# Patient Record
Sex: Female | Born: 1980 | Race: Black or African American | Hispanic: No | Marital: Single | Smoking: Never smoker
Health system: Southern US, Community
[De-identification: ages and names within clinical notes are randomized; demographics above are authoritative.]

## PROBLEM LIST (undated history)

## (undated) DIAGNOSIS — F988 Other specified behavioral and emotional disorders with onset usually occurring in childhood and adolescence: Secondary | ICD-10-CM

## (undated) DIAGNOSIS — A549 Gonococcal infection, unspecified: Secondary | ICD-10-CM

## (undated) DIAGNOSIS — B379 Candidiasis, unspecified: Secondary | ICD-10-CM

## (undated) DIAGNOSIS — M797 Fibromyalgia: Secondary | ICD-10-CM

## (undated) DIAGNOSIS — N760 Acute vaginitis: Secondary | ICD-10-CM

## (undated) DIAGNOSIS — D219 Benign neoplasm of connective and other soft tissue, unspecified: Secondary | ICD-10-CM

## (undated) DIAGNOSIS — A749 Chlamydial infection, unspecified: Secondary | ICD-10-CM

## (undated) DIAGNOSIS — B9689 Other specified bacterial agents as the cause of diseases classified elsewhere: Secondary | ICD-10-CM

## (undated) DIAGNOSIS — M779 Enthesopathy, unspecified: Secondary | ICD-10-CM

## (undated) DIAGNOSIS — Z8619 Personal history of other infectious and parasitic diseases: Secondary | ICD-10-CM

## (undated) DIAGNOSIS — R87629 Unspecified abnormal cytological findings in specimens from vagina: Secondary | ICD-10-CM

## (undated) DIAGNOSIS — IMO0002 Reserved for concepts with insufficient information to code with codable children: Secondary | ICD-10-CM

## (undated) DIAGNOSIS — M719 Bursopathy, unspecified: Secondary | ICD-10-CM

## (undated) DIAGNOSIS — D649 Anemia, unspecified: Secondary | ICD-10-CM

## (undated) DIAGNOSIS — A599 Trichomoniasis, unspecified: Secondary | ICD-10-CM

## (undated) DIAGNOSIS — R87619 Unspecified abnormal cytological findings in specimens from cervix uteri: Secondary | ICD-10-CM

## (undated) HISTORY — DX: Gonococcal infection, unspecified: A54.9

## (undated) HISTORY — DX: Other specified behavioral and emotional disorders with onset usually occurring in childhood and adolescence: F98.8

## (undated) HISTORY — PX: WISDOM TOOTH EXTRACTION: SHX21

## (undated) HISTORY — DX: Anemia, unspecified: D64.9

## (undated) HISTORY — DX: Benign neoplasm of connective and other soft tissue, unspecified: D21.9

## (undated) HISTORY — DX: Other specified bacterial agents as the cause of diseases classified elsewhere: B96.89

## (undated) HISTORY — DX: Other specified bacterial agents as the cause of diseases classified elsewhere: N76.0

## (undated) HISTORY — DX: Personal history of other infectious and parasitic diseases: Z86.19

## (undated) HISTORY — DX: Trichomoniasis, unspecified: A59.9

## (undated) HISTORY — DX: Bursopathy, unspecified: M71.9

## (undated) HISTORY — DX: Enthesopathy, unspecified: M77.9

## (undated) HISTORY — DX: Candidiasis, unspecified: B37.9

## (undated) HISTORY — DX: Chlamydial infection, unspecified: A74.9

---

## 1999-02-08 ENCOUNTER — Other Ambulatory Visit: Admission: RE | Admit: 1999-02-08 | Discharge: 1999-02-08 | Payer: Self-pay | Admitting: Obstetrics and Gynecology

## 1999-11-16 ENCOUNTER — Encounter: Payer: Self-pay | Admitting: Emergency Medicine

## 1999-11-16 ENCOUNTER — Emergency Department (HOSPITAL_COMMUNITY): Admission: EM | Admit: 1999-11-16 | Discharge: 1999-11-16 | Payer: Self-pay | Admitting: Emergency Medicine

## 2000-02-21 ENCOUNTER — Other Ambulatory Visit: Admission: RE | Admit: 2000-02-21 | Discharge: 2000-02-21 | Payer: Self-pay | Admitting: Obstetrics and Gynecology

## 2000-02-21 ENCOUNTER — Other Ambulatory Visit: Admission: RE | Admit: 2000-02-21 | Discharge: 2000-02-21 | Payer: Self-pay

## 2000-04-11 ENCOUNTER — Ambulatory Visit (HOSPITAL_COMMUNITY): Admission: RE | Admit: 2000-04-11 | Discharge: 2000-04-11 | Payer: Self-pay | Admitting: Orthopedic Surgery

## 2000-04-11 ENCOUNTER — Encounter: Payer: Self-pay | Admitting: Orthopedic Surgery

## 2001-04-15 ENCOUNTER — Other Ambulatory Visit: Admission: RE | Admit: 2001-04-15 | Discharge: 2001-04-15 | Payer: Self-pay | Admitting: Obstetrics and Gynecology

## 2001-11-22 ENCOUNTER — Emergency Department (HOSPITAL_COMMUNITY): Admission: EM | Admit: 2001-11-22 | Discharge: 2001-11-22 | Payer: Self-pay | Admitting: Emergency Medicine

## 2001-11-23 ENCOUNTER — Ambulatory Visit (HOSPITAL_COMMUNITY): Admission: RE | Admit: 2001-11-23 | Discharge: 2001-11-23 | Payer: Self-pay | Admitting: Emergency Medicine

## 2001-11-23 ENCOUNTER — Encounter: Payer: Self-pay | Admitting: Emergency Medicine

## 2002-04-15 ENCOUNTER — Other Ambulatory Visit: Admission: RE | Admit: 2002-04-15 | Discharge: 2002-04-15 | Payer: Self-pay | Admitting: Obstetrics and Gynecology

## 2002-11-22 ENCOUNTER — Inpatient Hospital Stay (HOSPITAL_COMMUNITY): Admission: AD | Admit: 2002-11-22 | Discharge: 2002-11-22 | Payer: Self-pay | Admitting: Obstetrics & Gynecology

## 2003-07-06 ENCOUNTER — Emergency Department (HOSPITAL_COMMUNITY): Admission: EM | Admit: 2003-07-06 | Discharge: 2003-07-06 | Payer: Self-pay | Admitting: Emergency Medicine

## 2003-10-26 ENCOUNTER — Emergency Department (HOSPITAL_COMMUNITY): Admission: EM | Admit: 2003-10-26 | Discharge: 2003-10-26 | Payer: Self-pay | Admitting: Emergency Medicine

## 2003-11-02 ENCOUNTER — Emergency Department (HOSPITAL_COMMUNITY): Admission: EM | Admit: 2003-11-02 | Discharge: 2003-11-02 | Payer: Self-pay | Admitting: Emergency Medicine

## 2004-12-28 ENCOUNTER — Other Ambulatory Visit: Admission: RE | Admit: 2004-12-28 | Discharge: 2004-12-28 | Payer: Self-pay | Admitting: Obstetrics and Gynecology

## 2004-12-29 ENCOUNTER — Other Ambulatory Visit: Admission: RE | Admit: 2004-12-29 | Discharge: 2004-12-29 | Payer: Self-pay | Admitting: Obstetrics and Gynecology

## 2005-08-07 ENCOUNTER — Encounter: Admission: RE | Admit: 2005-08-07 | Discharge: 2005-08-07 | Payer: Self-pay | Admitting: Obstetrics and Gynecology
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
## 2006-09-19 ENCOUNTER — Emergency Department (HOSPITAL_COMMUNITY): Admission: EM | Admit: 2006-09-19 | Discharge: 2006-09-20 | Payer: Self-pay | Admitting: Emergency Medicine

## 2007-06-29 ENCOUNTER — Telehealth (INDEPENDENT_AMBULATORY_CARE_PROVIDER_SITE_OTHER): Payer: Self-pay | Admitting: Nurse Practitioner

## 2007-07-02 ENCOUNTER — Ambulatory Visit: Payer: Self-pay | Admitting: Nurse Practitioner

## 2007-07-02 DIAGNOSIS — M79609 Pain in unspecified limb: Secondary | ICD-10-CM

## 2007-07-02 DIAGNOSIS — K029 Dental caries, unspecified: Secondary | ICD-10-CM | POA: Insufficient documentation

## 2007-07-02 DIAGNOSIS — G43909 Migraine, unspecified, not intractable, without status migrainosus: Secondary | ICD-10-CM | POA: Insufficient documentation

## 2007-07-30 ENCOUNTER — Ambulatory Visit: Payer: Self-pay | Admitting: Nurse Practitioner

## 2007-07-30 DIAGNOSIS — M549 Dorsalgia, unspecified: Secondary | ICD-10-CM | POA: Insufficient documentation

## 2007-07-30 DIAGNOSIS — B373 Candidiasis of vulva and vagina: Secondary | ICD-10-CM

## 2007-07-30 DIAGNOSIS — M545 Low back pain: Secondary | ICD-10-CM

## 2007-07-30 LAB — CONVERTED CEMR LAB
Blood in Urine, dipstick: NEGATIVE
Glucose, Urine, Semiquant: NEGATIVE
KOH Prep: NEGATIVE
Ketones, urine, test strip: NEGATIVE
Nitrite: NEGATIVE
Specific Gravity, Urine: <1.005
pH: 6.5

## 2007-08-02 LAB — CONVERTED CEMR LAB
AST: 19 units/L (ref 0–37)
Albumin: 4.5 g/dL (ref 3.5–5.2)
Basophils Absolute: 0 10*3/uL (ref 0.0–0.1)
Glucose, Bld: 85 mg/dL (ref 70–99)
HDL: 47 mg/dL (ref >39)
Hemoglobin: 12.3 g/dL (ref 12.0–15.0)
LDL Cholesterol: 100 mg/dL — ABNORMAL HIGH (ref 0–99)
Lymphs Abs: 2.5 10*3/uL (ref 0.7–4.0)
MCV: 93.5 fL (ref 78.0–100.0)
Monocytes Absolute: 0.6 10*3/uL (ref 0.1–1.0)
Neutro Abs: 8.2 10*3/uL — ABNORMAL HIGH (ref 1.7–7.7)
RBC: 4.33 M/uL (ref 3.87–5.11)
RDW: 12.6 % (ref 11.5–15.5)
Total CHOL/HDL Ratio: 3.4
Triglycerides: 70 mg/dL (ref <150)

## 2007-08-03 ENCOUNTER — Ambulatory Visit: Payer: Self-pay | Admitting: *Deleted

## 2007-08-03 ENCOUNTER — Encounter (INDEPENDENT_AMBULATORY_CARE_PROVIDER_SITE_OTHER): Payer: Self-pay | Admitting: Nurse Practitioner

## 2007-08-03 ENCOUNTER — Encounter: Admission: RE | Admit: 2007-08-03 | Discharge: 2007-08-27 | Payer: Self-pay | Admitting: Nurse Practitioner

## 2007-08-07 ENCOUNTER — Encounter (INDEPENDENT_AMBULATORY_CARE_PROVIDER_SITE_OTHER): Payer: Self-pay | Admitting: Nurse Practitioner

## 2007-08-21 ENCOUNTER — Ambulatory Visit: Payer: Self-pay | Admitting: Nurse Practitioner

## 2007-08-21 DIAGNOSIS — R8789 Other abnormal findings in specimens from female genital organs: Secondary | ICD-10-CM | POA: Insufficient documentation

## 2007-08-24 ENCOUNTER — Telehealth (INDEPENDENT_AMBULATORY_CARE_PROVIDER_SITE_OTHER): Payer: Self-pay | Admitting: Nurse Practitioner

## 2007-08-25 ENCOUNTER — Encounter (INDEPENDENT_AMBULATORY_CARE_PROVIDER_SITE_OTHER): Payer: Self-pay | Admitting: Nurse Practitioner

## 2007-09-16 ENCOUNTER — Other Ambulatory Visit: Admission: RE | Admit: 2007-09-16 | Discharge: 2007-09-16 | Payer: Self-pay | Admitting: Obstetrics and Gynecology

## 2007-09-16 ENCOUNTER — Ambulatory Visit: Payer: Self-pay | Admitting: Obstetrics and Gynecology

## 2007-10-08 ENCOUNTER — Ambulatory Visit: Payer: Self-pay | Admitting: Obstetrics and Gynecology

## 2007-12-21 ENCOUNTER — Emergency Department (HOSPITAL_COMMUNITY): Admission: EM | Admit: 2007-12-21 | Discharge: 2007-12-21 | Payer: Self-pay | Admitting: Emergency Medicine

## 2008-01-08 ENCOUNTER — Ambulatory Visit: Payer: Self-pay | Admitting: Nurse Practitioner

## 2008-01-08 DIAGNOSIS — N760 Acute vaginitis: Secondary | ICD-10-CM | POA: Insufficient documentation

## 2008-01-08 LAB — CONVERTED CEMR LAB
Glucose, Urine, Semiquant: NEGATIVE
KOH Prep: NEGATIVE
Ketones, urine, test strip: NEGATIVE
Protein, U semiquant: NEGATIVE
Specific Gravity, Urine: >=1.03
Urobilinogen, UA: 0.2
WBC Urine, dipstick: NEGATIVE
pH: 5.5

## 2008-03-11 ENCOUNTER — Ambulatory Visit: Payer: Self-pay | Admitting: Nurse Practitioner

## 2008-03-11 DIAGNOSIS — R5383 Other fatigue: Secondary | ICD-10-CM

## 2008-03-11 DIAGNOSIS — R5381 Other malaise: Secondary | ICD-10-CM

## 2008-03-11 DIAGNOSIS — N3289 Other specified disorders of bladder: Secondary | ICD-10-CM

## 2008-03-11 LAB — CONVERTED CEMR LAB
Beta hcg, urine, semiquantitative: NEGATIVE
Blood in Urine, dipstick: NEGATIVE
Glucose, Urine, Semiquant: NEGATIVE
Ketones, urine, test strip: NEGATIVE
WBC Urine, dipstick: NEGATIVE
pH: 5

## 2008-03-14 LAB — CONVERTED CEMR LAB
HCT: 38.9 % (ref 36.0–46.0)
Platelets: 338 10*3/uL (ref 150–400)
RDW: 12.7 % (ref 11.5–15.5)
Retic Ct Pct: 0.9 % (ref 0.4–3.1)

## 2008-03-16 ENCOUNTER — Telehealth (INDEPENDENT_AMBULATORY_CARE_PROVIDER_SITE_OTHER): Payer: Self-pay | Admitting: Nurse Practitioner

## 2008-03-24 ENCOUNTER — Encounter (INDEPENDENT_AMBULATORY_CARE_PROVIDER_SITE_OTHER): Payer: Self-pay | Admitting: Family Medicine

## 2008-03-24 ENCOUNTER — Ambulatory Visit: Payer: Self-pay | Admitting: Family Medicine

## 2008-06-03 ENCOUNTER — Ambulatory Visit: Payer: Self-pay | Admitting: Nurse Practitioner

## 2008-06-03 DIAGNOSIS — E559 Vitamin D deficiency, unspecified: Secondary | ICD-10-CM | POA: Insufficient documentation

## 2008-06-06 ENCOUNTER — Encounter (INDEPENDENT_AMBULATORY_CARE_PROVIDER_SITE_OTHER): Payer: Self-pay | Admitting: Nurse Practitioner

## 2008-07-07 ENCOUNTER — Emergency Department (HOSPITAL_COMMUNITY): Admission: EM | Admit: 2008-07-07 | Discharge: 2008-07-07 | Payer: Self-pay | Admitting: Family Medicine

## 2008-09-12 ENCOUNTER — Ambulatory Visit: Payer: Self-pay | Admitting: Nurse Practitioner

## 2008-09-12 DIAGNOSIS — R109 Unspecified abdominal pain: Secondary | ICD-10-CM | POA: Insufficient documentation

## 2008-09-12 DIAGNOSIS — N6019 Diffuse cystic mastopathy of unspecified breast: Secondary | ICD-10-CM | POA: Insufficient documentation

## 2008-09-12 LAB — CONVERTED CEMR LAB
Beta hcg, urine, semiquantitative: NEGATIVE
Bilirubin Urine: NEGATIVE
Blood in Urine, dipstick: NEGATIVE
Chlamydia, Swab/Urine, PCR: NEGATIVE
Ketones, urine, test strip: NEGATIVE
Nitrite: NEGATIVE
Protein, U semiquant: NEGATIVE
WBC Urine, dipstick: NEGATIVE

## 2008-09-13 ENCOUNTER — Encounter (INDEPENDENT_AMBULATORY_CARE_PROVIDER_SITE_OTHER): Payer: Self-pay | Admitting: Nurse Practitioner

## 2008-09-29 ENCOUNTER — Ambulatory Visit: Payer: Self-pay | Admitting: Obstetrics and Gynecology

## 2008-09-29 ENCOUNTER — Encounter: Payer: Self-pay | Admitting: Obstetrics and Gynecology

## 2009-02-06 ENCOUNTER — Emergency Department (HOSPITAL_COMMUNITY): Admission: EM | Admit: 2009-02-06 | Discharge: 2009-02-06 | Payer: Self-pay | Admitting: Emergency Medicine

## 2009-03-16 ENCOUNTER — Emergency Department (HOSPITAL_COMMUNITY): Admission: EM | Admit: 2009-03-16 | Discharge: 2009-03-16 | Payer: Self-pay | Admitting: Emergency Medicine

## 2009-03-27 ENCOUNTER — Emergency Department (HOSPITAL_COMMUNITY): Admission: EM | Admit: 2009-03-27 | Discharge: 2009-03-27 | Payer: Self-pay | Admitting: Emergency Medicine

## 2009-07-19 ENCOUNTER — Ambulatory Visit: Payer: Self-pay | Admitting: Nurse Practitioner

## 2009-07-19 DIAGNOSIS — L293 Anogenital pruritus, unspecified: Secondary | ICD-10-CM

## 2010-02-22 NOTE — Assessment & Plan Note (Signed)
Summary: Vaginal irritation   Vital Signs:  Patient profile:   30 year old female LMP:     06/25/2009 Weight:      139.7 pounds BMI:     24.07 Temp:     98.0 degrees F oral Pulse rate:   103 / minute Pulse rhythm:   regular Resp:     20 per minute BP sitting:   119 / 76  (left arm) Cuff size:   regular  Vitals Entered By: Levon Hedger (July 19, 2009 4:00 PM) CC: an area in crotch that is itchy and irritated....it does not always itch but is always there and wants to know why it is there. Is Patient Diabetic? No Pain Assessment Patient in pain? no       Does patient need assistance? Functional Status Self care Ambulation Normal LMP (date): 06/25/2009 LMP - Character: light     Enter LMP: 06/25/2009 Last PAP Result NEGATIVE FOR INTRAEPITHELIAL LESIONS OR MALIGNANCY.   CC:  an area in crotch that is itchy and irritated....it does not always itch but is always there and wants to know why it is there.Marland Kitchen  History of Present Illness:  Pt into the office with complaints of vaginal irritation Has to wash twice per day to keep it from itching Area of concern of has been present for several months Friction makes it worse, the more it itches the more it gets irritated no vaginal discharge no urinary problems  Allergies (verified): No Known Drug Allergies  Review of Systems General:  Denies fever. CV:  Denies chest pain or discomfort. Resp:  Denies cough. GI:  Denies abdominal pain, nausea, and vomiting. GU:  Denies dysuria and urinary frequency; vaginal irritation.  Physical Exam  General:  alert.   Head:  normocephalic.   Genitalia:  lower vaginal with discoloration, some irritation Msk:  normal ROM.   Neurologic:  alert & oriented X3.   Skin:  color normal.   Psych:  Oriented X3.     Impression & Recommendations:  Problem # 1:  VAGINAL PRURITUS (ICD-698.1) advised pt that symptoms may be fungal no discharge or dysuria  Complete Medication List: 1)   Ibuprofen 400 Mg Tabs (Ibuprofen) 2)  Cyclobenzaprine Hcl 10 Mg Tabs (Cyclobenzaprine hcl) .... One tablet by mouth daily as needed for muscles 3)  Nystatin-triamcinolone 100000-0.1 Unit/gm-% Crea (Nystatin-triamcinolone) .... One application topically two times a day to affected area  Patient Instructions: 1)  Vaginal irritation may be due fungus, contact dermatitis or eczema. 2)  Use the cream as needed to affected area Prescriptions: NYSTATIN-TRIAMCINOLONE 100000-0.1 UNIT/GM-% CREA (NYSTATIN-TRIAMCINOLONE) One application topically two times a day to affected area  #60gm x 0   Entered and Authorized by:   Lehman Prom FNP   Signed by:   Lehman Prom FNP on 07/19/2009   Method used:   Print then Give to Patient   RxID:   [id number] NYSTATIN-TRIAMCINOLONE 100000-0.1 UNIT/GM-% OINT (NYSTATIN-TRIAMCINOLONE) Apply to affected area two times a day as needed  #60gm x 0   Entered and Authorized by:   Lehman Prom FNP   Signed by:   Lehman Prom FNP on 07/19/2009   Method used:   Print then Give to Patient   RxID:   [id number]

## 2010-04-13 ENCOUNTER — Other Ambulatory Visit: Payer: Self-pay | Admitting: *Deleted

## 2010-04-13 DIAGNOSIS — M79606 Pain in leg, unspecified: Secondary | ICD-10-CM

## 2010-04-13 LAB — URINE MICROSCOPIC-ADD ON

## 2010-04-13 LAB — POCT I-STAT, CHEM 8
Potassium: 3.9 mEq/L (ref 3.5–5.1)
TCO2: 23 mmol/L (ref 0–100)

## 2010-04-13 LAB — WET PREP, GENITAL
Trich, Wet Prep: NONE SEEN
Yeast Wet Prep HPF POC: NONE SEEN

## 2010-04-13 LAB — URINALYSIS, ROUTINE W REFLEX MICROSCOPIC
Ketones, ur: NEGATIVE mg/dL
Nitrite: NEGATIVE
Specific Gravity, Urine: 1.011 (ref 1.005–1.030)

## 2010-04-13 LAB — GC/CHLAMYDIA PROBE AMP, GENITAL: Chlamydia, DNA Probe: NEGATIVE

## 2010-04-16 ENCOUNTER — Other Ambulatory Visit: Payer: BC Managed Care – PPO

## 2010-04-16 ENCOUNTER — Other Ambulatory Visit: Payer: Self-pay | Admitting: Chiropractor

## 2010-04-16 DIAGNOSIS — M79606 Pain in leg, unspecified: Secondary | ICD-10-CM

## 2010-04-27 LAB — POCT URINALYSIS DIP (DEVICE)
Glucose, UA: NEGATIVE mg/dL
Ketones, ur: NEGATIVE mg/dL
Nitrite: NEGATIVE
pH: 5.5 (ref 5.0–8.0)

## 2010-05-03 LAB — POCT URINALYSIS DIP (DEVICE)
Bilirubin Urine: NEGATIVE
Glucose, UA: NEGATIVE mg/dL
Hgb urine dipstick: NEGATIVE
Ketones, ur: NEGATIVE mg/dL
Nitrite: NEGATIVE
Protein, ur: NEGATIVE mg/dL
Specific Gravity, Urine: 1.015 (ref 1.005–1.030)
Urobilinogen, UA: 1 mg/dL (ref 0.0–1.0)
pH: 5.5 (ref 5.0–8.0)

## 2010-06-07 ENCOUNTER — Emergency Department (HOSPITAL_COMMUNITY)
Admission: EM | Admit: 2010-06-07 | Discharge: 2010-06-07 | Disposition: A | Discharge status: Home / Self Care | Payer: BC Managed Care – PPO | Attending: Emergency Medicine | Admitting: Emergency Medicine

## 2010-06-07 DIAGNOSIS — M79609 Pain in unspecified limb: Secondary | ICD-10-CM | POA: Insufficient documentation

## 2010-06-07 DIAGNOSIS — Z79899 Other long term (current) drug therapy: Secondary | ICD-10-CM | POA: Insufficient documentation

## 2010-06-07 DIAGNOSIS — M545 Low back pain, unspecified: Secondary | ICD-10-CM | POA: Insufficient documentation

## 2010-06-07 DIAGNOSIS — G8929 Other chronic pain: Secondary | ICD-10-CM | POA: Insufficient documentation

## 2010-09-04 LAB — ANTIBODY SCREEN: Antibody Screen: NEGATIVE

## 2010-09-04 LAB — ABO/RH: RH Type: POSITIVE

## 2010-09-04 LAB — GC/CHLAMYDIA PROBE AMP, GENITAL

## 2010-09-04 LAB — RPR: RPR: NONREACTIVE

## 2010-09-04 LAB — HIV ANTIBODY (ROUTINE TESTING W REFLEX): HIV: NONREACTIVE

## 2011-01-23 ENCOUNTER — Encounter (INDEPENDENT_AMBULATORY_CARE_PROVIDER_SITE_OTHER): Payer: Self-pay | Admitting: General Surgery

## 2011-01-24 ENCOUNTER — Ambulatory Visit (INDEPENDENT_AMBULATORY_CARE_PROVIDER_SITE_OTHER): Payer: BC Managed Care – PPO | Admitting: Surgery

## 2011-01-24 ENCOUNTER — Encounter (INDEPENDENT_AMBULATORY_CARE_PROVIDER_SITE_OTHER): Payer: Self-pay | Admitting: Surgery

## 2011-01-24 VITALS — BP 108/64 | HR 97 | Temp 98.8°F | Ht 63.0 in | Wt 144.6 lb

## 2011-01-24 DIAGNOSIS — K429 Umbilical hernia without obstruction or gangrene: Secondary | ICD-10-CM | POA: Insufficient documentation

## 2011-01-24 NOTE — Patient Instructions (Signed)
Return in 6 months

## 2011-01-24 NOTE — Progress Notes (Signed)
Patient ID: Marcia Bowers, female   DOB: 1980/06/27, 31 y.o.   MRN: [id number]  Chief Complaint  Patient presents with  . Pre-op Exam    eval umb and RIH    HPI Marcia Bowers is a 31 y.o. female.   HPI The patient presents at the request of Dr. Normand Sloop due to a bulge at the umbilicus and in both groins. She is 7 months pregnant. The bulges do not cause pain. She does have pressure in her groin region especially when she has to urinate. She denies any significant abdominal pain except that expected during pregnancy. No change in bowel habits. These areas remained soft and nontender.  Past Medical History  Diagnosis Date  . Anemia     History reviewed. No pertinent past surgical history.  History reviewed. No pertinent family history.  Social History History  Substance Use Topics  . Smoking status: Never Smoker   . Smokeless tobacco: Not on file  . Alcohol Use: Yes     1x month    No Known Allergies  Current Outpatient Prescriptions  Medication Sig Dispense Refill  . Calcium Carbonate-Vitamin D (CALCIUM-VITAMIN D) 500-200 MG-UNIT per tablet Take 1 tablet by mouth 2 (two) times daily with a meal.        . docusate sodium (COLACE) 100 MG capsule Take 100 mg by mouth 2 (two) times daily.        . polysaccharide iron complex (NIFEREX) 100 MG/5ML elixir Take by mouth daily.        . Prenatal Vit-Fe Fumarate-FA (MULTIVITAMIN-PRENATAL) 27-0.8 MG TABS Take 1 tablet by mouth daily.          Review of Systems Review of Systems  Constitutional: Positive for fatigue.  HENT: Negative.   Eyes: Negative.   Respiratory: Negative.   Cardiovascular: Negative.   Genitourinary: Negative.   Musculoskeletal: Negative.   Neurological: Negative.   Hematological: Negative.   Psychiatric/Behavioral: Negative.     Blood pressure 108/64, pulse 97, temperature 98.8 F (37.1 C), temperature source Temporal, height 5\' 3"  (1.6 m), weight 144 lb 9.6 oz (65.59 kg), SpO2 98.00%.  Physical  Exam Physical Exam  Constitutional: She is oriented to person, place, and time. She appears well-developed and well-nourished.  HENT:  Head: Normocephalic and atraumatic.  Eyes: EOM are normal. Pupils are equal, round, and reactive to light.  Neck: Normal range of motion. Neck supple.  Abdominal:       Gravid  Small reducible umbilical hernia.  No inguinal hernia Bilaterally.  Soft nontender  Musculoskeletal: Normal range of motion.  Neurological: She is alert and oriented to person, place, and time.  Skin: Skin is warm and dry.  Psychiatric: She has a normal mood and affect. Her behavior is normal. Judgment and thought content normal.    Data Reviewed   Assessment      Small umbilical hernia reducible Gravid    Plan    Return in 6 months.  No surgical indication for repair at this point.       Eather Chaires A. 01/24/2011, 12:11 PM

## 2011-03-22 ENCOUNTER — Encounter (INDEPENDENT_AMBULATORY_CARE_PROVIDER_SITE_OTHER): Payer: Medicaid Other | Admitting: Obstetrics and Gynecology

## 2011-03-22 DIAGNOSIS — Z331 Pregnant state, incidental: Secondary | ICD-10-CM

## 2011-03-27 ENCOUNTER — Encounter (INDEPENDENT_AMBULATORY_CARE_PROVIDER_SITE_OTHER): Payer: Medicaid Other | Admitting: Obstetrics and Gynecology

## 2011-03-27 DIAGNOSIS — Z331 Pregnant state, incidental: Secondary | ICD-10-CM

## 2011-04-03 ENCOUNTER — Encounter (INDEPENDENT_AMBULATORY_CARE_PROVIDER_SITE_OTHER): Payer: Medicaid Other | Admitting: Obstetrics and Gynecology

## 2011-04-03 DIAGNOSIS — Z331 Pregnant state, incidental: Secondary | ICD-10-CM

## 2011-04-11 ENCOUNTER — Encounter (INDEPENDENT_AMBULATORY_CARE_PROVIDER_SITE_OTHER): Payer: Medicaid Other | Admitting: Obstetrics and Gynecology

## 2011-04-11 DIAGNOSIS — O480 Post-term pregnancy: Secondary | ICD-10-CM

## 2011-04-11 DIAGNOSIS — O99019 Anemia complicating pregnancy, unspecified trimester: Secondary | ICD-10-CM

## 2011-04-12 ENCOUNTER — Inpatient Hospital Stay (HOSPITAL_COMMUNITY)
Admission: AD | Admit: 2011-04-12 | Discharge: 2011-04-16 | DRG: 765 | Disposition: A | Discharge status: Home / Self Care | Payer: PRIVATE HEALTH INSURANCE | Source: Ambulatory Visit | Attending: Obstetrics and Gynecology | Admitting: Obstetrics and Gynecology

## 2011-04-12 ENCOUNTER — Encounter (HOSPITAL_COMMUNITY): Payer: Self-pay

## 2011-04-12 DIAGNOSIS — Z98891 History of uterine scar from previous surgery: Secondary | ICD-10-CM

## 2011-04-12 DIAGNOSIS — O9903 Anemia complicating the puerperium: Secondary | ICD-10-CM | POA: Diagnosis not present

## 2011-04-12 DIAGNOSIS — O2690 Pregnancy related conditions, unspecified, unspecified trimester: Secondary | ICD-10-CM | POA: Diagnosis present

## 2011-04-12 DIAGNOSIS — IMO0001 Reserved for inherently not codable concepts without codable children: Secondary | ICD-10-CM

## 2011-04-12 DIAGNOSIS — O411090 Infection of amniotic sac and membranes, unspecified, unspecified trimester, not applicable or unspecified: Secondary | ICD-10-CM | POA: Diagnosis not present

## 2011-04-12 DIAGNOSIS — IMO0002 Reserved for concepts with insufficient information to code with codable children: Secondary | ICD-10-CM

## 2011-04-12 DIAGNOSIS — O324XX0 Maternal care for high head at term, not applicable or unspecified: Secondary | ICD-10-CM | POA: Diagnosis present

## 2011-04-12 DIAGNOSIS — D649 Anemia, unspecified: Secondary | ICD-10-CM | POA: Diagnosis not present

## 2011-04-12 DIAGNOSIS — O479 False labor, unspecified: Secondary | ICD-10-CM | POA: Diagnosis present

## 2011-04-12 HISTORY — DX: Reserved for concepts with insufficient information to code with codable children: IMO0002

## 2011-04-12 HISTORY — DX: Unspecified abnormal cytological findings in specimens from cervix uteri: R87.619

## 2011-04-12 LAB — CBC
HCT: 37.9 % (ref 36.0–46.0)
MCH: 30.4 pg (ref 26.0–34.0)
MCHC: 32.2 g/dL (ref 30.0–36.0)
MCV: 94.5 fL (ref 78.0–100.0)
Platelets: 223 10*3/uL (ref 150–400)
RDW: 13.2 % (ref 11.5–15.5)
WBC: 21.6 10*3/uL — ABNORMAL HIGH (ref 4.0–10.5)

## 2011-04-12 MED ORDER — BUTORPHANOL TARTRATE 2 MG/ML IJ SOLN
1.0000 mg | INTRAMUSCULAR | Status: DC | PRN
  Administered 2011-04-12 (×4): 1 mg via INTRAVENOUS
  Filled 2011-04-12 (×5): qty 1

## 2011-04-12 MED ORDER — LACTATED RINGERS IV SOLN
INTRAVENOUS | Status: DC
  Administered 2011-04-12 – 2011-04-13 (×4): via INTRAVENOUS

## 2011-04-12 MED ORDER — LIDOCAINE HCL (PF) 1 % IJ SOLN
30.0000 mL | INTRAMUSCULAR | Status: DC | PRN
  Filled 2011-04-12: qty 30

## 2011-04-12 MED ORDER — LIDOCAINE HCL (PF) 1 % IJ SOLN
INTRAMUSCULAR | Status: DC | PRN
  Administered 2011-04-12 (×3): 4 mL

## 2011-04-12 MED ORDER — LACTATED RINGERS IV SOLN
500.0000 mL | INTRAVENOUS | Status: DC | PRN
  Administered 2011-04-13: 500 mL via INTRAVENOUS

## 2011-04-12 MED ORDER — EPHEDRINE 5 MG/ML INJ: 10.0000 mg | INTRAVENOUS | Status: DC | PRN

## 2011-04-12 MED ORDER — DIPHENHYDRAMINE HCL 50 MG/ML IJ SOLN
12.5000 mg | INTRAMUSCULAR | Status: DC | PRN
Start: 2011-04-12 — End: 2011-04-12

## 2011-04-12 MED ORDER — FLEET ENEMA 7-19 GM/118ML RE ENEM: 1.0000 | ENEMA | RECTAL | Status: DC | PRN

## 2011-04-12 MED ORDER — IBUPROFEN 600 MG PO TABS: 600.0000 mg | ORAL_TABLET | Freq: Four times a day (QID) | ORAL | Status: DC | PRN

## 2011-04-12 MED ORDER — PHENYLEPHRINE 40 MCG/ML (10ML) SYRINGE FOR IV PUSH (FOR BLOOD PRESSURE SUPPORT): 80.0000 ug | PREFILLED_SYRINGE | INTRAVENOUS | Status: DC | PRN

## 2011-04-12 MED ORDER — OXYTOCIN BOLUS FROM INFUSION
500.0000 mL | Freq: Once | INTRAVENOUS | Status: DC
  Filled 2011-04-12: qty 500

## 2011-04-12 MED ORDER — PHENYLEPHRINE 40 MCG/ML (10ML) SYRINGE FOR IV PUSH (FOR BLOOD PRESSURE SUPPORT)
80.0000 ug | PREFILLED_SYRINGE | INTRAVENOUS | Status: DC | PRN
  Filled 2011-04-12: qty 5

## 2011-04-12 MED ORDER — CITRIC ACID-SODIUM CITRATE 334-500 MG/5ML PO SOLN
30.0000 mL | ORAL | Status: DC | PRN
  Filled 2011-04-12: qty 15

## 2011-04-12 MED ORDER — DIPHENHYDRAMINE HCL 50 MG/ML IJ SOLN: 12.5000 mg | INTRAMUSCULAR | Status: DC | PRN

## 2011-04-12 MED ORDER — TERBUTALINE SULFATE 1 MG/ML IJ SOLN: 0.2500 mg | Freq: Once | INTRAMUSCULAR | Status: AC | PRN

## 2011-04-12 MED ORDER — OXYTOCIN 20 UNITS IN LACTATED RINGERS INFUSION - SIMPLE
1.0000 m[IU]/min | INTRAVENOUS | Status: DC
  Administered 2011-04-12: 1 m[IU]/min via INTRAVENOUS
  Filled 2011-04-12: qty 1000

## 2011-04-12 MED ORDER — ACETAMINOPHEN 325 MG PO TABS
650.0000 mg | ORAL_TABLET | ORAL | Status: DC | PRN
  Administered 2011-04-13: 650 mg via ORAL
  Filled 2011-04-12: qty 2

## 2011-04-12 MED ORDER — LACTATED RINGERS IV SOLN: 500.0000 mL | Freq: Once | INTRAVENOUS | Status: DC

## 2011-04-12 MED ORDER — ONDANSETRON HCL 4 MG/2ML IJ SOLN
4.0000 mg | Freq: Four times a day (QID) | INTRAMUSCULAR | Status: DC | PRN
  Administered 2011-04-12: 4 mg via INTRAVENOUS
  Filled 2011-04-12: qty 2

## 2011-04-12 MED ORDER — OXYTOCIN 20 UNITS IN LACTATED RINGERS INFUSION - SIMPLE: 125.0000 mL/h | Freq: Once | INTRAVENOUS | Status: DC

## 2011-04-12 MED ORDER — FENTANYL 2.5 MCG/ML BUPIVACAINE 1/10 % EPIDURAL INFUSION (WH - ANES)
14.0000 mL/h | INTRAMUSCULAR | Status: DC
  Administered 2011-04-12 – 2011-04-13 (×4): 14 mL/h via EPIDURAL
  Filled 2011-04-12 (×4): qty 60

## 2011-04-12 MED ORDER — EPHEDRINE 5 MG/ML INJ
10.0000 mg | INTRAVENOUS | Status: DC | PRN
  Filled 2011-04-12: qty 4

## 2011-04-12 MED ORDER — OXYCODONE-ACETAMINOPHEN 5-325 MG PO TABS: 1.0000 | ORAL_TABLET | ORAL | Status: DC | PRN

## 2011-04-12 MED ORDER — PROMETHAZINE HCL 25 MG/ML IJ SOLN
12.5000 mg | INTRAMUSCULAR | Status: DC | PRN
  Administered 2011-04-12: 12.5 mg via INTRAVENOUS
  Filled 2011-04-12: qty 1

## 2011-04-12 MED ORDER — FENTANYL 2.5 MCG/ML BUPIVACAINE 1/10 % EPIDURAL INFUSION (WH - ANES): 14.0000 mL/h | INTRAMUSCULAR | Status: DC

## 2011-04-12 NOTE — Progress Notes (Signed)
Moans with uc, agrees to IUPC and Pitocin O Fhts 140 LTV min to mod     abd soft between uc     uc q 2-4 mild to mod     Vag 5 90 -1 VTX R mod meconium no capit or molding A not in adequate labor P discussed use of IUPC, pitocin, reviewed options for pain relief, continue care, update Dr. Su Hilt per telephone. Lavera Guise, CNM
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
## 2011-04-12 NOTE — Progress Notes (Signed)
  Subjective: Resting well after Stadol.  Objective: BP 107/64  Pulse 109  Temp(Src) 98.4 F (36.9 C) (Oral)  Resp 20  Ht 5\' 3"  (1.6 m)  Wt 151 lb (68.493 kg)  BMI 26.75 kg/m2  SpO2 100%      FHT:  Category 1 UC:   q 3 min, 90-120 sec in duration  Assessment / Plan" Progressive labor Will continue to observe. Dr. Su Hilt updated.   Nigel Bridgeman 04/12/2011, 6:42 PM

## 2011-04-12 NOTE — Progress Notes (Signed)
Pt standing at bedside, maternal heart rate noted at times when squating or on labor ball. No audible fetal decels. Will cont to adjust FM

## 2011-04-12 NOTE — Progress Notes (Signed)
  Subjective: On birthing ball, c/o continued nausea despite Zofran.  Objective: BP 123/73  Pulse 109  Temp(Src) 98.4 F (36.9 C) (Oral)  Resp 20  Ht 5\' 3"  (1.6 m)  Wt 151 lb (68.493 kg)  BMI 26.75 kg/m2  SpO2 100%      FHT:  Category 1 UC:   q 4 min.  Labs: Lab Results  Component Value Date   WBC 21.6* 04/12/2011   HGB 12.2 04/12/2011   HCT 37.9 04/12/2011   MCV 94.5 04/12/2011   PLT 223 04/12/2011    Assessment / Plan: Spontaneous labor, progressing normally Phenergan IV for nausea, pain medication prn.   Marcia Bowers 04/12/2011, 9:30 AM

## 2011-04-12 NOTE — MAU Note (Signed)
Pt states bloody show noted, ctx's q56minutes apart when she left house. Denies lof.

## 2011-04-12 NOTE — Progress Notes (Addendum)
  Subjective: Requesting IV pain medication--received 1st dose of Stadol at 1:12pm with benefit.  Was 5 at 1pm by RN exam.  Declines epidural.  Objective: BP 108/59  Pulse 101  Temp(Src) 98.4 F (36.9 C) (Oral)  Resp 18  Ht 5\' 3"  (1.6 m)  Wt 151 lb (68.493 kg)  BMI 26.75 kg/m2  SpO2 100%      FHT:  Category 1 UC:   regular, every 3 minutes SVE:   Dilation: 6 Effacement (%): 100 Station: 0 Exam by:: V Krishawna Stiefel CNM AROM of [place]--moderate MSF noted, non-particulate.  Labs: Lab Results  Component Value Date   WBC 21.6* 04/12/2011   HGB 12.2 04/12/2011   HCT 37.9 04/12/2011   MCV 94.5 04/12/2011   PLT 223 04/12/2011    Assessment / Plan: Slow progress, reassuring FHR status. Will continue to observe now that AROM of [place] performed. Anticipate better labor progress now. Pain med prn. Mild leukocytosis without fever--will continue to watch temp.  Carlean Jews 04/12/2011, 5:31 PM

## 2011-04-12 NOTE — Progress Notes (Signed)
2215 late entry: Calm quiet with epidural O VSS      Fhts 130s LTV min early decels      uc q 2-4 25-60 mmHg      Vag not assessed A inadequate uc P continue to titrate IV pitocin, Dr. Su Hilt update at [address] CNM

## 2011-04-12 NOTE — Anesthesia Procedure Notes (Signed)
Epidural Patient location during procedure: OB Start time: 04/12/2011 8:58 PM Reason for block: procedure for pain  Staffing Performed by: anesthesiologist   Preanesthetic Checklist Completed: patient identified, site marked, surgical consent, pre-op evaluation, timeout performed, IV checked, risks and benefits discussed and monitors and equipment checked  Epidural Patient position: sitting Prep: site prepped and draped and DuraPrep Patient monitoring: continuous pulse ox and blood pressure Approach: midline Injection technique: LOR air  Needle:  Needle type: Tuohy  Needle gauge: 17 G Needle length: 9 cm Needle insertion depth: 4 cm Catheter type: closed end flexible Catheter size: 19 Gauge Catheter at skin depth: 9 cm Test dose: negative  Assessment Events: blood not aspirated, injection not painful, no injection resistance, negative IV test and no paresthesia  Additional Notes Discussed risk of headache, infection, bleeding, nerve injury and failed or incomplete block.  Patient voices understanding and wishes to proceed.

## 2011-04-12 NOTE — Anesthesia Preprocedure Evaluation (Signed)

## 2011-04-13 ENCOUNTER — Encounter (HOSPITAL_COMMUNITY): Payer: Self-pay | Admitting: Anesthesiology

## 2011-04-13 ENCOUNTER — Inpatient Hospital Stay (HOSPITAL_COMMUNITY): Payer: PRIVATE HEALTH INSURANCE | Admitting: Anesthesiology

## 2011-04-13 ENCOUNTER — Encounter (HOSPITAL_COMMUNITY): Payer: Self-pay | Admitting: *Deleted

## 2011-04-13 ENCOUNTER — Encounter (HOSPITAL_COMMUNITY)
Admission: AD | Disposition: A | Discharge status: Home / Self Care | Payer: Self-pay | Source: Ambulatory Visit | Attending: Obstetrics and Gynecology

## 2011-04-13 ENCOUNTER — Inpatient Hospital Stay (HOSPITAL_COMMUNITY): Admission: RE | Admit: 2011-04-13 | Payer: Medicaid Other | Source: Ambulatory Visit

## 2011-04-13 DIAGNOSIS — O324XX0 Maternal care for high head at term, not applicable or unspecified: Secondary | ICD-10-CM

## 2011-04-13 DIAGNOSIS — O411090 Infection of amniotic sac and membranes, unspecified, unspecified trimester, not applicable or unspecified: Secondary | ICD-10-CM

## 2011-04-13 SURGERY — Surgical Case
Anesthesia: Regional | Site: Abdomen | Wound class: Clean Contaminated

## 2011-04-13 MED ORDER — SODIUM BICARBONATE 8.4 % IV SOLN
INTRAVENOUS | Status: DC | PRN
  Administered 2011-04-13: 5 mL via EPIDURAL

## 2011-04-13 MED ORDER — MORPHINE SULFATE (PF) 0.5 MG/ML IJ SOLN
INTRAMUSCULAR | Status: DC | PRN
  Administered 2011-04-13: 4 mg via EPIDURAL
  Administered 2011-04-13: 1 mg via INTRAVENOUS

## 2011-04-13 MED ORDER — HYDROMORPHONE HCL PF 1 MG/ML IJ SOLN
INTRAMUSCULAR | Status: DC | PRN
  Administered 2011-04-13 (×2): 0.5 mg via INTRAVENOUS

## 2011-04-13 MED ORDER — KETOROLAC TROMETHAMINE 30 MG/ML IJ SOLN
INTRAMUSCULAR | Status: AC
  Administered 2011-04-13: 30 mg via INTRAVENOUS
  Filled 2011-04-13: qty 1

## 2011-04-13 MED ORDER — KETOROLAC TROMETHAMINE 30 MG/ML IJ SOLN
30.0000 mg | Freq: Four times a day (QID) | INTRAMUSCULAR | Status: AC | PRN
  Filled 2011-04-13: qty 1

## 2011-04-13 MED ORDER — FENTANYL CITRATE 0.05 MG/ML IJ SOLN
INTRAMUSCULAR | Status: DC | PRN
Start: 2011-04-13 — End: 2011-04-13
  Administered 2011-04-13: 25 ug via INTRAVENOUS
  Administered 2011-04-13 (×3): 50 ug via INTRAVENOUS
  Administered 2011-04-13: 25 ug via INTRAVENOUS

## 2011-04-13 MED ORDER — TETANUS-DIPHTH-ACELL PERTUSSIS 5-2.5-18.5 LF-MCG/0.5 IM SUSP
0.5000 mL | Freq: Once | INTRAMUSCULAR | Status: AC
  Administered 2011-04-16: 0.5 mL via INTRAMUSCULAR
  Filled 2011-04-13: qty 0.5

## 2011-04-13 MED ORDER — SODIUM CHLORIDE 0.9 % IR SOLN
Status: DC | PRN
  Administered 2011-04-13: 1000 mL

## 2011-04-13 MED ORDER — MIDAZOLAM HCL 2 MG/2ML IJ SOLN: 0.5000 mg | Freq: Once | INTRAMUSCULAR | Status: DC | PRN

## 2011-04-13 MED ORDER — SIMETHICONE 80 MG PO CHEW
80.0000 mg | CHEWABLE_TABLET | Freq: Three times a day (TID) | ORAL | Status: DC
  Administered 2011-04-13 – 2011-04-16 (×10): 80 mg via ORAL

## 2011-04-13 MED ORDER — ACETAMINOPHEN 325 MG PO TABS: 325.0000 mg | ORAL_TABLET | ORAL | Status: DC | PRN

## 2011-04-13 MED ORDER — OXYTOCIN 10 UNIT/ML IJ SOLN
INTRAMUSCULAR | Status: DC | PRN
  Administered 2011-04-13 (×2): 20 [IU]

## 2011-04-13 MED ORDER — NALOXONE HCL 0.4 MG/ML IJ SOLN: 0.4000 mg | INTRAMUSCULAR | Status: DC | PRN

## 2011-04-13 MED ORDER — OXYTOCIN 10 UNIT/ML IJ SOLN
INTRAMUSCULAR | Status: AC
  Filled 2011-04-13: qty 4

## 2011-04-13 MED ORDER — ONDANSETRON HCL 4 MG/2ML IJ SOLN
INTRAMUSCULAR | Status: AC
  Filled 2011-04-13: qty 2

## 2011-04-13 MED ORDER — NALBUPHINE HCL 10 MG/ML IJ SOLN
5.0000 mg | INTRAMUSCULAR | Status: DC | PRN
  Administered 2011-04-13: 10 mg via INTRAVENOUS
  Filled 2011-04-13 (×2): qty 1

## 2011-04-13 MED ORDER — ONDANSETRON HCL 4 MG PO TABS: 4.0000 mg | ORAL_TABLET | ORAL | Status: DC | PRN

## 2011-04-13 MED ORDER — MENTHOL 3 MG MT LOZG: 1.0000 | LOZENGE | OROMUCOSAL | Status: DC | PRN

## 2011-04-13 MED ORDER — FENTANYL CITRATE 0.05 MG/ML IJ SOLN: 25.0000 ug | INTRAMUSCULAR | Status: DC | PRN

## 2011-04-13 MED ORDER — ACETAMINOPHEN 10 MG/ML IV SOLN
1000.0000 mg | Freq: Four times a day (QID) | INTRAVENOUS | Status: AC | PRN
  Filled 2011-04-13: qty 100

## 2011-04-13 MED ORDER — METOCLOPRAMIDE HCL 5 MG/ML IJ SOLN: 10.0000 mg | Freq: Three times a day (TID) | INTRAMUSCULAR | Status: DC | PRN

## 2011-04-13 MED ORDER — HYDROMORPHONE HCL PF 1 MG/ML IJ SOLN
INTRAMUSCULAR | Status: AC
  Filled 2011-04-13: qty 1

## 2011-04-13 MED ORDER — LACTATED RINGERS IV SOLN
INTRAVENOUS | Status: DC
  Administered 2011-04-13 – 2011-04-14 (×3): via INTRAVENOUS

## 2011-04-13 MED ORDER — WITCH HAZEL-GLYCERIN EX PADS: 1.0000 "application " | MEDICATED_PAD | CUTANEOUS | Status: DC | PRN

## 2011-04-13 MED ORDER — SENNOSIDES-DOCUSATE SODIUM 8.6-50 MG PO TABS
2.0000 | ORAL_TABLET | Freq: Every day | ORAL | Status: DC
  Administered 2011-04-13 – 2011-04-15 (×4): 2 via ORAL

## 2011-04-13 MED ORDER — LIDOCAINE-EPINEPHRINE (PF) 2 %-1:200000 IJ SOLN
INTRAMUSCULAR | Status: AC
  Filled 2011-04-13: qty 20

## 2011-04-13 MED ORDER — FENTANYL CITRATE 0.05 MG/ML IJ SOLN
INTRAMUSCULAR | Status: AC
  Filled 2011-04-13: qty 2

## 2011-04-13 MED ORDER — IBUPROFEN 600 MG PO TABS
600.0000 mg | ORAL_TABLET | Freq: Four times a day (QID) | ORAL | Status: DC
  Administered 2011-04-14 – 2011-04-16 (×10): 600 mg via ORAL
  Filled 2011-04-13 (×6): qty 1

## 2011-04-13 MED ORDER — MEPERIDINE HCL 25 MG/ML IJ SOLN: 6.2500 mg | INTRAMUSCULAR | Status: DC | PRN

## 2011-04-13 MED ORDER — PRENATAL MULTIVITAMIN CH
1.0000 | ORAL_TABLET | Freq: Every day | ORAL | Status: DC
  Administered 2011-04-14 – 2011-04-16 (×3): 1 via ORAL
  Filled 2011-04-13 (×3): qty 1

## 2011-04-13 MED ORDER — MORPHINE SULFATE 0.5 MG/ML IJ SOLN
INTRAMUSCULAR | Status: AC
  Filled 2011-04-13: qty 10

## 2011-04-13 MED ORDER — DIPHENHYDRAMINE HCL 25 MG PO CAPS: 25.0000 mg | ORAL_CAPSULE | Freq: Four times a day (QID) | ORAL | Status: DC | PRN

## 2011-04-13 MED ORDER — IBUPROFEN 600 MG PO TABS
600.0000 mg | ORAL_TABLET | Freq: Four times a day (QID) | ORAL | Status: DC | PRN
  Filled 2011-04-13 (×5): qty 1

## 2011-04-13 MED ORDER — OXYCODONE-ACETAMINOPHEN 5-325 MG PO TABS: 1.0000 | ORAL_TABLET | ORAL | Status: DC | PRN

## 2011-04-13 MED ORDER — DIBUCAINE 1 % RE OINT: 1.0000 "application " | TOPICAL_OINTMENT | RECTAL | Status: DC | PRN

## 2011-04-13 MED ORDER — LANOLIN HYDROUS EX OINT: 1.0000 "application " | TOPICAL_OINTMENT | CUTANEOUS | Status: DC | PRN

## 2011-04-13 MED ORDER — DIPHENHYDRAMINE HCL 50 MG/ML IJ SOLN: 25.0000 mg | INTRAMUSCULAR | Status: DC | PRN

## 2011-04-13 MED ORDER — SODIUM CHLORIDE 0.9 % IJ SOLN: 3.0000 mL | INTRAMUSCULAR | Status: DC | PRN

## 2011-04-13 MED ORDER — OXYTOCIN 20 UNITS IN LACTATED RINGERS INFUSION - SIMPLE: 125.0000 mL/h | INTRAVENOUS | Status: AC

## 2011-04-13 MED ORDER — KETOROLAC TROMETHAMINE 30 MG/ML IJ SOLN
30.0000 mg | Freq: Four times a day (QID) | INTRAMUSCULAR | Status: AC | PRN
  Administered 2011-04-13 (×2): 30 mg via INTRAVENOUS

## 2011-04-13 MED ORDER — SCOPOLAMINE 1 MG/3DAYS TD PT72
1.0000 | MEDICATED_PATCH | Freq: Once | TRANSDERMAL | Status: AC
  Administered 2011-04-13: 1.5 mg via TRANSDERMAL

## 2011-04-13 MED ORDER — PROMETHAZINE HCL 25 MG/ML IJ SOLN: 6.2500 mg | INTRAMUSCULAR | Status: DC | PRN

## 2011-04-13 MED ORDER — NALBUPHINE HCL 10 MG/ML IJ SOLN
5.0000 mg | INTRAMUSCULAR | Status: DC | PRN
  Administered 2011-04-14: 10 mg via SUBCUTANEOUS
  Filled 2011-04-13 (×3): qty 1

## 2011-04-13 MED ORDER — ONDANSETRON HCL 4 MG/2ML IJ SOLN: 4.0000 mg | INTRAMUSCULAR | Status: DC | PRN

## 2011-04-13 MED ORDER — SCOPOLAMINE 1 MG/3DAYS TD PT72
MEDICATED_PATCH | TRANSDERMAL | Status: AC
  Administered 2011-04-13: 1.5 mg via TRANSDERMAL
  Filled 2011-04-13: qty 1

## 2011-04-13 MED ORDER — CITRIC ACID-SODIUM CITRATE 334-500 MG/5ML PO SOLN
30.0000 mL | Freq: Once | ORAL | Status: AC
  Administered 2011-04-13: 30 mL via ORAL

## 2011-04-13 MED ORDER — LACTATED RINGERS IV SOLN
INTRAVENOUS | Status: DC | PRN
  Administered 2011-04-13 (×3): via INTRAVENOUS

## 2011-04-13 MED ORDER — ONDANSETRON HCL 4 MG/2ML IJ SOLN
INTRAMUSCULAR | Status: DC | PRN
  Administered 2011-04-13: 4 mg via INTRAVENOUS

## 2011-04-13 MED ORDER — DIPHENHYDRAMINE HCL 25 MG PO CAPS: 25.0000 mg | ORAL_CAPSULE | ORAL | Status: DC | PRN

## 2011-04-13 MED ORDER — SODIUM CHLORIDE 0.9 % IV SOLN
1.0000 ug/kg/h | INTRAVENOUS | Status: DC | PRN
  Filled 2011-04-13: qty 2.5

## 2011-04-13 MED ORDER — HYDROCODONE-ACETAMINOPHEN 5-325 MG PO TABS
1.0000 | ORAL_TABLET | ORAL | Status: DC | PRN
  Administered 2011-04-14 (×4): 1 via ORAL
  Administered 2011-04-15 – 2011-04-16 (×4): 2 via ORAL
  Filled 2011-04-13: qty 1
  Filled 2011-04-13: qty 2
  Filled 2011-04-13: qty 1
  Filled 2011-04-13 (×3): qty 2
  Filled 2011-04-13 (×2): qty 1

## 2011-04-13 MED ORDER — KETOROLAC TROMETHAMINE 30 MG/ML IJ SOLN: 15.0000 mg | Freq: Once | INTRAMUSCULAR | Status: DC | PRN

## 2011-04-13 MED ORDER — SODIUM CHLORIDE 0.9 % IV SOLN
3.0000 g | Freq: Four times a day (QID) | INTRAVENOUS | Status: DC
  Administered 2011-04-13: 3 g via INTRAVENOUS
  Filled 2011-04-13 (×4): qty 3

## 2011-04-13 MED ORDER — ONDANSETRON HCL 4 MG/2ML IJ SOLN: 4.0000 mg | Freq: Three times a day (TID) | INTRAMUSCULAR | Status: DC | PRN

## 2011-04-13 MED ORDER — CHLOROPROCAINE HCL 3 % IJ SOLN
INTRAMUSCULAR | Status: AC
  Filled 2011-04-13: qty 20

## 2011-04-13 MED ORDER — ZOLPIDEM TARTRATE 5 MG PO TABS: 5.0000 mg | ORAL_TABLET | Freq: Every evening | ORAL | Status: DC | PRN

## 2011-04-13 MED ORDER — SODIUM BICARBONATE 8.4 % IV SOLN
INTRAVENOUS | Status: AC
  Filled 2011-04-13: qty 50

## 2011-04-13 MED ORDER — DIPHENHYDRAMINE HCL 50 MG/ML IJ SOLN: 12.5000 mg | INTRAMUSCULAR | Status: DC | PRN

## 2011-04-13 MED ORDER — SIMETHICONE 80 MG PO CHEW: 80.0000 mg | CHEWABLE_TABLET | ORAL | Status: DC | PRN

## 2011-04-13 SURGICAL SUPPLY — 38 items
APL SKNCLS STERI-STRIP NONHPOA (GAUZE/BANDAGES/DRESSINGS) ×1
BENZOIN TINCTURE PRP APPL 2/3 (GAUZE/BANDAGES/DRESSINGS) ×2 IMPLANT
CHLORAPREP W/TINT 26ML (MISCELLANEOUS) ×2 IMPLANT
CLOTH BEACON ORANGE TIMEOUT ST (SAFETY) ×2 IMPLANT
CONTAINER PREFILL 10% NBF 15ML (MISCELLANEOUS) IMPLANT
DRESSING TELFA 8X3 (GAUZE/BANDAGES/DRESSINGS) ×1 IMPLANT
DRSG COVADERM 4X10 (GAUZE/BANDAGES/DRESSINGS) ×1 IMPLANT
ELECT REM PT RETURN 9FT ADLT (ELECTROSURGICAL) ×2
ELECTRODE REM PT RTRN 9FT ADLT (ELECTROSURGICAL) ×1 IMPLANT
EXTRACTOR VACUUM M CUP 4 TUBE (SUCTIONS) IMPLANT
GLOVE BIO SURGEON STRL SZ7.5 (GLOVE) ×4 IMPLANT
GLOVE BIOGEL PI IND STRL 7.5 (GLOVE) ×1 IMPLANT
GLOVE BIOGEL PI INDICATOR 7.5 (GLOVE) ×1
GOWN PREVENTION PLUS LG XLONG (DISPOSABLE) ×6 IMPLANT
HEMOSTAT SURGICEL 2X14 (HEMOSTASIS) ×1 IMPLANT
KIT ABG SYR 3ML LUER SLIP (SYRINGE) IMPLANT
NDL HYPO 25X5/8 SAFETYGLIDE (NEEDLE) IMPLANT
NEEDLE HYPO 22GX1.5 SAFETY (NEEDLE) IMPLANT
NEEDLE HYPO 25X5/8 SAFETYGLIDE (NEEDLE) IMPLANT
NS IRRIG 1000ML POUR BTL (IV SOLUTION) ×2 IMPLANT
PACK C SECTION WH (CUSTOM PROCEDURE TRAY) ×2 IMPLANT
PAD ABD 7.5X8 STRL (GAUZE/BANDAGES/DRESSINGS) ×1 IMPLANT
RETRACTOR WND ALEXIS 25 LRG (MISCELLANEOUS) ×1 IMPLANT
RTRCTR WOUND ALEXIS 25CM LRG (MISCELLANEOUS)
SLEEVE SCD COMPRESS KNEE MED (MISCELLANEOUS) ×1 IMPLANT
STRIP CLOSURE SKIN 1/2X4 (GAUZE/BANDAGES/DRESSINGS) ×2 IMPLANT
SUT CHROMIC 2 0 CT 1 (SUTURE) ×2 IMPLANT
SUT MNCRL AB 3-0 PS2 27 (SUTURE) ×2 IMPLANT
SUT PLAIN 0 NONE (SUTURE) IMPLANT
SUT PLAIN 2 0 XLH (SUTURE) ×2 IMPLANT
SUT VIC AB 0 CT1 36 (SUTURE) ×2 IMPLANT
SUT VIC AB 0 CTX 36 (SUTURE) ×10
SUT VIC AB 0 CTX36XBRD ANBCTRL (SUTURE) ×3 IMPLANT
SYR CONTROL 10ML LL (SYRINGE) IMPLANT
TAPE CLOTH SURG 4X10 WHT LF (GAUZE/BANDAGES/DRESSINGS) ×1 IMPLANT
TOWEL OR 17X24 6PK STRL BLUE (TOWEL DISPOSABLE) ×4 IMPLANT
TRAY FOLEY CATH 14FR (SET/KITS/TRAYS/PACK) ×1 IMPLANT
WATER STERILE IRR 1000ML POUR (IV SOLUTION) ×2 IMPLANT

## 2011-04-13 NOTE — Transfer of Care (Signed)
Immediate Anesthesia Transfer of Care Note  Patient: Marcia Bowers  Procedure(s) Performed: Procedure(s) (LRB): CESAREAN SECTION (N/A)  Patient Location: PACU  Anesthesia Type: Epidural  Level of Consciousness: awake, alert  and oriented  Airway & Oxygen Therapy: Patient Spontanous Breathing and Patient connected to nasal cannula oxygen  Post-op Assessment: Report given to PACU RN and Post -op Vital signs reviewed and stable  Post vital signs: Reviewed and stable  Complications: No apparent anesthesia complications

## 2011-04-13 NOTE — Progress Notes (Signed)
Comfortable some pressure O VSS      Fhts category 1      abd soft, nt      uc adequate x 2 hours      Vag 8 100 0/+1 ruptured A active labor     Adequate uc x 2 hours P continue care, plan to labor down. Lavera Guise, CNM

## 2011-04-13 NOTE — Consult Note (Signed)
Neonatology Note:   Attendance at C-section:    I was asked to attend this primary C/S at term due to failure of descent. The mother is a G1P0 O pos, GBS neg who was completely dilated for 6 hours prior to delivery, but there were late FHR decelerations with pushing. Mother had a fever to 101.8 degrees and received Tylenol and one dose of Unasyn about 1 hour before delivery. ROM 27 hours prior to delivery, fluid meconium-stained. Infant vigorous with good spontaneous cry and tone. Needed  bulb suctioning for thick globs of meconium in nares and throat. Ap 8/9. Lungs clear to ausc in DR. To CN to care of Pediatrician.   Deatra James, MD

## 2011-04-13 NOTE — Progress Notes (Signed)
Marcia Bowers is a 31 y.o. G1P0 at [redacted]w[redacted]d  Subjective: Pt comfortable at present.  Denies feeling any vag/rectal pressure.  Talking on phone at intervals.  Objective: BP 133/70  Pulse 119  Temp(Src) 101.4 F (38.6 C) (Oral)  Resp 20  Ht 5\' 3"  (1.6 m)  Wt 68.493 kg (151 lb)  BMI 26.75 kg/m2  SpO2 100% I/O last 3 completed shifts: In: 650 [Other:650] Out: -    FHT:  FHR: 175 bpm, variability: minimal ,  accelerations:  Abscent,  decelerations:  Present Occasional late decel.  Variable decel to 90bpm with pushing.   UC:   regular, every 2-3 minutes, strong to palpation.  MVUs 140-170 with pitocin at  SVE:   Dilation: 10 Effacement (%): 100 Station: +2 Exam by:: Marcia Bowers Caput and molding noted as well as prominent spines.   Labs: Lab Results  Component Value Date   WBC 21.6* 04/12/2011   HGB 12.2 04/12/2011   HCT 37.9 04/12/2011   MCV 94.5 04/12/2011   PLT 223 04/12/2011    Assessment / Plan: Developing chorioamnionitis Arrest of descent   Labor: Arrest of descent Preeclampsia:  no signs or symptoms of toxicity Fetal Wellbeing:  Category II Pain Control:  Epidural I/D:  n/a Anticipated MOD:  Call to Dr. Su Hilt regarding fetal station as well as FHR tracing.    Per Dr. Su Hilt, [facility] C/S d/w pt including risk of injury to adjacent organs, infection and increased blood loss.  Pt agrees to proceed. Will begin Unasyn. Begin prep for OR.    Marcia Bowers O. 04/13/2011, 9:25 AM

## 2011-04-13 NOTE — OR Nursing (Signed)
Surgicel size 2 inch by 14 inch placed in abdominal surgical wound by Dr. Lance Morin.

## 2011-04-13 NOTE — Op Note (Addendum)
Cesarean Section Procedure Note  Indications: 62 3/7wks with FTD and Chorioamnionitis  Pre-operative Diagnosis: failure to descend/chorioamnionitis   Post-operative Diagnosis: failure to descend /chorioamnionitis  Procedure: CESAREAN SECTION  Surgeon: Osborn Coho, MD    Assistants: Elsie Ra, CNM  Anesthesia: Regional  Anesthesiologist: Velna Hatchet, MD   Procedure Details  The patient was taken to the operating room secondary to FTD and Chorio after the risks, benefits, complications, treatment options, and expected outcomes were discussed with the patient.  The patient concurred with the proposed plan, giving informed consent which was signed and witnessed. The patient was taken to Operating Room 1, identified as Marcia Bowers and the procedure verified as C-Section Delivery. A Time Out was held and the above information confirmed.  After induction of anesthesia by obtaining a surgical level via the epidural, the patient was prepped and draped in the usual sterile manner. A Pfannenstiel skin incision was made and carried down through the subcutaneous tissue to the underlying layer of fascia.  The fascia was incised bilaterally and extended transversely bilaterally with the Mayo scissors. Kocher clamps were placed on the inferior aspect of the fascial incision and the underlying rectus muscle was separated from the fascia. The same was done on the superior aspect of the fascial incision.  The peritoneum was identified, entered bluntly and extended manually. The utero-vesical peritoneal reflection was incised transversely and the bladder flap was bluntly freed from the lower uterine segment. A low transverse uterine incision was made with the scalpel and extended bilaterally with the bandage scissors.  The fluid was meconium stained and foul smelling.  The infant was delivered in vertex presentation with ROP position with extreme effort secondary to held wedged in pelvis.  A hand  was needed below to help elevate the head.  After the umbilical cord was clamped and cut, the infant was handed to the awaiting pediatricians.  Cord blood was obtained for evaluation.  The placenta was removed intact and appeared to be within normal limits. The uterus was cleared of Bowers clots and debris. There were two uterine extensions extending approx 2cm each which were repaired with 0 vicryl via running interlocking stitch and second imbricating layer was performed.  The primary uterine incision was closed with a running interlocking suture of 0 Vicryl and a second imbricating layer was performed as well.   Bilateral tubes and ovaries appeared to be within normal limits.  Good hemostasis was noted.  Copious irrigation was performed until clear.  The peritoneum was repaired with 2-0 chromic via a running suture.  The fascia was reapproximated with a running suture of 0 Vicryl. The subcutaneous tissue was reapproximated with 4 interrupted sutures of 2-0 plain.  The skin was reapproximated with a subcuticular suture of 3-0 monocryl.  Steristrips were applied with benzoin.  Instrument, sponge, and needle counts were correct prior to abdominal closure and at the conclusion of the case.  The patient was awaiting transfer to the recovery room in good condition.  Findings: Live female infant with Apgars 8 at one minute and 9 at five minutes.  Normal appearing bilateral ovaries and fallopian tubes were noted.  Estimated Blood Loss:  700 ml         Drains: foley to gravity with 200 ml of blood tinged urine         Total IV Fluids: 2000 ml         Specimens to Pathology: Placenta         Complications:  None; patient tolerated the procedure well.         Disposition: PACU - hemodynamically stable.         Condition: stable  Attending Attestation: I performed the procedure.

## 2011-04-13 NOTE — Progress Notes (Signed)
Patient ID: JUDIANN CELIA, female   DOB: 1980/06/03, 31 y.o.   MRN: [id number] Comfortable with epidural O VSS     fhts 150s LTV min     abd soft, gravid     uc 2-4 adequate     Vag C +1 A 2nd stage P labor down, Dr. Su Hilt here and updated. Lavera Guise, CNM

## 2011-04-13 NOTE — Progress Notes (Addendum)
Patient ID: Marcia Bowers, female   DOB: 12/11/1980, 31 y.o.   MRN: [id number] Feeling pressure O Fhts 150s LTV mod     uc q 1-5      Vag C +1 per Dr. Su Hilt A 2nd stage 3 hours P Dr. Su Hilt discussed resuming IV pitocin and labor down with adequate uc 1-2 hours or cesarean section, pt requests pitocin option Lavera Guise, CNM  Agree with above.  Pt's ctxs were inadequate and there had been no cervical change with pitocin d/c'd.  Fetal status is overall reassuring with pitocin off.  I discussed options with the patient and she wants to try restarting pitocin.  Will restart at 1mu and increase by 1mu until adequate.  Fetal station is too high for vacuum extraction and there is significant caput.  10am Called by Elsie Ra, CNM with report of pt temp now 101 and undergoing bolus and tylenol.  I asked her to start unasyn as well.  She reported fetal status is overall reassuring when not pushing and there is even more caput but no significant descent of the skull and she reports prominent ischial spines.  The patient is now ready to proceed with csection secondary to chorio and continue FTP in spite of adequate ctxs now for several hrs.  OR notified.

## 2011-04-13 NOTE — OR Nursing (Signed)
Uterus massaged by S. [place] [employer] fundraiser. Two tubes of cord blood to lab. Foley catheter in upon arrival to OR. Urine color-blood tinged.

## 2011-04-13 NOTE — Progress Notes (Addendum)
Comfortable with epidural agrees to scalp electrode O Fhts 140s LTV min early decels    abd soft, between uc    UC q 2-4     Vag C +1 A 2nd stage P pushing now. Lavera Guise, CNM Addendum: scalp electrode placed easily with pt consent, IV pitocin resumes. MK

## 2011-04-13 NOTE — Anesthesia Postprocedure Evaluation (Signed)
  Anesthesia Post-op Note  Patient: Marcia Bowers  Procedure(s) Performed: Procedure(s) (LRB): CESAREAN SECTION (N/A)  Patient Location: PACU and Mother/Baby  Anesthesia Type: Epidural  Level of Consciousness: awake, alert  and oriented  Airway and Oxygen Therapy: Patient Spontanous Breathing   Post-op Assessment: Patient's Cardiovascular Status Stable and Respiratory Function Stable  Post-op Vital Signs: stable  Complications: No apparent anesthesia complications

## 2011-04-13 NOTE — Anesthesia Postprocedure Evaluation (Signed)
Anesthesia Post Note  Patient: Marcia Bowers  Procedure(s) Performed: Procedure(s) (LRB): CESAREAN SECTION (N/A)  Anesthesia type: Spinal  Patient location: PACU  Post pain: Pain level controlled  Post assessment: Post-op Vital signs reviewed  Last Vitals:  Filed Vitals:   04/13/11 1206  BP: 107/60  Pulse: 97  Temp: 36.8 C  Resp: 16    Post vital signs: Reviewed  Level of consciousness: awake  Complications: No apparent anesthesia complications

## 2011-04-14 DIAGNOSIS — O479 False labor, unspecified: Secondary | ICD-10-CM | POA: Diagnosis not present

## 2011-04-14 LAB — CBC
MCH: 30.2 pg (ref 26.0–34.0)
Platelets: 158 10*3/uL (ref 150–400)
RBC: 2.78 MIL/uL — ABNORMAL LOW (ref 3.87–5.11)

## 2011-04-14 MED ORDER — POLYSACCHARIDE IRON COMPLEX 150 MG PO CAPS
150.0000 mg | ORAL_CAPSULE | Freq: Two times a day (BID) | ORAL | Status: DC
  Administered 2011-04-15 – 2011-04-16 (×3): 150 mg via ORAL
  Filled 2011-04-14 (×7): qty 1

## 2011-04-14 MED ORDER — LACTATED RINGERS IV SOLN: Freq: Once | INTRAVENOUS | Status: DC

## 2011-04-14 NOTE — Progress Notes (Signed)
Patient ID: Marcia Bowers, female   DOB: [birth year], 31 y.o.   MRN: [id number] Subjective: Postpartum Day 1: Cesarean Delivery Patient reports tolerating PO, + flatus and no problems voiding.  Itching  no complaints, up ad lib without syncope, infant in NICU - BF this am, also pumping Pain well controlled with po meds, using vicodin instead of percocet per pt request Mood stable, bonding well   Objective: Vital signs in last 24 hours: Temp:  [98 F (36.7 C)-99.6 F (37.6 C)] 99 F (37.2 C) (03/24 1000) Pulse Rate:  [83-116] 98  (03/24 1000) Resp:  [12-20] 18  (03/24 1000) BP: (83-113)/(48-72) 91/58 mmHg (03/24 1000) SpO2:  [95 %-100 %] 98 % (03/24 1000)  Physical Exam:  General: alert and no distress Breasts: soft Heart: RRR Lungs: CTAB Abdomen: BS x4 Uterine Fundus: firm Incision: dressing CDI Lochia: appropriate DVT Evaluation: No evidence of DVT seen on physical exam. Negative Homan's sign. No significant calf/ankle edema.   Basename 04/14/11 0515 04/12/11 0840  HGB 8.4* 12.2  HCT 26.1* 37.9    Assessment/Plan: Status post Cesarean section. Doing well postoperatively.  Continue current care Anemia - asymptomatic - will check orthostatics .  Marcia Bowers M 04/14/2011, 11:52 AM

## 2011-04-14 NOTE — Progress Notes (Addendum)
Pt. has had just one void (amber, clear) since cath removal (375cc) at 0800 this morning. Encouraged to drink water and juice, but did not have much to drink in AM since Pt. in NICU with baby even after encouragement by RN. Pt. refused cords to be hooked up such as IV fluids, continuous O2 sensor since last evening. Pt. encouraged and educated to have bladder scan and the possibility of an I&O catheter if urine found due to anesthesia effects on bladder function but Pt. refused after RN explanation and said she would just drink more fluids from this point forward. Will continue to monitor as needed.  Pt also has not eaten since pre delivery and says she is not hungry. Pt encouraged at multiple points to eat especially with pain medications and healing.

## 2011-04-14 NOTE — Progress Notes (Addendum)
Pt up to void 100cc. Pt states that it is hard for her to void, that she has to "push" to void. Urine is a dark yellow color. Bladder scanner used and shows 317-436cc after voiding. Pt denies feeling uncomfortable, in and out cath refused. Pt is encouraged to drink 1 pincher of water every 2 hours to promote hydration. Will continue to monitor.

## 2011-04-14 NOTE — Progress Notes (Addendum)
Patient uncooperative in regards to plan of care. O2 saturation monitor and SCD's removed prior to patient going to NICU per patient request. IV saline locked.

## 2011-04-14 NOTE — Progress Notes (Signed)
Patient continues to have low blood pressures and continues to fall asleep while talking with staff members. Lactated Ringers bolus 500 ml ordered per Elsie Ra CNM, and is infusing at this time. Elsie Ra to evaluate patient in regards to patient going to NICU to feed infant.

## 2011-04-14 NOTE — Progress Notes (Signed)
PSYCHOSOCIAL ASSESSMENT ~ MATERNAL/CHILD Name:  Marcia Bowers Age:  32 day Referral Date: 04/14/11 Reason/Source: NICU admission  I. FAMILY/HOME ENVIRONMENT A. Child's Legal Guardian Name: Marcia Bowers  DOB:   1980/06/24                                               Age: 35                   Address:  [address] [place] [address]   Name:  Marcia Bowers DOB:                                                  Age:                   Address:[address]                                   [place] [id number]    B. Other Household Members/Support Persons Name:  FOB has 4 other children that occasionally stay  Relationship:                    DOB:        Name:                    Relationship:               DOB:        Name:                         Relationship:               DOB:                   Name:                   Relationship:               DOB: C.   Other Support:   II. PSYCHOSOCIAL DATA A. Information Source:  MOB and FOB                    B. Event organiser         Employment:    Medicaid:  Yes    County: [employer]:                            Self Pay:   Sales executive:        WIC:   Yes    [employer]:       Paramedic Housing:       Section 8:    Maternity Care Coordination/Child Service Coordination/Early Intervention   School:  Grade:  Other:  C. Cultural and Environment Information Cultural Issues Impacting Care N/A  III. STRENGTHS             Supportive family/friends: Yes             Adequate Resources: Yes             Compliance with medical plan: Yes             Home prepared for Child (including basic supplies): Yes             Understanding of Illness: Yes             Other:   IV. RISK FACTORS AND CURRENT PROBLEMS       No Problems Noted               Substance abuse:                                     Pt:            Family:             Family/Relationship Issues:                     Pt:            Family:             Financial Resources:                               Pt:            Family:             DSS Involvement:                                    Pt:             Family:             Knowledge/Cognitive Deficit:                   Pt:             Family:                Basic Needs(food, housing, etc.)             Pt:             Family:             Mental Illness:                                           Pt:             Family:             Abuse/Neglect/Domestic Violence           Pt:             Family:             Transportation:  Pt:              Family:             Adjustment to Illness:                               Pt:              Family:             Compliance with Treatment:                    Pt:              Family:             Housing Concerns                                   Pt:              Family:             Other:               V. SOCIAL WORK ASSESSMENT  CSW spoke with MOB and FOB in NICU.  Provided NICU brochure to help with understanding of CSW role in NICU.  MOB expressed being emotionally stable at this time and will let RN or CSW know if any concerns arise.  No hx of SA, or current concern.  MOB and FOB do not express any concerns at this time as far as supplies or family support.  MOB was awaiting more information about infants treatment when CSW spoke with her.  Will continue to follow while infant in NICU and provide support.  VI. SOCIAL WORK PLAN (in bold)             No Further Intervention Required/ No Barriers to Discharge             Psychosocial Support and Ongoing Assessment if Needs             Patient/Family Education             Child Protective Services Report                      [place]:                       Date:             Information/Referral to [facility]              Other

## 2011-04-15 DIAGNOSIS — O2690 Pregnancy related conditions, unspecified, unspecified trimester: Secondary | ICD-10-CM | POA: Diagnosis present

## 2011-04-15 NOTE — Progress Notes (Signed)
UR chart review completed.  

## 2011-04-15 NOTE — Progress Notes (Signed)
To nicu

## 2011-04-15 NOTE — Progress Notes (Addendum)
Subjective: Postpartum Day 2 Cesarean Delivery Patient reports tolerating PO, + flatus and no problems voiding, mediation for pain is working well.    Objective: Vital signs in last 24 hours: Temp:  [98 F (36.7 C)-98.3 F (36.8 C)] 98 F (36.7 C) (03/25 0545) Pulse Rate:  [73-86] 86  (03/25 0545) Resp:  [18] 18  (03/25 0545) BP: (91-100)/(58-64) 91/58 mmHg (03/25 0545) Hemoglobin & Hematocrit     Component Value Date/Time   HGB 8.4* 04/14/2011 0515   HCT 26.1* 04/14/2011 0515    Physical Exam:  General: alert, cooperative and no distress Lochia: appropriate Uterine Fundus: firm Incision: healing well DVT Evaluation: Negative Homan's sign., +1 edema lower legs Lungs clear bilaterally, AP RRR, bowel sounds hypoactive, abd distended   Basename 04/14/11 0515  HGB 8.4*  HCT 26.1*    Assessment/Plan: Status post Cesarean section Anemia  Doing well postoperatively.  Continue current care plans depo or nexplanon.  KREBSBACH, MARY 04/15/2011, 11:07 AM   will repeat cbc and orthostatic vitals in am prior to DC home

## 2011-04-16 ENCOUNTER — Encounter (HOSPITAL_COMMUNITY): Payer: Self-pay | Admitting: Obstetrics and Gynecology

## 2011-04-16 DIAGNOSIS — Z98891 History of uterine scar from previous surgery: Secondary | ICD-10-CM

## 2011-04-16 LAB — CBC
HCT: 23.7 % — ABNORMAL LOW (ref 36.0–46.0)
Platelets: 217 10*3/uL (ref 150–400)
RDW: 12.9 % (ref 11.5–15.5)
WBC: 21.2 10*3/uL — ABNORMAL HIGH (ref 4.0–10.5)

## 2011-04-16 LAB — DIFFERENTIAL
Basophils Absolute: 0 10*3/uL (ref 0.0–0.1)
Lymphocytes Relative: 6 % — ABNORMAL LOW (ref 12–46)
Monocytes Absolute: 1 10*3/uL (ref 0.1–1.0)
Neutro Abs: 18.8 10*3/uL — ABNORMAL HIGH (ref 1.7–7.7)
Neutrophils Relative %: 89 % — ABNORMAL HIGH (ref 43–77)

## 2011-04-16 MED ORDER — DOCUSATE SODIUM 100 MG PO CAPS: 100.0000 mg | ORAL_CAPSULE | Freq: Two times a day (BID) | ORAL | Status: AC

## 2011-04-16 MED ORDER — MEDROXYPROGESTERONE ACETATE 150 MG/ML IM SUSP
150.0000 mg | Freq: Once | INTRAMUSCULAR | Status: AC
  Administered 2011-04-16: 150 mg via INTRAMUSCULAR
  Filled 2011-04-16: qty 1

## 2011-04-16 MED ORDER — IBUPROFEN 600 MG PO TABS: 600.0000 mg | ORAL_TABLET | Freq: Four times a day (QID) | ORAL | Status: AC | PRN

## 2011-04-16 MED ORDER — HYDROCODONE-ACETAMINOPHEN 5-325 MG PO TABS: 1.0000 | ORAL_TABLET | ORAL | Status: AC | PRN

## 2011-04-16 MED ORDER — IBUPROFEN 600 MG PO TABS: 600.0000 mg | ORAL_TABLET | Freq: Four times a day (QID) | ORAL | Status: DC | PRN

## 2011-04-16 MED ORDER — FERROUS SULFATE 325 (65 FE) MG PO TABS: 325.0000 mg | ORAL_TABLET | Freq: Every day | ORAL | Status: AC

## 2011-04-16 NOTE — Discharge Instructions (Signed)
Cesarean Delivery  °Cesarean delivery is the birth of a baby through a cut (incision) in the abdomen and womb (uterus).  °LET YOUR CAREGIVER KNOW ABOUT: °· Complications involving the pregnancy.  °· Allergies.  °· Medicines taken including herbs, eyedrops, over-the-counter medicines, and creams.  °· Use of steroids (by mouth or creams).  °· Previous problems with anesthetics or numbing medicine.  °· Previous surgery.  °· History of blood clots.  °· History of bleeding or blood problems.  °· Other health problems.  °RISKS AND COMPLICATIONS  °· Bleeding.  °· Infection.  °· Blood clots.  °· Injury to surrounding organs.  °· Anesthesia problems.  °· Injury to the baby.  °BEFORE THE PROCEDURE  °· A tube (Foley catheter) will be placed in your bladder. The Foley catheter drains the urine from your bladder into a bag. This keeps your bladder empty during surgery.  °· An intravenous access tube (IV) will be placed in your arm.  °· Hair may be removed from your pubic area and your lower abdomen. This is to prevent infection in the incision site.  °· You may be given an antacid medicine to drink. This will prevent acid contents in your stomach from going into your lungs if you vomit during the surgery.  °· You may be given an antibiotic medicine to prevent infection.  °PROCEDURE  °· You may be given medicine to numb the lower half of your body (regional anesthetic). If you were in labor, you may have already had an epidural in place which can be used in both labor and cesarean delivery. You may possibly be given medicine to make you sleep (general anesthetic) though this is not as common.  °· An incision will be made in your abdomen that extends to your uterus. There are 2 basic kinds of incisions:  °· The horizontal (transverse) incision. Horizontal incisions are used for most routine cesarean deliveries.  °· The vertical (up and down) incision. This is less commonly used. This is most often reserved for women who have a  serious complication (extreme prematurity) or under emergency situations.   °· The horizontal and vertical incisions may both be used at the same time. However, this is very uncommon.  °· Your baby will then be delivered.  °AFTER THE PROCEDURE  °· If you were awake during the surgery, you will see your baby right away. If you were asleep, you will see your baby as soon as you are awake.  °· You may breastfeed your baby after surgery.  °· You may be able to get up and walk the same day as the surgery. If you need to stay in bed for a period of time, you will receive help to turn, cough, and take deep breaths after surgery. This helps prevent lung problems such as pneumonia.  °· Do not get out of bed alone the first time after surgery. You will need help getting out of bed until you are able to do this by yourself.  °· You may be able to shower the day after your cesarean delivery.  After the bandage (dressing) is taken off the incision site, a nurse will assist you to shower, if you like.   °· You will have pneumatic compressing hose placed on your feet or lower legs. These hose are used to prevent blood clots. When you are up and walking regularly, they will no longer be necessary.   °· Do not cross your legs when you sit.  °· Save any blood clots that you   pass. If you pass a clot while on the toilet, do not flush it. Call for the nurse. Tell the nurse if you think you are bleeding too much or passing too many clots.   Start drinking liquids and eating food as directed by your caregiver. If your stomach is not ready, drinking and eating too soon can cause an increase in bloating and swelling of your intestine and abdomen. This is very uncomfortable.   You will be given medicine as needed. Let your caregivers know if you are hurting. They want you to be comfortable. You may also be given an antibiotic to prevent an infection.   Your IV will be taken out when you are drinking a reasonable amount of fluids. The  Foley catheter is taken out when you are up and walking.   If your blood type is Rh negative and your baby's blood type is Rh positive, you will be given a shot of anti-D immune globulin. This shot prevents you from having Rh problems with a future pregnancy. You should get the shot even if you had your tubes tied (tubal ligation).   If you are allowed to take the baby for a walk, place the baby in the bassinet and push it. Do not carry your baby in your arms.  Document Released: 01/07/2005 Document Revised: 12/27/2010 Document Reviewed: 05/04/2010 [facility] Patient Information 2012 [place], [place]. Breastfeeding BENEFITS OF BREASTFEEDING For the baby  The first milk (colostrum) helps the baby's digestive system function better.   There are antibodies from the mother in the milk that help the baby fight off infections.   The baby has a lower incidence of asthma, allergies, and SIDS (sudden infant death syndrome).   The nutrients in breast milk are better than formulas for the baby and helps the baby's brain grow better.   Babies who breastfeed have less gas, colic, and constipation.  For the mother  Breastfeeding helps develop a very special bond between mother and baby.   It is more convenient, always available at the correct temperature and cheaper than formula feeding.   It burns calories in the mother and helps with losing weight that was gained during pregnancy.   It makes the uterus contract back down to normal size faster and slows bleeding following delivery.   Breastfeeding mothers have a lower risk of developing breast cancer.  NURSE FREQUENTLY  A healthy, full-term baby may breastfeed as often as every hour or space his or her feedings to every 3 hours.   How often to nurse will vary from baby to baby. Watch your baby for signs of hunger, not the clock.   Nurse as often as the baby requests, or when you feel the need to reduce the fullness of your breasts.   Awaken  the baby if it has been 3 to 4 hours since the last feeding.   Frequent feeding will help the mother make more milk and will prevent problems like sore nipples and engorgement of the breasts.  BABY'S POSITION AT THE BREAST  Whether lying down or sitting, be sure that the baby's tummy is facing your tummy.   Support the breast with 4 fingers underneath the breast and the thumb above. Make sure your fingers are well away from the nipple and baby's mouth.   Stroke the baby's lips and cheek closest to the breast gently with your finger or nipple.   When the baby's mouth is open wide enough, place all of your nipple and as  much of the dark area around the nipple as possible into your baby's mouth.   Pull the baby in close so the tip of the nose and the baby's cheeks touch the breast during the feeding.  FEEDINGS  The length of each feeding varies from baby to baby and from feeding to feeding.   The baby must suck about 2 to 3 minutes for your milk to get to him or her. This is called a "let down." For this reason, allow the baby to feed on each breast as long as he or she wants. Your baby will end the feeding when he or she has received the right balance of nutrients.   To break the suction, put your finger into the corner of the baby's mouth and slide it between his or her gums before removing your breast from his or her mouth. This will help prevent sore nipples.  REDUCING BREAST ENGORGEMENT  In the first week after your baby is born, you may experience signs of breast engorgement. When breasts are engorged, they feel heavy, warm, full, and may be tender to the touch. You can reduce engorgement if you:   Nurse frequently, every 2 to 3 hours. Mothers who breastfeed early and often have fewer problems with engorgement.   Place light ice packs on your breasts between feedings. This reduces swelling. Wrap the ice packs in a lightweight towel to protect your skin.   Apply moist hot packs to your  breast for 5 to 10 minutes before each feeding. This increases circulation and helps the milk flow.   Gently massage your breast before and during the feeding.   Make sure that the baby empties at least one breast at every feeding before switching sides.   Use a breast pump to empty the breasts if your baby is sleepy or not nursing well. You may also want to pump if you are returning to work or or you feel you are getting engorged.   Avoid bottle feeds, pacifiers or supplemental feedings of water or juice in place of breastfeeding.   Be sure the baby is latched on and positioned properly while breastfeeding.   Prevent fatigue, stress, and anemia.   Wear a supportive bra, avoiding underwire styles.   Eat a balanced diet with enough fluids.  If you follow these suggestions, your engorgement should improve in 24 to 48 hours. If you are still experiencing difficulty, call your lactation consultant or caregiver. IS MY BABY GETTING ENOUGH MILK? Sometimes, mothers worry about whether their babies are getting enough milk. You can be assured that your baby is getting enough milk if:  The baby is actively sucking and you hear swallowing.   The baby nurses at least 8 to 12 times in a 24 hour time period. Nurse your baby until he or she unlatches or falls asleep at the first breast (at least 10 to 20 minutes), then offer the second side.   The baby is wetting 5 to 6 disposable diapers (6 to 8 cloth diapers) in a 24 hour period by 47 to 57 days of age.   The baby is having at least 2 to 3 stools every 24 hours for the first few months. Breast milk is all the food your baby needs. It is not necessary for your baby to have water or formula. In fact, to help your breasts make more milk, it is best not to give your baby supplemental feedings during the early weeks.   The stool should  be soft and yellow.   The baby should gain 4 to 7 ounces per week after he is 14 days old.  TAKE CARE OF YOURSELF Take  care of your breasts by:  Bathing or showering daily.   Avoiding the use of soaps on your nipples.   Start feedings on your left breast at one feeding and on your right breast at the next feeding.   You will notice an increase in your milk supply 2 to 5 days after delivery. You may feel some discomfort from engorgement, which makes your breasts very firm and often tender. Engorgement "peaks" out within 24 to 48 hours. In the meantime, apply warm moist towels to your breasts for 5 to 10 minutes before feeding. Gentle massage and expression of some milk before feeding will soften your breasts, making it easier for your baby to latch on. Wear a well fitting nursing bra and air dry your nipples for 10 to 15 minutes after each feeding.   Only use cotton bra pads.   Only use pure lanolin on your nipples after nursing. You do not need to wash it off before nursing.  Take care of yourself by:   Eating well-balanced meals and nutritious snacks.   Drinking milk, fruit juice, and water to satisfy your thirst (about 8 glasses a day).   Getting plenty of rest.   Increasing calcium in your diet (1200 mg a day).   Avoiding foods that you notice affect the baby in a bad way.  SEEK MEDICAL CARE IF:   You have any questions or difficulty with breastfeeding.   You need help.   You have a hard, red, sore area on your breast, accompanied by a fever of 100.5 F (38.1 C) or more.   Your baby is too sleepy to eat well or is having trouble sleeping.   Your baby is wetting less than 6 diapers per day, by 95 days of age.   Your baby's skin or white part of his or her eyes is more yellow than it was in the hospital.   You feel depressed.  Document Released: 01/07/2005 Document Revised: 12/27/2010 Document Reviewed: 08/22/2008 [facility] Patient Information 2012 [place], [place].Anemia, Frequently Asked Questions  WHAT ARE THE SYMPTOMS OF ANEMIA?  Headache.   Difficulty thinking.   Fatigue.    Shortness of breath.   Weakness.   Rapid heartbeat.  AT WHAT POINT ARE PEOPLE CONSIDERED ANEMIC?  This varies with gender and age.   Both hemoglobin (Hgb) and hematocrit values are used to define anemia. These lab values are obtained from a complete blood count (CBC) test. This is performed at a caregiver's office.   The normal range of hemoglobin values for adult men is 14.0 g/dL to 14.7 g/dL. For nonpregnant women, values are 12.3 g/dL to 82.9 g/dL.   The World Health Organization defines anemia as less than 12 g/dL for nonpregnant women and less than 13 g/dL for men.   For adult males, the average normal hematocrit is 46%, and the range is 40% to 52%.   For adult females, the average normal hematocrit is 41%, and the range is 35% to 47%.   Values that fall below the lower limits can be a sign of anemia and should have further checking (evaluation).  GROUPS OF PEOPLE WHO ARE AT RISK FOR DEVELOPING ANEMIA INCLUDE:   Infants who are breastfed or taking a formula that is not fortified with iron.   Children going through a rapid growth spurt. The  iron available can not keep up with the needs for a red cell mass which must grow with the child.   Women in childbearing years. They need iron because of blood loss during menstruation.   Pregnant women. The growing fetus creates a high demand for iron.   People with ongoing gastrointestinal blood loss are at risk of developing iron deficiency.   Individuals with leukemia or cancer who must receive chemotherapy or radiation to treat their disease. The drugs or radiation used to treat these diseases often decreases the bone marrow's ability to make cells of all classes. This includes red blood cells, white blood cells, and platelets.   Individuals with chronic inflammatory conditions such as rheumatoid arthritis or chronic infections.   The elderly.  ARE SOME TYPES OF ANEMIA INHERITED?   Yes, some types of anemia are due to inherited  or genetic defects.   Sickle cell anemia. This occurs most often in people of African, African American, and Mediterranean descent.   Thalassemia (or Cooley's anemia). This type is found in people of Mediterranean and Southeast Asian descent. These types of anemia are common.   [place]. This is rare.  CAN CERTAIN MEDICATIONS CAUSE A PERSON TO BECOME ANEMIC?  Yes. For example, drugs to fight cancer (chemotherapeutic agents) often cause anemia. These drugs can slow the bone marrow's ability to make red blood cells. If there are not enough red blood cells, the body does not get enough oxygen. WHAT HEMATOCRIT LEVEL IS REQUIRED TO DONATE BLOOD?  The lower limit of an acceptable hematocrit for blood donors is 38%. If you have a low hematocrit value, you should schedule an appointment with your caregiver. ARE BLOOD TRANSFUSIONS COMMONLY USED TO CORRECT ANEMIA, AND ARE THEY DANGEROUS?  They are used to treat anemia as a last resort. Your caregiver will find the cause of the anemia and correct it if possible. Most blood transfusions are given because of excessive bleeding at the time of surgery, with trauma, or because of bone marrow suppression in patients with cancer or leukemia on chemotherapy. Blood transfusions are safer than ever before. We also know that blood transfusions affect the immune system and may increase certain risks. There is also a concern for human error. In 1/16,000 transfusions, a patient receives a transfusion of blood that is not matched with his or her blood type.  WHAT IS IRON DEFICIENCY ANEMIA AND CAN I CORRECT IT BY CHANGING MY DIET?  Iron is an essential part of hemoglobin. Without enough hemoglobin, anemia develops and the body does not get the right amount of oxygen. Iron deficiency anemia develops after the body has had a low level of iron for a long time. This is either caused by blood loss, not taking in or absorbing enough iron, or increased demands for iron (like pregnancy  or rapid growth).  Foods from animal origin such as beef, chicken, and pork, are good sources of iron. Be sure to have one of these foods at each meal. Vitamin C helps your body absorb iron. Foods rich in Vitamin C include citrus, bell pepper, strawberries, spinach and cantaloupe. In some cases, iron supplements may be needed in order to correct the iron deficiency. In the case of poor absorption, extra iron may have to be given directly into the vein through a needle (intravenously). I HAVE BEEN DIAGNOSED WITH IRON DEFICIENCY ANEMIA AND MY CAREGIVER PRESCRIBED IRON SUPPLEMENTS. HOW LONG WILL IT TAKE FOR MY BLOOD TO BECOME NORMAL?  It depends on the degree of  anemia at the beginning of treatment. Most people with mild to moderate iron deficiency, anemia will correct the anemia over a period of 2 to 3 months. But after the anemia is corrected, the iron stored by the body is still low. Caregivers often suggest an additional 6 months of oral iron therapy once the anemia has been reversed. This will help prevent the iron deficiency anemia from quickly happening again. Non-anemic adult males should take iron supplements only under the direction of a doctor, too much iron can cause liver damage.  MY HEMOGLOBIN IS 9 G/DL AND I AM SCHEDULED FOR SURGERY. SHOULD I POSTPONE THE SURGERY?  If you have Hgb of 9, you should discuss this with your caregiver right away. Many patients with similar hemoglobin levels have had surgery without problems. If minimal blood loss is expected for a minor procedure, no treatment may be necessary.  If a greater blood loss is expected for more extensive procedures, you should ask your caregiver about being treated with erythropoietin and iron. This is to accelerate the recovery of your hemoglobin to a normal level before surgery. An anemic patient who undergoes high-blood-loss surgery has a greater risk of surgical complications and need for a blood transfusion, which also carries some  risk.  I HAVE BEEN TOLD THAT HEAVY MENSTRUAL PERIODS CAUSE ANEMIA. IS THERE ANYTHING I CAN DO TO PREVENT THE ANEMIA?  Anemia that results from heavy periods is usually due to iron deficiency. You can try to meet the increased demands for iron caused by the heavy monthly blood loss by increasing the intake of iron-rich foods. Iron supplements may be required. Discuss your concerns with your caregiver. WHAT CAUSES ANEMIA DURING PREGNANCY?  Pregnancy places major demands on the body. The mother must meet the needs of both her body and her growing baby. The body needs enough iron and folate to make the right amount of red blood cells. To prevent anemia while pregnant, the mother should stay in close contact with her caregiver.  Be sure to eat a diet that has foods rich in iron and folate like liver and dark green leafy vegetables. Folate plays an important role in the normal development of a baby's spinal cord. Folate can help prevent serious disorders like spina bifida. If your diet does not provide adequate nutrients, you may want to talk with your caregiver about nutritional supplements.  WHAT IS THE RELATIONSHIP BETWEEN FIBROID TUMORS AND ANEMIA IN WOMEN?  The relationship is usually caused by the increased menstrual blood loss caused by fibroids. Good iron intake may be required to prevent iron deficiency anemia from developing.  Document Released: 08/16/2003 Document Revised: 12/27/2010 Document Reviewed: 01/30/2010 [facility] Patient Information 2012 [place], [place].

## 2011-04-16 NOTE — Discharge Summary (Signed)
Physician Discharge Summary  Patient ID: Marcia Bowers MRN: [id number] DOB/AGE: 1980/07/10 31 y.o.  Admit date: 04/12/2011 Discharge date: 04/16/2011  Admission Diagnoses term pg SROM Discharge Diagnoses:  Active Problems:  Pregnancy with complication  S/P cesarean section anemia  Discharged Condition: stable  Hospital Course:  term pg srom, IV pitocin augmentation, epidural, C/S for failure of descent   Consults: None  Significant Diagnostic Studies: labs:  Hemoglobin & Hematocrit     Component Value Date/Time   HGB 7.8* 04/16/2011 0515   HCT 23.7* 04/16/2011 0515     Treatments: surgery:  Discharge Exam: Blood pressure 104/76, pulse 109, temperature 98.1 F (36.7 C), temperature source Oral, resp. rate 18, height 5\' 3"  (1.6 m), weight 68.493 kg (151 lb), SpO2 98.00%, unknown if currently breastfeeding. General appearance: alert, cooperative and no distress Head: Normocephalic, without obvious abnormality, atraumatic Resp: clear to auscultation bilaterally Cardio: regular rate and rhythm, S1, S2 normal, no murmur, click, rub or gallop and regular rate and rhythm GI: soft, non-tender; bowel sounds normal; no masses,  no organomegaly Extremities: Homans sign is negative, no sign of DVT and +1 edema to lower legs Incision/Wound: well approximated no redness, edema, or drainage with steri strips  Disposition: 01-Home or Self Care  Discharge Orders    Future Appointments: Provider: Department: Dept Phone: Center:   05/23/2011 11:00 AM Esmeralda Arthur, MD [facility] [phone] None     Future Orders Please Complete By Expires   Discharge instructions      Comments:   [facility] booklet   Strep B DNA probe      Comments:   This external order was created through the Results Console.   HIV antibody      Comments:   This external order was created through the Results Console.   GC/chlamydia probe amp, genital      Comments:   This external order was created through the  Results Console.   Rubella antibody, IgM      Comments:   This external order was created through the Results Console.   Hepatitis B surface antigen      Comments:   This external order was created through the Results Console.   RPR      Comments:   This external order was created through the Results Console.   Antibody screen      Comments:   This external order was created through the Results Console.   ABO/Rh      Comments:   This external order was created through the Results Console.     Medication List  As of 04/16/2011 11:06 AM   TAKE these medications         cholecalciferol 1000 UNITS tablet   Commonly known as: VITAMIN D   Take 1,000 Units by mouth every morning.      docusate sodium 100 MG capsule   Commonly known as: COLACE   Take 100 mg by mouth 2 (two) times daily as needed. For constipation      docusate sodium 100 MG capsule   Commonly known as: COLACE   Take 1 capsule (100 mg total) by mouth 2 (two) times daily.      ferrous sulfate 325 (65 FE) MG tablet   Take 325 mg by mouth daily with breakfast.      ferrous sulfate 325 (65 FE) MG tablet   Take 1 tablet (325 mg total) by mouth daily with breakfast.      HYDROcodone-acetaminophen 5-325 MG per  tablet   Commonly known as: NORCO   Take 1-2 tablets by mouth every 4 (four) hours as needed.      ibuprofen 600 MG tablet   Commonly known as: ADVIL,MOTRIN   Take 1 tablet (600 mg total) by mouth every 6 (six) hours as needed for pain.      prenatal multivitamin Tabs   Take 1 tablet by mouth every morning.           Follow-up Information    Follow up with [facility] in 6 weeks.       birth control options reviewed undecided between depo and nexplanon.  SignedLavera Guise 04/16/2011, 11:06 AM

## 2011-04-16 NOTE — Consult Note (Signed)
Mom being discharged to home today.  Baby is going home soon. Mom is pumping every 3 hours, and just beginning to get milk. She made  an out patient  Lactation appointment for 3 days form now, on Friday, 3/29, for a feeding assessment. Mom will supplement with formula only if she feels it is necessary.

## 2011-04-16 NOTE — Progress Notes (Signed)
Subjective: Postpartum Day 3: Cesarean Delivery Patient reports nausea, incisional pain, tolerating PO and no problems voiding.    Objective: Vital signs in last 24 hours: Temp:  [98.1 F (36.7 C)-98.5 F (36.9 C)] 98.1 F (36.7 C) (03/26 0647) Pulse Rate:  [80-109] 109  (03/26 1059) Resp:  [18-20] 18  (03/26 0647) BP: (96-116)/(64-76) 104/76 mmHg (03/26 1059)  Physical Exam:  General: alert and cooperative Lochia: appropriate Uterine Fundus: firm Incision: healing well DVT Evaluation: No evidence of DVT seen on physical exam.   Basename 04/16/11 0515 04/14/11 0515  HGB 7.8* 8.4*  HCT 23.7* 26.1*    Assessment/Plan: Status post Cesarean section. Doing well postoperatively.  Discharge home with standard precautions and return to clinic in 4-6 weeks. Ambulating without difficulty.Long history of anemia. Transfusion discussed. R and B reviewed. Declined for now. Pumping breast and bottle feeding for now. DepoProvera for contraception. Nexplanon in 10 weeks.  Ayianna Darnold V 04/16/2011, 11:00 AM

## 2011-04-17 ENCOUNTER — Encounter (INDEPENDENT_AMBULATORY_CARE_PROVIDER_SITE_OTHER): Payer: Medicaid Other | Admitting: Registered Nurse

## 2011-04-17 DIAGNOSIS — Z3480 Encounter for supervision of other normal pregnancy, unspecified trimester: Secondary | ICD-10-CM

## 2011-04-19 ENCOUNTER — Ambulatory Visit (HOSPITAL_COMMUNITY)
Admit: 2011-04-19 | Discharge: 2011-04-19 | Disposition: A | Discharge status: Home / Self Care | Payer: Medicaid Other | Attending: Nurse Practitioner | Admitting: Nurse Practitioner

## 2011-04-19 NOTE — Progress Notes (Signed)
Adult Lactation Consultation Outpatient Visit Note  Patient Name: Marcia Bowers  Burroughs Date of Birth: [birth year]                                     3/23/ 13 Gestational Age at Delivery: Unknown                41 2/7 weeks Type of Delivery:  c-section  Breastfeeding History: Frequency of Breastfeeding: on demand every 4 hours, followed with formula ---30 - 60 mls Length of Feeding: 30 minutes Voids: 6-8 Stools: 3-4  Supplementing / Method: Pumping:  Type of Pump:single electric pump   Frequency:mom just pumped yesterday  since her milk just came in  Volume:  10 mls - om's breast much fuller when she woke  up this morning  Comments:  Mom received a Depo shot proir to her hospital discharge , and  is presently on 12.5 mg HCTZ daily    Consultation Evaluation:  Initial Feeding Assessment: Pre-feed Weight:3016 Post-feed WUJWJX:9147 Amount Transferred:50 Comments:breast fed 20 mins first breast  15 mins on second breast  Additional Feeding Assessment: Pre-feed WGNFAO:1308 Post-feed MVHQIO:9629 Amount Transferred:8 Comments:fed 15 mins  Additional Feeding Assessment: Pre-feed Weight: Post-feed Weight: Amount Transferred: Comments:  Total Breast milk Transferred this Visit: 58 Total Supplement Given: none  Additional Interventions:mom plans to fully breast as of today. She will call as needed   Follow-Up as needed      Alfred Levins 04/19/2011, 12:00 PM

## 2011-04-24 ENCOUNTER — Encounter (INDEPENDENT_AMBULATORY_CARE_PROVIDER_SITE_OTHER): Payer: Medicaid Other | Admitting: Registered Nurse

## 2011-04-24 DIAGNOSIS — R609 Edema, unspecified: Secondary | ICD-10-CM

## 2011-05-01 ENCOUNTER — Ambulatory Visit (INDEPENDENT_AMBULATORY_CARE_PROVIDER_SITE_OTHER): Payer: Medicaid Other | Admitting: Registered Nurse

## 2011-05-01 ENCOUNTER — Encounter: Payer: Self-pay | Admitting: Registered Nurse

## 2011-05-01 VITALS — BP 118/60 | Ht 64.0 in | Wt 128.0 lb

## 2011-05-01 DIAGNOSIS — IMO0002 Reserved for concepts with insufficient information to code with codable children: Secondary | ICD-10-CM

## 2011-05-01 DIAGNOSIS — O1204 Gestational edema, complicating childbirth: Secondary | ICD-10-CM

## 2011-05-01 NOTE — Progress Notes (Signed)
O:VSS  A: PP [redacted]w[redacted]d PP. BLE resolved. Breastfeeding. Doing well.  P: has PP appt. Scheduled 05/29/2010. Rev'd signs and sx to report

## 2011-05-23 ENCOUNTER — Encounter: Payer: Self-pay | Admitting: Obstetrics and Gynecology

## 2011-05-23 ENCOUNTER — Ambulatory Visit (INDEPENDENT_AMBULATORY_CARE_PROVIDER_SITE_OTHER): Payer: Medicaid Other | Admitting: Obstetrics and Gynecology

## 2011-05-23 NOTE — Progress Notes (Signed)
Marcia Bowers  is 5 weeks 5 days postpartum following a primary cesarean section, low transverse incision at 41 gestational weeks Date:04/13/11 female baby named Lequita Halt  delivered byDr. Su Hilt  Breastfeeding: yes Bottlefeeding:  no  Post-partum blues / depression:  no  EPDS score: 1  History of abnormal Pap:  yes  Last Pap: Date  01/01/11 normal  Gestational diabetes:  no  Contraception:  Received Depo-Provera in hospital  Normal urinary function:  yes Normal GI function:  no Returning to work:  Yes  Subjective:     Marcia Bowers is a 31 y.o. female who presents for a postpartum visit.  I have fully reviewed the prenatal and intrapartum course.    Patient is not sexually active.   The following portions of the patient's history were reviewed and updated as appropriate: allergies, current medications, past family history, past medical history, past social history, past surgical history and problem list.  Review of Systems Pertinent items are noted in HPI.   Objective:    BP 100/60  Wt 132 lb (59.875 kg)  Breastfeeding? Yes  General:  alert, cooperative and no distress     Lungs: clear to auscultation bilaterally  Heart:  regular rate and rhythm, S1, S2 normal, no murmur  Abdomen: soft, non-tender; bowel sounds normal; no masses,  no organomegaly incision normal   Vulva:  normal  Vagina: normal vagina  Cervix:  normal  Corpus: normal size, contour, position, consistency, mobility, non-tender  Adnexa:  normal adnexa             Assessment:     Normal postpartum exam.  Pap smear not done at today's visit.   Plan:     1. Contraception: none 3. Follow up in: 7 months or as needed.    ZOXWRU,EAVWUJ A MD 05/23/2011 12:10 PM

## 2011-06-19 ENCOUNTER — Encounter (INDEPENDENT_AMBULATORY_CARE_PROVIDER_SITE_OTHER): Payer: Self-pay | Admitting: Surgery

## 2011-08-02 ENCOUNTER — Telehealth: Payer: Self-pay | Admitting: Obstetrics and Gynecology

## 2011-08-02 NOTE — Telephone Encounter (Signed)
Tc  To pt regarding msg, pt informed Goodie Powder is not safe with breastfeeding.  Pt voices understanding.

## 2011-08-02 NOTE — Telephone Encounter (Signed)
TRIAGE/GENERAL QUEST. °

## 2011-10-17 ENCOUNTER — Ambulatory Visit: Payer: Medicaid Other | Admitting: Obstetrics and Gynecology

## 2011-11-28 ENCOUNTER — Encounter: Payer: Self-pay | Admitting: Obstetrics and Gynecology

## 2011-11-28 ENCOUNTER — Ambulatory Visit (INDEPENDENT_AMBULATORY_CARE_PROVIDER_SITE_OTHER): Payer: BC Managed Care – PPO | Admitting: Obstetrics and Gynecology

## 2011-11-28 VITALS — BP 118/72 | Ht 63.0 in | Wt 130.0 lb

## 2011-11-28 DIAGNOSIS — A749 Chlamydial infection, unspecified: Secondary | ICD-10-CM | POA: Insufficient documentation

## 2011-11-28 DIAGNOSIS — D219 Benign neoplasm of connective and other soft tissue, unspecified: Secondary | ICD-10-CM | POA: Insufficient documentation

## 2011-11-28 DIAGNOSIS — Z01419 Encounter for gynecological examination (general) (routine) without abnormal findings: Secondary | ICD-10-CM

## 2011-11-28 DIAGNOSIS — A599 Trichomoniasis, unspecified: Secondary | ICD-10-CM | POA: Insufficient documentation

## 2011-11-28 DIAGNOSIS — B379 Candidiasis, unspecified: Secondary | ICD-10-CM | POA: Insufficient documentation

## 2011-11-28 DIAGNOSIS — B9689 Other specified bacterial agents as the cause of diseases classified elsewhere: Secondary | ICD-10-CM | POA: Insufficient documentation

## 2011-11-28 DIAGNOSIS — A549 Gonococcal infection, unspecified: Secondary | ICD-10-CM | POA: Insufficient documentation

## 2011-11-29 NOTE — Progress Notes (Signed)
Appointment was canceled.

## 2011-12-31 ENCOUNTER — Encounter (HOSPITAL_COMMUNITY): Payer: Self-pay | Admitting: Emergency Medicine

## 2011-12-31 ENCOUNTER — Emergency Department (HOSPITAL_COMMUNITY)
Admission: EM | Admit: 2011-12-31 | Discharge: 2011-12-31 | Disposition: A | Discharge status: Home / Self Care | Payer: BC Managed Care – PPO | Source: Home / Self Care | Attending: Emergency Medicine | Admitting: Emergency Medicine

## 2011-12-31 DIAGNOSIS — M26609 Unspecified temporomandibular joint disorder, unspecified side: Secondary | ICD-10-CM

## 2011-12-31 NOTE — ED Notes (Signed)
Pt c/o bilateral ear pain x1.5 weeks... Left ear hurts more than the right ... Sx include: itching and painful... Denies: drainage, fevers, vomiting, nauseas, diarrhea... She is alert w/no signs of acute distress.

## 2011-12-31 NOTE — ED Provider Notes (Signed)
History     CSN: [id number]  Arrival date & time 12/31/11  1256   First MD Initiated Contact with Patient 12/31/11 1417      Chief Complaint  Patient presents with  . Otalgia    (Consider location/radiation/quality/duration/timing/severity/associated sxs/prior treatment) HPI Comments:  Patient presents urgent care complaining of bilateral ear pain for about a week and a half. Left-sided hurts the most in comparison with the right. (Patient points to left temporomandibular joint region on her left side). Patient denies any recent cold-like symptoms, ear drainage, fevers or congestion, tinnitus, nausea or vomiting or vertigo. Pain seemed to get worse when she opens her mouth and touches certain areas around her ear more specifically in front of it.  Patient is a 31 y.o. female presenting with ear pain. The history is provided by the patient.  Otalgia This is a new problem. The current episode started more than 1 week ago. There is pain in both ears. The problem occurs constantly. The problem has not changed since onset.There has been no fever. Pertinent negatives include no ear discharge, no headaches, no hearing loss, no sore throat, no diarrhea, no vomiting, no neck pain and no rash. Her past medical history does not include chronic ear infection or tympanostomy tube.    Past Medical History  Diagnosis Date  . Anemia   . Abnormal Pap smear   . Systemic lupus erythematosus     false positive test  . BV (bacterial vaginosis)   . Yeast infection   . Trichomonas   . Chlamydia   . Gonorrhea   . Fibroids   . History of chicken pox     Past Surgical History  Procedure Date  . Wisdom tooth extraction   . Cesarean section 04/13/2011    Procedure: CESAREAN SECTION;  Surgeon: Purcell Nails, MD;  Location: [facility];  Service: Gynecology;  Laterality: N/A;  Primary cesarean section with delivery of baby girl at 45. Apgars 8/9.    Family History  Problem Relation Age of Onset  .  Anesthesia problems Neg Hx   . Thyroid disease Mother   . Hypertension Father   . Heart disease Paternal Grandfather   . Hypertension Paternal Grandfather   . Diabetes Paternal Grandfather   . Heart disease Paternal Grandmother   . Hypertension Paternal Grandmother   . Heart disease Other     Heart on the right    History  Substance Use Topics  . Smoking status: Never Smoker   . Smokeless tobacco: Never Used  . Alcohol Use: Yes     Comment: 1x month    OB History    Grav Para Term Preterm Abortions TAB SAB Ect Mult Living   1 1 1       1       Review of Systems  Constitutional: Negative for chills, activity change and appetite change.  HENT: Positive for ear pain. Negative for hearing loss, sore throat, drooling, mouth sores, trouble swallowing, neck pain, neck stiffness, dental problem and ear discharge.   Eyes: Negative for pain, discharge, redness and itching.  Gastrointestinal: Negative for vomiting and diarrhea.  Skin: Negative for rash and wound.  Neurological: Negative for dizziness, numbness and headaches.    Allergies  Review of patient's allergies indicates no known allergies.  Home Medications   Current Outpatient Rx  Name  Route  Sig  Dispense  Refill  . CYCLOBENZAPRINE HCL ER PO   Oral   Take by mouth.         Marland Kitchen  HYDROCODONE-ACETAMINOPHEN PO   Oral   Take by mouth.         Marland Kitchen VITAMIN D 1000 UNITS PO TABS   Oral   Take 1,000 Units by mouth every morning.         Marland Kitchen DOCUSATE SODIUM 100 MG PO CAPS   Oral   Take 100 mg by mouth 2 (two) times daily as needed. For constipation         . FERROUS SULFATE 325 (65 FE) MG PO TABS   Oral   Take 325 mg by mouth daily with breakfast.         . FERROUS SULFATE 325 (65 FE) MG PO TABS   Oral   Take 1 tablet (325 mg total) by mouth daily with breakfast.   60 tablet   1   . IBUPROFEN 600 MG PO TABS   Oral   Take 600 mg by mouth every 6 (six) hours as needed.         Marland Kitchen MEDROXYPROGESTERONE  ACETATE 150 MG/ML IM SUSP   Intramuscular   Inject 150 mg into the muscle every 3 (three) months.         Marland Kitchen PRENATAL MULTIVITAMIN CH   Oral   Take 1 tablet by mouth every morning.           BP 122/78  Pulse 84  Temp 98.8 F (37.1 C) (Oral)  Resp 20  SpO2 99%  Breastfeeding? Yes  Physical Exam  Nursing note and vitals reviewed. Constitutional: Vital signs are normal. She appears well-developed and well-nourished.  Non-toxic appearance. She does not have a sickly appearance. She does not appear ill.  HENT:  Head: Normocephalic.  Right Ear: Tympanic membrane and ear canal normal. No drainage or swelling. No mastoid tenderness. Tympanic membrane is not injected. No middle ear effusion.  Left Ear: Tympanic membrane and ear canal normal. No drainage or swelling. No mastoid tenderness. Tympanic membrane is not injected.  No middle ear effusion.  Mouth/Throat: No oropharyngeal exudate.  Eyes: Conjunctivae normal are normal.  Neck: Trachea normal and normal range of motion. Neck supple. No JVD present.  Cardiovascular: Normal rate.   Pulmonary/Chest: Effort normal and breath sounds normal.  Musculoskeletal: She exhibits no tenderness.  Lymphadenopathy:    She has no cervical adenopathy.  Neurological: She is alert.  Skin: No rash noted. No erythema.    ED Course  Procedures (including critical care time)  Labs Reviewed - No data to display No results found.   1. Temporal mandibular joint disorder       MDM  TMJ elicited pain with direct palpation in jaw movement. Encourage patient to use a course of NSAIDs for 5 days provided with Joe exercises. Patient agrees with treatment plan and will followup with her primary care doctor no improvement.        Jimmie Molly, MD 12/31/11 [phone]

## 2013-06-27 ENCOUNTER — Emergency Department (HOSPITAL_COMMUNITY)
Admission: EM | Admit: 2013-06-27 | Discharge: 2013-06-27 | Disposition: A | Discharge status: Home / Self Care | Payer: BC Managed Care – PPO | Source: Home / Self Care | Attending: Family Medicine | Admitting: Family Medicine

## 2013-06-27 ENCOUNTER — Encounter (HOSPITAL_COMMUNITY): Payer: Self-pay | Admitting: Emergency Medicine

## 2013-06-27 DIAGNOSIS — S29019A Strain of muscle and tendon of unspecified wall of thorax, initial encounter: Secondary | ICD-10-CM

## 2013-06-27 DIAGNOSIS — R252 Cramp and spasm: Secondary | ICD-10-CM

## 2013-06-27 DIAGNOSIS — S239XXA Sprain of unspecified parts of thorax, initial encounter: Secondary | ICD-10-CM

## 2013-06-27 DIAGNOSIS — R259 Unspecified abnormal involuntary movements: Secondary | ICD-10-CM

## 2013-06-27 DIAGNOSIS — X58XXXA Exposure to other specified factors, initial encounter: Secondary | ICD-10-CM

## 2013-06-27 DIAGNOSIS — M549 Dorsalgia, unspecified: Secondary | ICD-10-CM

## 2013-06-27 HISTORY — DX: Fibromyalgia: M79.7

## 2013-06-27 MED ORDER — DIAZEPAM 5 MG/ML IJ SOLN
INTRAMUSCULAR | Status: AC
  Filled 2013-06-27: qty 2

## 2013-06-27 MED ORDER — DIAZEPAM 5 MG/ML IJ SOLN
5.0000 mg | Freq: Once | INTRAMUSCULAR | Status: AC
  Administered 2013-06-27: 5 mg via INTRAMUSCULAR

## 2013-06-27 MED ORDER — KETOROLAC TROMETHAMINE 60 MG/2ML IM SOLN
INTRAMUSCULAR | Status: AC
  Filled 2013-06-27: qty 2

## 2013-06-27 MED ORDER — HYDROCODONE-ACETAMINOPHEN 5-325 MG PO TABS: 2.0000 | ORAL_TABLET | Freq: Four times a day (QID) | ORAL | Status: DC | PRN

## 2013-06-27 MED ORDER — IBUPROFEN 800 MG PO TABS: 800.0000 mg | ORAL_TABLET | Freq: Three times a day (TID) | ORAL | Status: AC

## 2013-06-27 MED ORDER — KETOROLAC TROMETHAMINE 60 MG/2ML IM SOLN
60.0000 mg | Freq: Once | INTRAMUSCULAR | Status: AC
  Administered 2013-06-27: 60 mg via INTRAMUSCULAR

## 2013-06-27 MED ORDER — CYCLOBENZAPRINE HCL 10 MG PO TABS: 10.0000 mg | ORAL_TABLET | Freq: Three times a day (TID) | ORAL | Status: DC | PRN

## 2013-06-27 NOTE — ED Notes (Signed)
Large ice pack placed to left upper back/neck area.

## 2013-06-27 NOTE — ED Provider Notes (Signed)
Medical screening examination/treatment/procedure(s) were performed by a resident physician or non-physician practitioner and as the supervising physician I was immediately available for consultation/collaboration.  Lynne Leader, MD    Gregor Hams, MD 06/27/13 [phone]

## 2013-06-27 NOTE — ED Notes (Signed)
Pt improving; states pain improving.  Able to sit carefully in chair and ambulate.

## 2013-06-27 NOTE — ED Provider Notes (Signed)
CSN: [id number]     Arrival date & time 06/27/13  1157 History   First MD Initiated Contact with Patient 06/27/13 1238     Chief Complaint  Patient presents with  . Neck Pain   (Consider location/radiation/quality/duration/timing/severity/associated sxs/prior Treatment) HPI Comments: Marcia Bowers presents with right upper back pain. Patient was "stretching" this am, and felt a "pull and immediate pain" in the right upper scapular region. She reports the pain is "severe" and unable to move or get comfortable. Denies weakness or numbness in the extremities. No headaches, dizziness.   Patient is a 33 y.o. female presenting with neck pain. The history is provided by the patient.  Neck Pain   Past Medical History  Diagnosis Date  . Anemia   . Abnormal Pap smear   . BV (bacterial vaginosis)   . Yeast infection   . Trichomonas   . Chlamydia   . Gonorrhea   . Fibroids   . History of chicken pox   . Fibromyalgia    Past Surgical History  Procedure Laterality Date  . Wisdom tooth extraction    . Cesarean section  04/13/2011    Procedure: CESAREAN SECTION;  Surgeon: Delice Lesch, MD;  Location: [facility];  Service: Gynecology;  Laterality: N/A;  Primary cesarean section with delivery of baby girl at 55. Apgars 8/9.   Family History  Problem Relation Age of Onset  . Anesthesia problems Neg Hx   . Thyroid disease Mother   . Hypertension Father   . Heart disease Paternal Grandfather   . Hypertension Paternal Grandfather   . Diabetes Paternal Grandfather   . Heart disease Paternal Grandmother   . Hypertension Paternal Grandmother   . Heart disease Other     Heart on the right   History  Substance Use Topics  . Smoking status: Never Smoker   . Smokeless tobacco: Never Used  . Alcohol Use: Yes     Comment: 1x month   OB History   Grav Para Term Preterm Abortions TAB SAB Ect Mult Living   1 1 1       1      Review of Systems  Musculoskeletal: Positive for neck pain.  All  other systems reviewed and are negative.   Allergies  Review of patient's allergies indicates no known allergies.  Home Medications   Prior to Admission medications   Medication Sig Start Date End Date Taking? Authorizing Provider  CYCLOBENZAPRINE HCL ER PO Take by mouth.   Yes Historical Provider, MD  FENUGREEK PO Take by mouth.   Yes Historical Provider, MD  HYDROCODONE-ACETAMINOPHEN PO Take by mouth.   Yes Historical Provider, MD  Prenatal Vit-Fe Fumarate-FA (PRENATAL MULTIVITAMIN) TABS Take 1 tablet by mouth every morning.   Yes Historical Provider, MD  cholecalciferol (VITAMIN D) 1000 UNITS tablet Take 1,000 Units by mouth every morning.    Historical Provider, MD  cyclobenzaprine (FLEXERIL) 10 MG tablet Take 1 tablet (10 mg total) by mouth 3 (three) times daily as needed for muscle spasms. 06/27/13   Bjorn Pippin, PA-C  docusate sodium (COLACE) 100 MG capsule Take 100 mg by mouth 2 (two) times daily as needed. For constipation    Historical Provider, MD  ferrous sulfate 325 (65 FE) MG tablet Take 325 mg by mouth daily with breakfast.    Historical Provider, MD  HYDROcodone-acetaminophen (NORCO/VICODIN) 5-325 MG per tablet Take 2 tablets by mouth every 6 (six) hours as needed for moderate pain. 06/27/13   Bjorn Pippin,  PA-C  ibuprofen (ADVIL,MOTRIN) 600 MG tablet Take 600 mg by mouth every 6 (six) hours as needed.    Historical Provider, MD  ibuprofen (ADVIL,MOTRIN) 800 MG tablet Take 1 tablet (800 mg total) by mouth 3 (three) times daily. 06/27/13 07/05/13  Bjorn Pippin, PA-C  medroxyPROGESTERone (DEPO-PROVERA) 150 MG/ML injection Inject 150 mg into the muscle every 3 (three) months. 04/13/11   Historical Provider, MD   BP 134/90  Pulse 114  Temp(Src) 99 F (37.2 C) (Oral)  Resp 18  SpO2 96%  LMP 06/01/2013  Breastfeeding? Yes Physical Exam  Nursing note and vitals reviewed. Constitutional: She is oriented to person, place, and time. She appears well-developed and  well-nourished. She appears distressed.  Patient is bent over lying face first on the exam table with knees on the stool. Refuses to move for me. Toradol and Valium given in hopes of obtaining an exam. Post medication patient pushed up with both arms and arose to a chair. Pain in the lower neck and upper back with palpation, but movement to head-See remainder of exam  HENT:  Head: Normocephalic and atraumatic.  Musculoskeletal: She exhibits tenderness. She exhibits no edema.  Right upper para scapular region with noted spasm, pain to light or deep palpation. No pain along cervical spine or thoracic spine. No ecchymosis noted.  Neurological: She is alert and oriented to person, place, and time. She displays normal reflexes. No cranial nerve deficit. She exhibits normal muscle tone. Coordination normal.  Full neuro exam normal  Skin: Skin is warm and dry. She is not diaphoretic. No erythema.  Psychiatric: Her behavior is normal.    ED Course  Procedures (including critical care time) Labs Review Labs Reviewed - No data to display  Imaging Review No results found.   MDM   1. Thoracic myofascial strain   2. Upper back pain   3. Spasm    No indication for a xray, spasms noted on exam. Improved following Toradol and IM Valium. Neuro exam intact. [place] for home with combination of NSAIDs, Muscle Relaxer and Pain medications. Instructions for icing, rest. Return if any weakness or worsening. [place] feeding instructions given to avoid while on medications.     Marcia BARSAMIAN, PA-C 06/27/13 1351

## 2013-06-27 NOTE — Discharge Instructions (Signed)
Back Pain, Adult Back pain is very common. The pain often gets better over time. The cause of back pain is usually not dangerous. Most people can learn to manage their back pain on their own.  HOME CARE   Stay active. Start with short walks on flat ground if you can. Try to walk farther each day.  Do not sit, drive, or stand in one place for more than 30 minutes. Do not stay in bed.  Do not avoid exercise or work. Activity can help your back heal faster.  Be careful when you bend or lift an object. Bend at your knees, keep the object close to you, and do not twist.  Sleep on a firm mattress. Lie on your side, and bend your knees. If you lie on your back, put a pillow under your knees.  Only take medicines as told by your doctor.  Put ice on the injured area.  Put ice in a plastic bag.  Place a towel between your skin and the bag.  Leave the ice on for 15-20 minutes, 03-04 times a day for the first 2 to 3 days. After that, you can switch between ice and heat packs.  Ask your doctor about back exercises or massage.  Avoid feeling anxious or stressed. Find good ways to deal with stress, such as exercise. GET HELP RIGHT AWAY IF:   Your pain does not go away with rest or medicine.  Your pain does not go away in 1 week.  You have new problems.  You do not feel well.  The pain spreads into your legs.  You cannot control when you poop (bowel movement) or pee (urinate).  Your arms or legs feel weak or lose feeling (numbness).  You feel sick to your stomach (nauseous) or throw up (vomit).  You have belly (abdominal) pain.  You feel like you may pass out (faint). MAKE SURE YOU:   Understand these instructions.  Will watch your condition.  Will get help right away if you are not doing well or get worse. Document Released: 06/26/2007 Document Revised: 04/01/2011 Document Reviewed: 05/28/2010 [facility] Patient Information 2014 [place].  Muscle Strain A muscle  strain (pulled muscle) happens when a muscle is stretched beyond normal length. It happens when a sudden, violent force stretches your muscle too far. Usually, a few of the fibers in your muscle are torn. Muscle strain is common in athletes. Recovery usually takes 1 2 weeks. Complete healing takes 5 6 weeks.  HOME CARE   Follow the PRICE method of treatment to help your injury get better. Do this the first 2 3 days after the injury:  Protect. Protect the muscle to keep it from getting injured again.  Rest. Limit your activity and rest the injured body part.  Ice. Put ice in a plastic bag. Place a towel between your skin and the bag. Then, apply the ice and leave it on from 15 20 minutes each hour. After the third day, switch to moist heat packs.  Compression. Use a splint or elastic bandage on the injured area for comfort. Do not put it on too tightly.  Elevate. Keep the injured body part above the level of your heart.  Only take medicine as told by your doctor.  Warm up before doing exercise to prevent future muscle strains. GET HELP IF:   You have more pain or puffiness (swelling) in the injured area.  You feel numbness, tingling, or notice a loss of strength in the  injured area. MAKE SURE YOU:   Understand these instructions.  Will watch your condition.  Will get help right away if you are not doing well or get worse. Document Released: 10/17/2007 Document Revised: 10/28/2012 Document Reviewed: 08/06/2012 [facility] Patient Information 2014 [place], [place].    Use Ice to area, not heat, 20 minutes on and 20 off throughout day. Take Ibuprofen rx 3 x a day until completed RX. Use Flexeril for spasms and Vicodin for moderate pain only. Gentle massage will help if someone can help you with that. Avoid breast feeding for the next week while on the medications as we discussed. F/U if worsening pain or weakness.

## 2013-06-27 NOTE — ED Notes (Signed)
States was laying down and stretched this morning and felt sudden onset "grinding, and then a pop".  C/O severe muscle spasms in left lateral neck radiating down into left upper back.  States she is able to move around, but even talking causes her neck muscles to spasm.  Has not taken any meds to help alleviate pain.

## 2013-07-19 DIAGNOSIS — Z8619 Personal history of other infectious and parasitic diseases: Secondary | ICD-10-CM | POA: Insufficient documentation

## 2013-11-22 ENCOUNTER — Encounter (HOSPITAL_COMMUNITY): Payer: Self-pay | Admitting: Emergency Medicine

## 2015-08-04 ENCOUNTER — Encounter (HOSPITAL_COMMUNITY): Payer: Self-pay | Admitting: Emergency Medicine

## 2015-08-04 ENCOUNTER — Ambulatory Visit (HOSPITAL_COMMUNITY)
Admission: EM | Admit: 2015-08-04 | Discharge: 2015-08-04 | Disposition: A | Discharge status: Home / Self Care | Payer: BLUE CROSS/BLUE SHIELD | Attending: Internal Medicine | Admitting: Internal Medicine

## 2015-08-04 DIAGNOSIS — N898 Other specified noninflammatory disorders of vagina: Secondary | ICD-10-CM | POA: Diagnosis not present

## 2015-08-04 NOTE — ED Provider Notes (Signed)
CSN: [id number]     Arrival date & time 08/04/15  1531 History   First MD Initiated Contact with Patient 08/04/15 1625     Chief Complaint  Patient presents with  . SEXUALLY TRANSMITTED DISEASE   (Consider location/radiation/quality/duration/timing/severity/associated sxs/prior Treatment) HPI Comments: Patient presents with vaginal discharge and vaginal "irritation". She had unprotected intercourse 48 hours ago and this was first time in a while. She would like to get checked out. No fever, chills, pelvic pain. No urinary symptoms.   The history is provided by the patient.    Past Medical History  Diagnosis Date  . Anemia   . Abnormal Pap smear   . BV (bacterial vaginosis)   . Yeast infection   . Trichomonas   . Chlamydia   . Gonorrhea   . Fibroids   . History of chicken pox   . Fibromyalgia    Past Surgical History  Procedure Laterality Date  . Wisdom tooth extraction    . Cesarean section  04/13/2011    Procedure: CESAREAN SECTION;  Surgeon: Delice Lesch, MD;  Location: [facility];  Service: Gynecology;  Laterality: N/A;  Primary cesarean section with delivery of baby girl at 24. Apgars 8/9.   Family History  Problem Relation Age of Onset  . Anesthesia problems Neg Hx   . Thyroid disease Mother   . Hypertension Father   . Heart disease Paternal Grandfather   . Hypertension Paternal Grandfather   . Diabetes Paternal Grandfather   . Heart disease Paternal Grandmother   . Hypertension Paternal Grandmother   . Heart disease Other     Heart on the right   Social History  Substance Use Topics  . Smoking status: Never Smoker   . Smokeless tobacco: Never Used  . Alcohol Use: Yes     Comment: 1x month   OB History    Gravida Para Term Preterm AB TAB SAB Ectopic Multiple Living   1 1 1       1      Review of Systems  Constitutional: Negative for fever and chills.  Gastrointestinal: Negative for nausea, vomiting and abdominal pain.  Genitourinary: Positive for  vaginal discharge. Negative for urgency, vaginal pain and pelvic pain.    Allergies  Review of patient's allergies indicates no known allergies.  Home Medications   Prior to Admission medications   Medication Sig Start Date End Date Taking? Authorizing Provider  cholecalciferol (VITAMIN D) 1000 UNITS tablet Take 1,000 Units by mouth every morning.    Historical Provider, MD  cyclobenzaprine (FLEXERIL) 10 MG tablet Take 1 tablet (10 mg total) by mouth 3 (three) times daily as needed for muscle spasms. 06/27/13   Bjorn Pippin, PA-C  CYCLOBENZAPRINE HCL ER PO Take by mouth.    Historical Provider, MD  docusate sodium (COLACE) 100 MG capsule Take 100 mg by mouth 2 (two) times daily as needed. For constipation    Historical Provider, MD  FENUGREEK PO Take by mouth.    Historical Provider, MD  ferrous sulfate 325 (65 FE) MG tablet Take 325 mg by mouth daily with breakfast.    Historical Provider, MD  HYDROcodone-acetaminophen (NORCO/VICODIN) 5-325 MG per tablet Take 2 tablets by mouth every 6 (six) hours as needed for moderate pain. 06/27/13   Bjorn Pippin, PA-C  HYDROCODONE-ACETAMINOPHEN PO Take by mouth.    Historical Provider, MD  ibuprofen (ADVIL,MOTRIN) 600 MG tablet Take 600 mg by mouth every 6 (six) hours as needed.    Historical Provider,  MD  medroxyPROGESTERone (DEPO-PROVERA) 150 MG/ML injection Inject 150 mg into the muscle every 3 (three) months. 04/13/11   Historical Provider, MD  Prenatal Vit-Fe Fumarate-FA (PRENATAL MULTIVITAMIN) TABS Take 1 tablet by mouth every morning.    Historical Provider, MD   Meds Ordered and Administered this Visit  Medications - No data to display  BP 124/85 mmHg  Pulse 94  Temp(Src) 98.4 F (36.9 C) (Oral)  Resp 16  SpO2 100%  LMP 07/25/2015 No data found.   Physical Exam  Constitutional: She is oriented to person, place, and time. She appears well-developed and well-nourished. No distress.  Genitourinary:  No lesions. Vaginal thin grey  discharge, no odor. Cervix normal.   Neurological: She is alert and oriented to person, place, and time.  Skin: Skin is warm and dry. No rash noted. She is not diaphoretic.  Psychiatric: Her behavior is normal.  Nursing note and vitals reviewed.   ED Course  Procedures (including critical care time)  Labs Review Labs Reviewed  CERVICOVAGINAL ANCILLARY ONLY    Imaging Review No results found.   Visual Acuity Review  Right Eye Distance:   Left Eye Distance:   Bilateral Distance:    Right Eye Near:   Left Eye Near:    Bilateral Near:         MDM   1. Vaginal discharge    STD check per patient. She has treatment for BV at home she tells me, so will use this and I agreed, this resembles most c/w BV. F/U as needed.     KAMOR GABOR, PA-C 08/04/15 1659

## 2015-08-04 NOTE — ED Notes (Signed)
PT reports she had intercourse Wednesday night/ Thursday morning. This morning, PT is experiencing increased discharge and lower abdominal discomfort. PT reports she has history of repeat yeast infections.

## 2015-08-04 NOTE — Discharge Instructions (Signed)
Everything looks ok, we will run a STD panel and call you. Ok to treat with your BV medications at this time. Will follow up

## 2015-08-07 LAB — CERVICOVAGINAL ANCILLARY ONLY
Chlamydia: NEGATIVE
Neisseria Gonorrhea: NEGATIVE

## 2015-08-08 LAB — CERVICOVAGINAL ANCILLARY ONLY: WET PREP (BD AFFIRM): POSITIVE — AB

## 2015-08-11 ENCOUNTER — Telehealth (HOSPITAL_COMMUNITY): Payer: Self-pay | Admitting: Internal Medicine

## 2015-08-11 MED ORDER — METRONIDAZOLE 500 MG PO TABS: 500.0000 mg | ORAL_TABLET | Freq: Two times a day (BID) | ORAL | Status: DC

## 2015-08-11 NOTE — Telephone Encounter (Deleted)
Clinical staff, please let patient know that test for gardnerella (bacterial vaginosis) was positive.  Rx for metronidazole sent to pharmacy of record, [place] [facility].  Recheck as needed.  LM

## 2015-08-11 NOTE — Telephone Encounter (Signed)
Clinical staff, please let patient know that test for gardnerella (bacterial vaginosis) was positive; visit note says patient has rx for this at home.  Recheck as needed.  LM

## 2016-01-26 ENCOUNTER — Other Ambulatory Visit: Payer: Self-pay | Admitting: Rheumatology

## 2016-01-26 ENCOUNTER — Telehealth: Payer: Self-pay | Admitting: Rheumatology

## 2016-01-26 NOTE — Telephone Encounter (Signed)
-----   Message from Carole Binning, LPN sent at [id number]  9:17 AM EST ----- Regarding: Please schedule patient for follow appointment Please schedule patient for follow up appointment. She is due February 2018. Thanks!

## 2016-01-26 NOTE — Telephone Encounter (Signed)
Last Visit: 10/04/15 Next Visit due February 2018. Message sent to the front to schedule patient. Labs: 10/06/15 Low Vitamin D  Okay to refill Flexeril?

## 2016-01-26 NOTE — Telephone Encounter (Signed)
ok 

## 2016-01-26 NOTE — Telephone Encounter (Signed)
LMOM for patient to call back to schedule appointment.  

## 2016-01-30 ENCOUNTER — Encounter: Payer: Self-pay | Admitting: Obstetrics and Gynecology

## 2016-01-30 DIAGNOSIS — M329 Systemic lupus erythematosus, unspecified: Secondary | ICD-10-CM | POA: Insufficient documentation

## 2016-01-30 HISTORY — DX: Systemic lupus erythematosus, unspecified: M32.9

## 2016-03-19 DIAGNOSIS — M7918 Myalgia, other site: Secondary | ICD-10-CM | POA: Insufficient documentation

## 2016-03-19 DIAGNOSIS — M25561 Pain in right knee: Secondary | ICD-10-CM | POA: Insufficient documentation

## 2016-03-19 DIAGNOSIS — Z8639 Personal history of other endocrine, nutritional and metabolic disease: Secondary | ICD-10-CM | POA: Insufficient documentation

## 2016-03-19 DIAGNOSIS — G8929 Other chronic pain: Secondary | ICD-10-CM | POA: Insufficient documentation

## 2016-03-19 DIAGNOSIS — M25552 Pain in left hip: Secondary | ICD-10-CM

## 2016-03-19 DIAGNOSIS — M1990 Unspecified osteoarthritis, unspecified site: Secondary | ICD-10-CM | POA: Insufficient documentation

## 2016-03-19 DIAGNOSIS — M25562 Pain in left knee: Secondary | ICD-10-CM

## 2016-03-19 DIAGNOSIS — M25551 Pain in right hip: Secondary | ICD-10-CM | POA: Insufficient documentation

## 2016-03-19 DIAGNOSIS — R5383 Other fatigue: Secondary | ICD-10-CM | POA: Insufficient documentation

## 2016-03-19 DIAGNOSIS — M791 Myalgia, unspecified site: Secondary | ICD-10-CM | POA: Insufficient documentation

## 2016-03-19 NOTE — Progress Notes (Signed)
Office Visit Note  Patient: Marcia Bowers             Date of Birth: 1980/03/19           MRN: [id number]             PCP: Lucila Maine Referring: Teena Irani, PA-C Visit Date: 03/21/2016 Occupation: @GUAROCC @    Subjective:  Follow-up Fibromyalgia.  History of Present Illness: Marcia Bowers is a 36 y.o. female  Last seen 10/04/2015. Patient rates her fibromyalgia discomfort as 7-10 on a scale of 0-10 lately.   Patient also has a history of bilateral knee joint with moderate osteoarthritis and occasional pain.  Patient does not like to use medications and avoids Flexeril at night. Then patient will flare from her fibromyalgia and will end up needing hydrocodone on some occasions. We spent a long time discussing the importance of avoiding hydrocodone 1 possible and using are other medications early enough to get benefit from them so she does not flare and have to rely on hydrocodone for pain management.  Patient insists "I don't like a lot of medicine in me". 2 which I advised her that it's better to have the Robaxin on board and Flexeril when needed then to flare and ended up using hydrocodone since it has a very addictive nature.  Activities of Daily Living:  Patient reports morning stiffness for 15 minutes.   Patient Reports nocturnal pain.  Difficulty dressing/grooming: Denies Difficulty climbing stairs: Reports Difficulty getting out of chair: Reports Difficulty using hands for taps, buttons, cutlery, and/or writing: Denies   Review of Systems  Constitutional: Positive for fatigue.  HENT: Negative for mouth sores and mouth dryness.   Eyes: Negative for dryness.  Respiratory: Negative for shortness of breath.   Gastrointestinal: Negative for constipation and diarrhea.  Musculoskeletal: Positive for myalgias and myalgias.  Skin: Negative for sensitivity to sunlight.  Psychiatric/Behavioral: Positive for sleep disturbance. Negative for decreased  concentration.    PMFS History:  Patient Active Problem List   Diagnosis Date Noted  . Myofascial pain syndrome 03/19/2016  . Other chronic pain 03/19/2016  . Other fatigue 03/19/2016  . Myalgia 03/19/2016  . Pain of both hip joints 03/19/2016  . Arthritis, senescent 03/19/2016  . Arthralgia of both knees 03/19/2016  . History of vitamin D deficiency 03/19/2016  . BV (bacterial vaginosis)   . Yeast infection   . Trichomonas   . Chlamydia   . Gonorrhea   . Fibroids   . Umbilical hernia 01/24/2011  . UNSPECIFIED VITAMIN D DEFICIENCY 06/03/2008  . ABNORMAL PAP SMEAR, LGSIL 08/21/2007  . BACK PAIN, LUMBAR 07/30/2007  . BACK PAIN, UPPER 07/30/2007  . MIGRAINE HEADACHE 07/02/2007  . DENTAL CARIES 07/02/2007  . LEG PAIN, CHRONIC 07/02/2007    Past Medical History:  Diagnosis Date  . Abnormal Pap smear   . Anemia   . BV (bacterial vaginosis)   . Chlamydia   . Fibroids   . Fibromyalgia   . Gonorrhea   . History of chicken pox   . Trichomonas   . Yeast infection     Family History  Problem Relation Age of Onset  . Thyroid disease Mother   . Hypertension Father   . Heart disease Other     Heart on the right  . Heart disease Paternal Grandfather   . Hypertension Paternal Grandfather   . Diabetes Paternal Grandfather   . Heart disease Paternal Grandmother   . Hypertension Paternal Grandmother   .  Anesthesia problems Neg Hx    Past Surgical History:  Procedure Laterality Date  . CESAREAN SECTION  04/13/2011   Procedure: CESAREAN SECTION;  Surgeon: Purcell Nails, MD;  Location: [facility];  Service: Gynecology;  Laterality: N/A;  Primary cesarean section with delivery of baby girl at 67. Apgars 8/9.  Marland Kitchen WISDOM TOOTH EXTRACTION     Social History   Social History Narrative  . No narrative on file     Objective: Vital Signs: BP 111/66   Pulse 97   Resp 14   Ht 5\' 3"  (1.6 m)   Wt 142 lb (64.4 kg)   BMI 25.15 kg/m    Physical Exam  Constitutional: She is  oriented to person, place, and time. She appears well-developed and well-nourished.  HENT:  Head: Normocephalic and atraumatic.  Eyes: EOM are normal. Pupils are equal, round, and reactive to light.  Cardiovascular: Normal rate, regular rhythm and normal heart sounds.  Exam reveals no gallop and no friction rub.   No murmur heard. Pulmonary/Chest: Effort normal and breath sounds normal. She has no wheezes. She has no rales.  Abdominal: Soft. Bowel sounds are normal. She exhibits no distension. There is no tenderness. There is no guarding. No hernia.  Musculoskeletal: Normal range of motion. She exhibits no edema, tenderness or deformity.  Lymphadenopathy:    She has no cervical adenopathy.  Neurological: She is alert and oriented to person, place, and time. Coordination normal.  Skin: Skin is warm and dry. Capillary refill takes less than 2 seconds. No rash noted.  Psychiatric: She has a normal mood and affect. Her behavior is normal.  Nursing note and vitals reviewed.    Musculoskeletal Exam:  FROM all joints Good and equal grip strength Fms 18/18  CDAI Exam: CDAI Homunculus Exam:   Joint Counts:  CDAI Tender Joint count: 0 CDAI Swollen Joint count: 0  Global Assessments:  Patient Global Assessment: 10 Provider Global Assessment: 10  CDAI Calculated Score: 20    Investigation: Findings:  March 2016:  Her x-rays of bilateral hip joints were done, which were reviewed today.  They were within normal limits.  X-rays of bilateral knee joints were obtained today, 2 views, which showed bilateral moderate medial compartment narrowing and minimal chondrocalcinosis was noted.  No significant patellofemoral narrowing was noted.  These findings were consistent with moderate osteoarthritis of her knee joints.  Had positive ANA in the past     Office Visit on 03/21/2016  Component Date Value Ref Range Status  . WBC 03/21/2016 8.2  3.8 - 10.8 K/uL Final  . RBC 03/21/2016 4.04  3.80  - 5.10 MIL/uL Final  . Hemoglobin 03/21/2016 11.6* 11.7 - 15.5 g/dL Final  . HCT 40/98/1191 36.7  35.0 - 45.0 % Final  . MCV 03/21/2016 90.8  80.0 - 100.0 fL Final  . MCH 03/21/2016 28.7  27.0 - 33.0 pg Final  . MCHC 03/21/2016 31.6* 32.0 - 36.0 g/dL Final  . RDW 47/82/9562 13.1  11.0 - 15.0 % Final  . Platelets 03/21/2016 348  140 - 400 K/uL Final  . MPV 03/21/2016 9.7  7.5 - 12.5 fL Final  . Neutro Abs 03/21/2016 5330  1,500 - 7,800 cells/uL Final  . Lymphs Abs 03/21/2016 2132  850 - 3,900 cells/uL Final  . Monocytes Absolute 03/21/2016 574  200 - 950 cells/uL Final  . Eosinophils Absolute 03/21/2016 164  15 - 500 cells/uL Final  . Basophils Absolute 03/21/2016 0  0 - 200 cells/uL Final  .  Neutrophils Relative % 03/21/2016 65  % Final  . Lymphocytes Relative 03/21/2016 26  % Final  . Monocytes Relative 03/21/2016 7  % Final  . Eosinophils Relative 03/21/2016 2  % Final  . Basophils Relative 03/21/2016 0  % Final  . Smear Review 03/21/2016 Criteria for review not met   Final  . Sodium 03/21/2016 138  135 - 146 mmol/L Final  . Potassium 03/21/2016 4.0  3.5 - 5.3 mmol/L Final  . Chloride 03/21/2016 104  98 - 110 mmol/L Final  . CO2 03/21/2016 27  20 - 31 mmol/L Final  . Glucose, Bld 03/21/2016 81  65 - 99 mg/dL Final  . BUN 82/95/6213 10  7 - 25 mg/dL Final  . Creat 08/65/7846 0.84  0.50 - 1.10 mg/dL Final  . Total Bilirubin 03/21/2016 0.9  0.2 - 1.2 mg/dL Final  . Alkaline Phosphatase 03/21/2016 39  33 - 115 U/L Final  . AST 03/21/2016 18  10 - 30 U/L Final  . ALT 03/21/2016 12  6 - 29 U/L Final  . Total Protein 03/21/2016 6.9  6.1 - 8.1 g/dL Final  . Albumin 96/29/5284 3.9  3.6 - 5.1 g/dL Final  . Calcium 13/24/4010 9.1  8.6 - 10.2 mg/dL Final  . GFR, Est African American 03/21/2016 >89  >=60 mL/min Final  . GFR, Est Non African American 03/21/2016 >89  >=60 mL/min Final       Imaging: No results found.  Speciality Comments: No specialty comments  available.    Procedures:  No procedures performed Allergies: Patient has no known allergies.   Assessment / Plan:     Visit Diagnoses: Myofascial pain syndrome - Plan: CBC with Differential/Platelet, COMPLETE METABOLIC PANEL WITH GFR  Other chronic pain  Other fatigue - Plan: CBC with Differential/Platelet, COMPLETE METABOLIC PANEL WITH GFR  Myalgia  Pain of both hip joints  Other type of osteoarthritis, unspecified site  Arthralgia of both knees  History of vitamin D deficiency     Plan: #1: Fibromyalgia. Doing well. But currently having a flare. She rates her discomfort as 10 on a scale of 0-10.  #2: Fatigue. Ongoing. #3: Insomnia. Not using Flexeril as prescribed. We discussed the proper way of using it.  #4: Bilateral knee pain. Consistent with moderate osteoarthritis. Patient had x-rays of the last visit in September 2017  #5: Patient does not have a muscle relaxer for the daytime and I will order Robaxin 500 mg 1 at 7 AM 1 at 2 PM when necessary She knows to use the Flexeril only at night.  #6: Patient avoids medications like Robaxin and Flexeril but when she flares she ends up using hydrocodone. We had a long discussion on on the addictive nature of hydrocodone and other narcotics and the importance of avoiding those medications as much as possible and to use Flexeril Robaxin to manage the fibromyalgia better.  #7: CBC with differential, CMP with GFR today   Orders: Orders Placed This Encounter  Procedures  . CBC with Differential/Platelet  . COMPLETE METABOLIC PANEL WITH GFR   Meds ordered this encounter  Medications  . methocarbamol (ROBAXIN) 500 MG tablet    Sig: Take 1 tablet (500 mg total) by mouth 2 (two) times daily as needed for muscle spasms.    Dispense:  60 tablet    Refill:  5    Order Specific Question:   Supervising Provider    Answer:   Pollyann Savoy [phone]    Face-to-face time spent with patient  was 30 minutes. 50% of time was  spent in counseling and coordination of care.  Follow-Up Instructions: Return in about 5 months (around 08/21/2016) for FMS, FATIGUE,INSOMNIA.   Tawni Pummel, PA-C  Note - This record has been created using [employer].  Chart creation errors have been sought, but may not always  have been located. Such creation errors do not reflect on  the standard of medical care.

## 2016-03-21 ENCOUNTER — Encounter: Payer: Self-pay | Admitting: Rheumatology

## 2016-03-21 ENCOUNTER — Ambulatory Visit (INDEPENDENT_AMBULATORY_CARE_PROVIDER_SITE_OTHER): Payer: BLUE CROSS/BLUE SHIELD | Admitting: Rheumatology

## 2016-03-21 VITALS — BP 111/66 | HR 97 | Resp 14 | Ht 63.0 in | Wt 142.0 lb

## 2016-03-21 DIAGNOSIS — M791 Myalgia, unspecified site: Secondary | ICD-10-CM

## 2016-03-21 DIAGNOSIS — M1990 Unspecified osteoarthritis, unspecified site: Secondary | ICD-10-CM | POA: Diagnosis not present

## 2016-03-21 DIAGNOSIS — G8929 Other chronic pain: Secondary | ICD-10-CM | POA: Diagnosis not present

## 2016-03-21 DIAGNOSIS — M25552 Pain in left hip: Secondary | ICD-10-CM | POA: Diagnosis not present

## 2016-03-21 DIAGNOSIS — M7918 Myalgia, other site: Secondary | ICD-10-CM

## 2016-03-21 DIAGNOSIS — R5383 Other fatigue: Secondary | ICD-10-CM | POA: Diagnosis not present

## 2016-03-21 DIAGNOSIS — M25562 Pain in left knee: Secondary | ICD-10-CM

## 2016-03-21 DIAGNOSIS — M25561 Pain in right knee: Secondary | ICD-10-CM

## 2016-03-21 DIAGNOSIS — Z8639 Personal history of other endocrine, nutritional and metabolic disease: Secondary | ICD-10-CM

## 2016-03-21 DIAGNOSIS — M25551 Pain in right hip: Secondary | ICD-10-CM

## 2016-03-21 LAB — CBC WITH DIFFERENTIAL/PLATELET
BASOS ABS: 0 {cells}/uL (ref 0–200)
Basophils Relative: 0 %
EOS ABS: 164 {cells}/uL (ref 15–500)
EOS PCT: 2 %
HCT: 36.7 % (ref 35.0–45.0)
Hemoglobin: 11.6 g/dL — ABNORMAL LOW (ref 11.7–15.5)
Lymphocytes Relative: 26 %
Lymphs Abs: 2132 cells/uL (ref 850–3900)
MCH: 28.7 pg (ref 27.0–33.0)
MCHC: 31.6 g/dL — ABNORMAL LOW (ref 32.0–36.0)
MCV: 90.8 fL (ref 80.0–100.0)
MONOS PCT: 7 %
MPV: 9.7 fL (ref 7.5–12.5)
Monocytes Absolute: 574 cells/uL (ref 200–950)
NEUTROS PCT: 65 %
Neutro Abs: 5330 cells/uL (ref 1500–7800)
PLATELETS: 348 10*3/uL (ref 140–400)
RBC: 4.04 MIL/uL (ref 3.80–5.10)
RDW: 13.1 % (ref 11.0–15.0)
WBC: 8.2 10*3/uL (ref 3.8–10.8)

## 2016-03-21 LAB — COMPLETE METABOLIC PANEL WITH GFR
ALBUMIN: 3.9 g/dL (ref 3.6–5.1)
ALK PHOS: 39 U/L (ref 33–115)
ALT: 12 U/L (ref 6–29)
AST: 18 U/L (ref 10–30)
BILIRUBIN TOTAL: 0.9 mg/dL (ref 0.2–1.2)
BUN: 10 mg/dL (ref 7–25)
CO2: 27 mmol/L (ref 20–31)
Calcium: 9.1 mg/dL (ref 8.6–10.2)
Chloride: 104 mmol/L (ref 98–110)
Creat: 0.84 mg/dL (ref 0.50–1.10)
GLUCOSE: 81 mg/dL (ref 65–99)
Potassium: 4 mmol/L (ref 3.5–5.3)
SODIUM: 138 mmol/L (ref 135–146)
TOTAL PROTEIN: 6.9 g/dL (ref 6.1–8.1)

## 2016-03-21 MED ORDER — METHOCARBAMOL 500 MG PO TABS: 500.0000 mg | ORAL_TABLET | Freq: Two times a day (BID) | ORAL | 5 refills | Status: AC | PRN

## 2016-08-21 ENCOUNTER — Ambulatory Visit: Payer: BLUE CROSS/BLUE SHIELD | Admitting: Rheumatology

## 2016-10-27 NOTE — Progress Notes (Signed)
Office Visit Note  Patient: Marcia Bowers             Date of Birth: 1980-08-12           MRN: [id number]             PCP: Lucila Maine Referring: Teena Irani, PA-C Visit Date: 11/06/2016 Occupation: @GUAROCC @    Subjective:  Generalized pain.   History of Present Illness: Marcia Bowers is a 36 y.o. female with history of myofascial pain syndrome and osteoarthritis. She states she has off-and-on flares of myofascial pain syndrome with pain involving her upper mid and lower back. She also has discomfort in her lower extremities. She also continues to have some discomfort in her knee joints.  Activities of Daily Living:  Patient reports morning stiffness for 10 minutes.   Patient Reports nocturnal pain.  Difficulty dressing/grooming: Reports Difficulty climbing stairs: Denies Difficulty getting out of chair: Denies Difficulty using hands for taps, buttons, cutlery, and/or writing: Reports   Review of Systems  Constitutional: Positive for fatigue. Negative for night sweats, weight gain, weight loss and weakness.  HENT: Negative for mouth sores, trouble swallowing, trouble swallowing, mouth dryness and nose dryness.   Eyes: Negative.  Negative for pain, redness, visual disturbance and dryness.  Respiratory: Negative.  Negative for cough, shortness of breath and difficulty breathing.   Cardiovascular: Negative.  Negative for chest pain, palpitations, hypertension, irregular heartbeat and swelling in legs/feet.  Gastrointestinal: Positive for constipation. Negative for blood in stool and diarrhea.  Endocrine: Negative for increased urination.  Genitourinary: Negative for vaginal dryness.  Musculoskeletal: Positive for arthralgias, joint pain and muscle tenderness. Negative for joint swelling, myalgias, muscle weakness, morning stiffness and myalgias.  Skin: Negative.  Negative for color change, rash, hair loss, skin tightness, ulcers and sensitivity to sunlight.    Allergic/Immunologic: Negative for susceptible to infections.  Neurological: Negative for dizziness, numbness, headaches, memory loss and night sweats.  Hematological: Negative for swollen glands.  Psychiatric/Behavioral: Negative.  Negative for depressed mood and sleep disturbance. The patient is not nervous/anxious.     PMFS History:  Patient Active Problem List   Diagnosis Date Noted  . Primary osteoarthritis of both knees 11/05/2016  . Myofascial pain syndrome 03/19/2016  . Other chronic pain 03/19/2016  . Other fatigue 03/19/2016  . Myalgia 03/19/2016  . Pain of both hip joints 03/19/2016  . Arthritis, senescent 03/19/2016  . Arthralgia of both knees 03/19/2016  . History of vitamin D deficiency 03/19/2016  . BV (bacterial vaginosis)   . Yeast infection   . Trichomonas   . Chlamydia   . Gonorrhea   . Fibroids   . Umbilical hernia 01/24/2011  . UNSPECIFIED VITAMIN D DEFICIENCY 06/03/2008  . ABNORMAL PAP SMEAR, LGSIL 08/21/2007  . BACK PAIN, LUMBAR 07/30/2007  . BACK PAIN, UPPER 07/30/2007  . MIGRAINE HEADACHE 07/02/2007  . DENTAL CARIES 07/02/2007  . LEG PAIN, CHRONIC 07/02/2007    Past Medical History:  Diagnosis Date  . Abnormal Pap smear   . Anemia   . BV (bacterial vaginosis)   . Chlamydia   . Fibroids   . Fibromyalgia   . Gonorrhea   . History of chicken pox   . Trichomonas   . Yeast infection     Family History  Problem Relation Age of Onset  . Thyroid disease Mother   . Hypertension Father   . Heart disease Other        Heart on the right  .  Heart disease Paternal Grandfather   . Hypertension Paternal Grandfather   . Diabetes Paternal Grandfather   . Heart disease Paternal Grandmother   . Hypertension Paternal Grandmother   . Anesthesia problems Neg Hx    Past Surgical History:  Procedure Laterality Date  . CESAREAN SECTION  04/13/2011   Procedure: CESAREAN SECTION;  Surgeon: Purcell Nails, MD;  Location: [facility];  Service: Gynecology;   Laterality: N/A;  Primary cesarean section with delivery of baby girl at 39. Apgars 8/9.  Marland Kitchen WISDOM TOOTH EXTRACTION     Social History   Social History Narrative  . No narrative on file     Objective: Vital Signs: BP 107/72 (BP Location: Left Arm, Patient Position: Sitting, Cuff Size: Normal)   Pulse 100   Ht 5\' 3"  (1.6 m)   Wt 140 lb (63.5 kg)   BMI 24.80 kg/m    Physical Exam  Constitutional: She is oriented to person, place, and time. She appears well-developed and well-nourished.  HENT:  Head: Normocephalic and atraumatic.  Eyes: Conjunctivae and EOM are normal.  Neck: Normal range of motion.  Cardiovascular: Normal rate, regular rhythm, normal heart sounds and intact distal pulses.   Pulmonary/Chest: Effort normal and breath sounds normal.  Abdominal: Soft. Bowel sounds are normal.  Lymphadenopathy:    She has no cervical adenopathy.  Neurological: She is alert and oriented to person, place, and time.  Skin: Skin is warm and dry. Capillary refill takes less than 2 seconds.  Psychiatric: She has a normal mood and affect. Her behavior is normal.  Nursing note and vitals reviewed.    Musculoskeletal Exam: cervical, thoracis and lumbar spine are in good range of motion. She had bilateral trapezius spasm. Shoulder, elbows, wrists, MCPs, PIPs , DIPs were in good ROM with no synovitis. She had tenderness over bilateral trochanteric bursa, hips, knees, ankles, MTPs and PIPs were in good ROM  With no synovitis.  CDAI Exam: No CDAI exam completed.    Investigation: No additional findings. CBC Latest Ref Rng & Units 03/21/2016 04/16/2011 04/14/2011  WBC 3.8 - 10.8 K/uL 8.2 21.2(H) 24.5(H)  Hemoglobin 11.7 - 15.5 g/dL 11.6(L) 7.8(L) 8.4(L)  Hematocrit 35.0 - 45.0 % 36.7 23.7(L) 26.1(L)  Platelets 140 - 400 K/uL 348 217 158   CMP     Component Value Date/Time   NA 138 03/21/2016 1634   K 4.0 03/21/2016 1634   CL 104 03/21/2016 1634   CO2 27 03/21/2016 1634   GLUCOSE 81  03/21/2016 1634   BUN 10 03/21/2016 1634   CREATININE 0.84 03/21/2016 1634   CALCIUM 9.1 03/21/2016 1634   PROT 6.9 03/21/2016 1634   ALBUMIN 3.9 03/21/2016 1634   AST 18 03/21/2016 1634   ALT 12 03/21/2016 1634   ALKPHOS 39 03/21/2016 1634   BILITOT 0.9 03/21/2016 1634   GFRNONAA >89 03/21/2016 1634   GFRAA >89 03/21/2016 1634    Imaging: No results found.  Speciality Comments: No specialty comments available.    Procedures:  No procedures performed Allergies: Patient has no known allergies.   Assessment / Plan:     Visit Diagnoses: Myofascial pain syndrome: She states she has intermittent flares. Currently she is doing well. During her flare she has increased pain in her neck thoracic and lower back. She also has lower extremity muscle spasm and discomfort. She lives in chronic pain. She states that the muscle relaxers methocarbamol and Flexeril she takes on when necessary basis which are helpful.  Other chronic pain:  She takes medications on when necessary basis.  Other fatigue: Episodic with myofascial pain flare.  Primary osteoarthritis of both knees: She's been having increased knee joint discomfort lately. A handout on knee joint exercises was given.  History of vitamin D deficiency: I have encouraged the use of vitamin D.  History of migraine    Orders: No orders of the defined types were placed in this encounter.  No orders of the defined types were placed in this encounter.   Follow-Up Instructions: Return in about 6 months (around 05/07/2017) for MFPS,, Osteoarthritis.   Pollyann Savoy, MD  Note - This record has been created using [employer].  Chart creation errors have been sought, but may not always  have been located. Such creation errors do not reflect on  the standard of medical care.

## 2016-11-05 DIAGNOSIS — M170 Bilateral primary osteoarthritis of knee: Secondary | ICD-10-CM | POA: Insufficient documentation

## 2016-11-06 ENCOUNTER — Ambulatory Visit (INDEPENDENT_AMBULATORY_CARE_PROVIDER_SITE_OTHER): Payer: Self-pay | Admitting: Rheumatology

## 2016-11-06 ENCOUNTER — Encounter: Payer: Self-pay | Admitting: Rheumatology

## 2016-11-06 VITALS — BP 107/72 | HR 100 | Ht 63.0 in | Wt 140.0 lb

## 2016-11-06 DIAGNOSIS — G8929 Other chronic pain: Secondary | ICD-10-CM

## 2016-11-06 DIAGNOSIS — M7918 Myalgia, other site: Secondary | ICD-10-CM

## 2016-11-06 DIAGNOSIS — R5383 Other fatigue: Secondary | ICD-10-CM

## 2016-11-06 DIAGNOSIS — Z8639 Personal history of other endocrine, nutritional and metabolic disease: Secondary | ICD-10-CM

## 2016-11-06 DIAGNOSIS — M170 Bilateral primary osteoarthritis of knee: Secondary | ICD-10-CM

## 2016-11-06 DIAGNOSIS — Z8669 Personal history of other diseases of the nervous system and sense organs: Secondary | ICD-10-CM

## 2016-11-06 NOTE — Patient Instructions (Signed)

## 2016-11-22 ENCOUNTER — Ambulatory Visit (HOSPITAL_COMMUNITY)
Admission: EM | Admit: 2016-11-22 | Discharge: 2016-11-22 | Disposition: A | Discharge status: Home / Self Care | Payer: BLUE CROSS/BLUE SHIELD | Attending: Emergency Medicine | Admitting: Emergency Medicine

## 2016-11-22 ENCOUNTER — Encounter (HOSPITAL_COMMUNITY): Payer: Self-pay | Admitting: Emergency Medicine

## 2016-11-22 DIAGNOSIS — S29019A Strain of muscle and tendon of unspecified wall of thorax, initial encounter: Secondary | ICD-10-CM

## 2016-11-22 DIAGNOSIS — G44319 Acute post-traumatic headache, not intractable: Secondary | ICD-10-CM

## 2016-11-22 DIAGNOSIS — R0789 Other chest pain: Secondary | ICD-10-CM

## 2016-11-22 DIAGNOSIS — S39012A Strain of muscle, fascia and tendon of lower back, initial encounter: Secondary | ICD-10-CM

## 2016-11-22 DIAGNOSIS — S46819A Strain of other muscles, fascia and tendons at shoulder and upper arm level, unspecified arm, initial encounter: Secondary | ICD-10-CM

## 2016-11-22 MED ORDER — DICLOFENAC SODIUM 75 MG PO TBEC: DELAYED_RELEASE_TABLET | ORAL | 0 refills | Status: DC

## 2016-11-22 NOTE — Discharge Instructions (Signed)
Apply heat to the muscles that are sore and achy. Apply ice to the chest where it is sore. Take your cyclobenzaprine as needed. Take the diclofenac medicine as to record with food to help with pain and inflammation. Perform gentle stretches as we discussed. Follow up with your primary care doctor next week as needed
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
## 2017-03-20 DIAGNOSIS — Z3490 Encounter for supervision of normal pregnancy, unspecified, unspecified trimester: Secondary | ICD-10-CM | POA: Insufficient documentation

## 2017-03-24 DIAGNOSIS — D649 Anemia, unspecified: Secondary | ICD-10-CM | POA: Insufficient documentation

## 2017-03-26 LAB — OB RESULTS CONSOLE HEPATITIS B SURFACE ANTIGEN: HEP B S AG: NEGATIVE

## 2017-04-05 DIAGNOSIS — B009 Herpesviral infection, unspecified: Secondary | ICD-10-CM | POA: Insufficient documentation

## 2017-04-30 NOTE — Progress Notes (Signed)
Office Visit Note  Patient: Marcia Bowers             Date of Birth: 09-02-1980           MRN: [id number]             PCP: Lucila Maine Referring: Lucila Maine Visit Date: 05/13/2017 Occupation: @GUAROCC @    Subjective:  Pain in both knees.   History of Present Illness: Marcia Bowers is a 37 y.o. female with history of myofascial pain syndrome and osteoarthritis.  She states that she has been having a lot of pain and discomfort in her bilateral knee joints.  She is having difficulty climbing stairs and going downstairs.  She is at 20 weeks of gestation currently.  She is not having any generalized pain.  She has had no myofascial pain flare.  Activities of Daily Living:  Patient reports morning stiffness for 0 minute.   Patient Denies nocturnal pain.  Difficulty dressing/grooming: Denies Difficulty climbing stairs: Reports Difficulty getting out of chair: Reports Difficulty using hands for taps, buttons, cutlery, and/or writing: Denies   Review of Systems  Constitutional: Positive for fatigue. Negative for night sweats, weight gain and weight loss.  HENT: Negative for mouth sores, trouble swallowing, trouble swallowing, mouth dryness and nose dryness.   Eyes: Negative for pain, redness, visual disturbance and dryness.  Respiratory: Negative for cough, shortness of breath and difficulty breathing.   Cardiovascular: Negative for chest pain, palpitations, hypertension, irregular heartbeat and swelling in legs/feet.  Gastrointestinal: Negative for blood in stool, constipation and diarrhea.  Endocrine: Negative for increased urination.  Genitourinary: Negative for vaginal dryness.  Musculoskeletal: Positive for arthralgias and joint pain. Negative for joint swelling, myalgias, muscle weakness, morning stiffness, muscle tenderness and myalgias.  Skin: Negative for color change, rash, hair loss, skin tightness, ulcers and sensitivity to sunlight.    Allergic/Immunologic: Negative for susceptible to infections.  Neurological: Negative for dizziness, memory loss, night sweats and weakness.  Hematological: Negative for swollen glands.  Psychiatric/Behavioral: Positive for sleep disturbance. Negative for depressed mood. The patient is not nervous/anxious.     PMFS History:  Patient Active Problem List   Diagnosis Date Noted  . Primary osteoarthritis of both knees 11/05/2016  . Myofascial pain syndrome 03/19/2016  . Other chronic pain 03/19/2016  . Other fatigue 03/19/2016  . Myalgia 03/19/2016  . Pain of both hip joints 03/19/2016  . Arthritis, senescent 03/19/2016  . Arthralgia of both knees 03/19/2016  . History of vitamin D deficiency 03/19/2016  . BV (bacterial vaginosis)   . Yeast infection   . Trichomonas   . Chlamydia   . Gonorrhea   . Fibroids   . Umbilical hernia 01/24/2011  . UNSPECIFIED VITAMIN D DEFICIENCY 06/03/2008  . ABNORMAL PAP SMEAR, LGSIL 08/21/2007  . BACK PAIN, LUMBAR 07/30/2007  . BACK PAIN, UPPER 07/30/2007  . MIGRAINE HEADACHE 07/02/2007  . DENTAL CARIES 07/02/2007  . LEG PAIN, CHRONIC 07/02/2007    Past Medical History:  Diagnosis Date  . Abnormal Pap smear   . Anemia   . BV (bacterial vaginosis)   . Chlamydia   . Fibroids   . Fibromyalgia   . Gonorrhea   . History of chicken pox   . Trichomonas   . Yeast infection     Family History  Problem Relation Age of Onset  . Thyroid disease Mother   . Hypertension Father   . Heart disease Other  Heart on the right  . Heart disease Paternal Grandfather   . Hypertension Paternal Grandfather   . Diabetes Paternal Grandfather   . Heart disease Paternal Grandmother   . Hypertension Paternal Grandmother   . Healthy Sister   . Healthy Brother   . Anesthesia problems Neg Hx    Past Surgical History:  Procedure Laterality Date  . CESAREAN SECTION  04/13/2011   Procedure: CESAREAN SECTION;  Surgeon: Purcell Nails, MD;  Location: [facility];  Service: Gynecology;  Laterality: N/A;  Primary cesarean section with delivery of baby girl at 78. Apgars 8/9.  Marland Kitchen WISDOM TOOTH EXTRACTION     Social History   Social History Narrative  . Not on file     Objective: Vital Signs: BP 100/62 (BP Location: Left Arm, Patient Position: Sitting, Cuff Size: Normal)   Pulse 94   Resp 14   Ht 5\' 3"  (1.6 m)   Wt 136 lb (61.7 kg)   LMP 11/14/2016   BMI 24.09 kg/m    Physical Exam  Constitutional: She is oriented to person, place, and time. She appears well-developed and well-nourished.  HENT:  Head: Normocephalic and atraumatic.  Eyes: Conjunctivae and EOM are normal.  Neck: Normal range of motion.  Cardiovascular: Normal rate, regular rhythm, normal heart sounds and intact distal pulses.  Pulmonary/Chest: Effort normal and breath sounds normal.  Abdominal: Soft. Bowel sounds are normal.  [redacted] weeks gestation  Lymphadenopathy:    She has no cervical adenopathy.  Neurological: She is alert and oriented to person, place, and time.  Skin: Skin is warm and dry. Capillary refill takes less than 2 seconds.  Psychiatric: She has a normal mood and affect. Her behavior is normal.  Nursing note and vitals reviewed.    Musculoskeletal Exam: C-spine thoracic lumbar spine with range of motion.  Shoulder joints elbow joints wrist joint MCPs PIPs to episode good range of motion with no synovitis.  She has good range of motion of the hip joints ligaments ankles MTPs PIPs with no synovitis.  She has no crepitus in her knee joints.  He did not have any tender points.  CDAI Exam: No CDAI exam completed.    Investigation: No additional findings. CBC Latest Ref Rng & Units 03/21/2016 04/16/2011 04/14/2011  WBC 3.8 - 10.8 K/uL 8.2 21.2(H) 24.5(H)  Hemoglobin 11.7 - 15.5 g/dL 11.6(L) 7.8(L) 8.4(L)  Hematocrit 35.0 - 45.0 % 36.7 23.7(L) 26.1(L)  Platelets 140 - 400 K/uL 348 217 158   CMP Latest Ref Rng & Units 03/21/2016 03/16/2009 07/30/2007  Glucose 65  - 99 mg/dL 81 409(W) 85  BUN 7 - 25 mg/dL 10 6 10   Creatinine 0.50 - 1.10 mg/dL 1.19 0.8 1.47  Sodium 829 - 146 mmol/L 138 139 140  Potassium 3.5 - 5.3 mmol/L 4.0 3.9 5.0  Chloride 98 - 110 mmol/L 104 106 104  CO2 20 - 31 mmol/L 27 - 23  Calcium 8.6 - 10.2 mg/dL 9.1 - 56.2  Total Protein 6.1 - 8.1 g/dL 6.9 - 7.6  Total Bilirubin 0.2 - 1.2 mg/dL 0.9 - 1.2  Alkaline Phos 33 - 115 U/L 39 - 68  AST 10 - 30 U/L 18 - 19  ALT 6 - 29 U/L 12 - 12    Imaging: No results found.  Speciality Comments: No specialty comments available.    Procedures:  No procedures performed Allergies: Patient has no known allergies.   Assessment / Plan:     Visit Diagnoses: Chronic pain of  both knees-she has been having increased pain in her knee joints.  Notes she is having difficulty with taking the stairs and also bending her knees.  I reviewed her x-rays in the previous chart moderate osteoarthritis in bilateral knee joints.  I believe some of the joint pain is coming from weight gain during pregnancy.  As she is pregnant I would not prescribe any medications.  Knee joint muscle strengthening exercises were discussed and a handout was given.  Primary osteoarthritis of both knees  Myofascial pain syndrome-she is not having a flare currently she is doing fairly well.  [redacted] weeks gestation of pregnancy-doing well.  Other fatigue-she continues to have some fatigue.  Other chronic pain  History of migraine  History of vitamin D deficiency    Orders: No orders of the defined types were placed in this encounter.  No orders of the defined types were placed in this encounter.   Follow-Up Instructions: Return in about 6 months (around 11/12/2017) for Osteoarthritis.   Pollyann Savoy, MD  Note - This record has been created using Animal nutritionist.  Chart creation errors have been sought, but may not always  have been located. Such creation errors do not reflect on  the standard of medical care.

## 2017-05-07 ENCOUNTER — Other Ambulatory Visit (HOSPITAL_COMMUNITY): Payer: Self-pay | Admitting: Obstetrics and Gynecology

## 2017-05-07 DIAGNOSIS — Z3689 Encounter for other specified antenatal screening: Secondary | ICD-10-CM

## 2017-05-13 ENCOUNTER — Encounter: Payer: Self-pay | Admitting: Rheumatology

## 2017-05-13 ENCOUNTER — Ambulatory Visit: Payer: Medicaid Other | Admitting: Rheumatology

## 2017-05-13 VITALS — BP 100/62 | HR 94 | Resp 14 | Ht 63.0 in | Wt 136.0 lb

## 2017-05-13 DIAGNOSIS — M7918 Myalgia, other site: Secondary | ICD-10-CM

## 2017-05-13 DIAGNOSIS — M25562 Pain in left knee: Secondary | ICD-10-CM | POA: Diagnosis not present

## 2017-05-13 DIAGNOSIS — Z8639 Personal history of other endocrine, nutritional and metabolic disease: Secondary | ICD-10-CM | POA: Diagnosis not present

## 2017-05-13 DIAGNOSIS — M25561 Pain in right knee: Secondary | ICD-10-CM | POA: Diagnosis not present

## 2017-05-13 DIAGNOSIS — Z8669 Personal history of other diseases of the nervous system and sense organs: Secondary | ICD-10-CM | POA: Diagnosis not present

## 2017-05-13 DIAGNOSIS — G8929 Other chronic pain: Secondary | ICD-10-CM

## 2017-05-13 DIAGNOSIS — M170 Bilateral primary osteoarthritis of knee: Secondary | ICD-10-CM | POA: Diagnosis not present

## 2017-05-13 DIAGNOSIS — Z3A20 20 weeks gestation of pregnancy: Secondary | ICD-10-CM | POA: Diagnosis not present

## 2017-05-13 DIAGNOSIS — R5383 Other fatigue: Secondary | ICD-10-CM

## 2017-05-13 NOTE — Patient Instructions (Signed)

## 2017-05-16 ENCOUNTER — Encounter (HOSPITAL_COMMUNITY): Payer: Self-pay | Admitting: *Deleted

## 2017-05-19 ENCOUNTER — Ambulatory Visit (INDEPENDENT_AMBULATORY_CARE_PROVIDER_SITE_OTHER): Payer: Medicaid Other | Admitting: Orthopaedic Surgery

## 2017-05-20 ENCOUNTER — Ambulatory Visit (HOSPITAL_COMMUNITY)
Admission: RE | Admit: 2017-05-20 | Discharge: 2017-05-20 | Disposition: A | Discharge status: Home / Self Care | Payer: Medicaid Other | Source: Ambulatory Visit | Attending: Obstetrics and Gynecology | Admitting: Obstetrics and Gynecology

## 2017-05-20 ENCOUNTER — Encounter (HOSPITAL_COMMUNITY): Payer: Self-pay

## 2017-05-20 ENCOUNTER — Other Ambulatory Visit (HOSPITAL_COMMUNITY): Payer: Self-pay | Admitting: Obstetrics and Gynecology

## 2017-05-20 DIAGNOSIS — Z3A19 19 weeks gestation of pregnancy: Secondary | ICD-10-CM | POA: Insufficient documentation

## 2017-05-20 DIAGNOSIS — O09522 Supervision of elderly multigravida, second trimester: Secondary | ICD-10-CM | POA: Diagnosis present

## 2017-05-20 DIAGNOSIS — O3412 Maternal care for benign tumor of corpus uteri, second trimester: Secondary | ICD-10-CM

## 2017-05-20 DIAGNOSIS — Z363 Encounter for antenatal screening for malformations: Secondary | ICD-10-CM | POA: Diagnosis not present

## 2017-05-20 DIAGNOSIS — Z3689 Encounter for other specified antenatal screening: Secondary | ICD-10-CM

## 2017-05-20 DIAGNOSIS — D259 Leiomyoma of uterus, unspecified: Secondary | ICD-10-CM | POA: Insufficient documentation

## 2017-05-20 DIAGNOSIS — O34219 Maternal care for unspecified type scar from previous cesarean delivery: Secondary | ICD-10-CM | POA: Insufficient documentation

## 2017-05-20 HISTORY — DX: Unspecified abnormal cytological findings in specimens from vagina: R87.629

## 2017-05-20 NOTE — Addendum Note (Signed)
Encounter addended by: Hessie Dibble, RDMS on: 05/20/2017 4:08 PM  Actions taken: Imaging Exam ended

## 2017-06-02 ENCOUNTER — Encounter (HOSPITAL_COMMUNITY): Payer: Self-pay | Admitting: Emergency Medicine

## 2017-06-02 ENCOUNTER — Ambulatory Visit (HOSPITAL_COMMUNITY)
Admission: EM | Admit: 2017-06-02 | Discharge: 2017-06-02 | Disposition: A | Discharge status: Home / Self Care | Payer: Medicaid Other | Attending: Family Medicine | Admitting: Family Medicine

## 2017-06-02 DIAGNOSIS — M797 Fibromyalgia: Secondary | ICD-10-CM | POA: Insufficient documentation

## 2017-06-02 DIAGNOSIS — O3412 Maternal care for benign tumor of corpus uteri, second trimester: Secondary | ICD-10-CM | POA: Diagnosis not present

## 2017-06-02 DIAGNOSIS — D259 Leiomyoma of uterus, unspecified: Secondary | ICD-10-CM | POA: Diagnosis not present

## 2017-06-02 DIAGNOSIS — E559 Vitamin D deficiency, unspecified: Secondary | ICD-10-CM | POA: Diagnosis not present

## 2017-06-02 DIAGNOSIS — R52 Pain, unspecified: Secondary | ICD-10-CM | POA: Insufficient documentation

## 2017-06-02 DIAGNOSIS — Z833 Family history of diabetes mellitus: Secondary | ICD-10-CM | POA: Diagnosis not present

## 2017-06-02 DIAGNOSIS — J029 Acute pharyngitis, unspecified: Secondary | ICD-10-CM | POA: Diagnosis not present

## 2017-06-02 DIAGNOSIS — O99512 Diseases of the respiratory system complicating pregnancy, second trimester: Secondary | ICD-10-CM | POA: Insufficient documentation

## 2017-06-02 DIAGNOSIS — O99282 Endocrine, nutritional and metabolic diseases complicating pregnancy, second trimester: Secondary | ICD-10-CM | POA: Insufficient documentation

## 2017-06-02 DIAGNOSIS — Z79891 Long term (current) use of opiate analgesic: Secondary | ICD-10-CM | POA: Diagnosis not present

## 2017-06-02 DIAGNOSIS — M791 Myalgia, unspecified site: Secondary | ICD-10-CM | POA: Diagnosis not present

## 2017-06-02 DIAGNOSIS — O99352 Diseases of the nervous system complicating pregnancy, second trimester: Secondary | ICD-10-CM | POA: Insufficient documentation

## 2017-06-02 DIAGNOSIS — Z791 Long term (current) use of non-steroidal anti-inflammatories (NSAID): Secondary | ICD-10-CM | POA: Insufficient documentation

## 2017-06-02 DIAGNOSIS — O34219 Maternal care for unspecified type scar from previous cesarean delivery: Secondary | ICD-10-CM | POA: Insufficient documentation

## 2017-06-02 DIAGNOSIS — Z79899 Other long term (current) drug therapy: Secondary | ICD-10-CM | POA: Insufficient documentation

## 2017-06-02 DIAGNOSIS — Z8249 Family history of ischemic heart disease and other diseases of the circulatory system: Secondary | ICD-10-CM | POA: Insufficient documentation

## 2017-06-02 DIAGNOSIS — Z79890 Hormone replacement therapy (postmenopausal): Secondary | ICD-10-CM | POA: Insufficient documentation

## 2017-06-02 DIAGNOSIS — M170 Bilateral primary osteoarthritis of knee: Secondary | ICD-10-CM | POA: Diagnosis not present

## 2017-06-02 DIAGNOSIS — Z3A21 21 weeks gestation of pregnancy: Secondary | ICD-10-CM | POA: Insufficient documentation

## 2017-06-02 LAB — OB RESULTS CONSOLE GBS: GBS: NEGATIVE

## 2017-06-02 LAB — POCT URINALYSIS DIP (DEVICE)
Bilirubin Urine: NEGATIVE
GLUCOSE, UA: NEGATIVE mg/dL
HGB URINE DIPSTICK: NEGATIVE
Leukocytes, UA: NEGATIVE
Nitrite: NEGATIVE
PROTEIN: NEGATIVE mg/dL
UROBILINOGEN UA: 2 mg/dL — AB (ref 0.0–1.0)
pH: 6 (ref 5.0–8.0)

## 2017-06-02 LAB — POCT RAPID STREP A: Streptococcus, Group A Screen (Direct): NEGATIVE

## 2017-06-02 MED ORDER — ACETAMINOPHEN 325 MG PO TABS
ORAL_TABLET | ORAL | Status: AC
  Filled 2017-06-02: qty 3

## 2017-06-02 MED ORDER — ACETAMINOPHEN 325 MG PO TABS
975.0000 mg | ORAL_TABLET | Freq: Once | ORAL | Status: AC
  Administered 2017-06-02: 975 mg via ORAL

## 2017-06-02 NOTE — ED Provider Notes (Signed)
[place]    CSN: [id number] Arrival date & time: 06/02/17  1816     History   Chief Complaint Chief Complaint  Patient presents with  . Generalized Body Aches    HPI Marcia Bowers is a 37 y.o. female.   Marcia Bowers presents with complaints of sore throat, body aches, runny nose, facial pressure, headache, which started today. Sore throat feels the worst of symptoms. Hx of chronic pain so she feels her sore throat is exacerbating this. No known ill contacts. Did attend her daughters dance recital this weekend and was around a lot of kids. Has not taken any medications for symptoms. Has been taking in less intake due to sore throat today. [redacted] weeks pregnant, normal baby movements, normal urination, denies vaginal bleeding. Taking prenatal, stool softener, vitamin d and iron supplementation, no other current medications.   ROS per HPI.      Past Medical History:  Diagnosis Date  . Abnormal Pap smear   . Anemia   . BV (bacterial vaginosis)   . Chlamydia   . Fibroids   . Fibromyalgia   . Gonorrhea   . History of chicken pox   . Trichomonas   . Vaginal Pap smear, abnormal   . Yeast infection     Patient Active Problem List   Diagnosis Date Noted  . Primary osteoarthritis of both knees 11/05/2016  . Myofascial pain syndrome 03/19/2016  . Other chronic pain 03/19/2016  . Other fatigue 03/19/2016  . Myalgia 03/19/2016  . Pain of both hip joints 03/19/2016  . Arthritis, senescent 03/19/2016  . Arthralgia of both knees 03/19/2016  . History of vitamin D deficiency 03/19/2016  . BV (bacterial vaginosis)   . Yeast infection   . Trichomonas   . Chlamydia   . Gonorrhea   . Fibroids   . Umbilical hernia 96/29/5284  . UNSPECIFIED VITAMIN D DEFICIENCY 06/03/2008  . ABNORMAL PAP SMEAR, LGSIL 08/21/2007  . BACK PAIN, LUMBAR 07/30/2007  . BACK PAIN, UPPER 07/30/2007  . MIGRAINE HEADACHE 07/02/2007  . DENTAL CARIES 07/02/2007  . LEG PAIN, CHRONIC 07/02/2007     Past Surgical History:  Procedure Laterality Date  . CESAREAN SECTION  04/13/2011   Procedure: CESAREAN SECTION;  Surgeon: Delice Lesch, MD;  Location: [facility];  Service: Gynecology;  Laterality: N/A;  Primary cesarean section with delivery of baby girl at 17. Apgars 8/9.  Marland Kitchen WISDOM TOOTH EXTRACTION      OB History    Gravida  2   Para  1   Term  1   Preterm      AB      Living  1     SAB      TAB      Ectopic      Multiple      Live Births  1            Home Medications    Prior to Admission medications   Medication Sig Start Date End Date Taking? Authorizing Provider  cholecalciferol (VITAMIN D) 1000 UNITS tablet Take 1,000 Units by mouth every morning.    [provider]  cyclobenzaprine (FLEXERIL) 10 MG tablet TAKE ONE TABLET BY MOUTH ONCE DAILY AT BEDTIME Patient not taking: Reported on 05/13/2017 01/26/16   Bo Merino, MD  CYCLOBENZAPRINE HCL ER PO Take by mouth.    [provider]  diclofenac (VOLTAREN) 75 MG EC tablet One BID prn pain. Take with food Patient not taking: Reported on  05/13/2017 11/22/16   Janne Napoleon, NP  docusate sodium (COLACE) 100 MG capsule Take 100 mg by mouth 2 (two) times daily as needed. For constipation    [provider]  FENUGREEK PO Take by mouth.    [provider]  ferrous sulfate 325 (65 FE) MG tablet Take 325 mg by mouth daily with breakfast.    [provider]  HYDROcodone-acetaminophen (NORCO/VICODIN) 5-325 MG per tablet Take 2 tablets by mouth every 6 (six) hours as needed for moderate pain. Patient not taking: Reported on 05/13/2017 06/27/13   Bjorn Pippin, PA-C  HYDROCODONE-ACETAMINOPHEN PO Take by mouth.    [provider]  ibuprofen (ADVIL,MOTRIN) 600 MG tablet Take 600 mg by mouth every 6 (six) hours as needed.    [provider]  medroxyPROGESTERone (DEPO-PROVERA) 150 MG/ML injection Inject 150 mg into the muscle every 3 (three) months.  04/13/11   [provider]  Multiple Vitamin (THERA) TABS Take by mouth.    [provider]  Omega-3 Fatty Acids (FISH OIL) 1000 MG CAPS Take by mouth.    [provider]  Prenatal Vit-Fe Fumarate-FA (PRENATAL MULTIVITAMIN) TABS Take 1 tablet by mouth every morning.    [provider]  Calcium Carbonate-Vitamin D (CALCIUM-VITAMIN D) 500-200 MG-UNIT per tablet Take 1 tablet by mouth 2 (two) times daily with a meal.   04/12/11  [provider]  polysaccharide iron complex (NIFEREX) 100 MG/5ML elixir Take by mouth daily.   04/12/11  [provider]    Family History Family History  Problem Relation Age of Onset  . Thyroid disease Mother   . Hypertension Father   . Heart disease Other        Heart on the right  . Heart disease Paternal Grandfather   . Hypertension Paternal Grandfather   . Diabetes Paternal Grandfather   . Heart disease Paternal Grandmother   . Hypertension Paternal Grandmother   . Healthy Sister   . Healthy Brother   . Anesthesia problems Neg Hx     Social History Social History   Tobacco Use  . Smoking status: Never Smoker  . Smokeless tobacco: Never Used  Substance Use Topics  . Alcohol use: Not Currently  . Drug use: No     Allergies   Patient has no known allergies.   Review of Systems Review of Systems   Physical Exam Triage Vital Signs ED Triage Vitals [06/02/17 1845]  Enc Vitals Group     BP 107/67     Pulse Rate (!) 105     Resp 18     Temp 99.1 F (37.3 C)     Temp src      SpO2 100 %     Weight      Height      Head Circumference      Peak Flow      Pain Score      Pain Loc      Pain Edu?      Excl. in [place]?    No data found.  Updated Vital Signs BP 107/67   Pulse (!) 105   Temp 99.1 F (37.3 C)   Resp 18   LMP 01/06/2017   SpO2 100%    Physical Exam  Constitutional: She is oriented to person, place, and time. She appears well-developed and well-nourished. No distress.   HENT:  Head: Normocephalic and atraumatic.  Right Ear: Tympanic membrane, external ear and ear canal normal.  Left Ear: Tympanic  membrane, external ear and ear canal normal.  Nose: Mucosal edema and rhinorrhea present. Right sinus exhibits no maxillary sinus tenderness and no frontal sinus tenderness. Left sinus exhibits no maxillary sinus tenderness and no frontal sinus tenderness.  Mouth/Throat: Uvula is midline, oropharynx is clear and moist and mucous membranes are normal. Tonsils are 1+ on the right. Tonsils are 1+ on the left. No tonsillar exudate.  Eyes: Pupils are equal, round, and reactive to light. Conjunctivae and EOM are normal.  Cardiovascular: Regular rhythm and normal heart sounds. Tachycardia present.  Pulmonary/Chest: Effort normal and breath sounds normal.  Lymphadenopathy:    She has no cervical adenopathy.  Neurological: She is alert and oriented to person, place, and time.  Skin: Skin is warm and dry.     UC Treatments / Results  Labs (all labs ordered are listed, but only abnormal results are displayed) Labs Reviewed  POCT URINALYSIS DIP (DEVICE) - Abnormal; Notable for the following components:      Result Value   Ketones, ur TRACE (*)    Urobilinogen, UA 2.0 (*)    All other components within normal limits  CULTURE, GROUP A STREP [facility])  POCT RAPID STREP A    EKG None  Radiology No results found.  Procedures Procedures (including critical care time)  Medications Ordered in UC Medications - No data to display  Initial Impression / Assessment and Plan / UC Course  I have reviewed the triage vital signs and the nursing notes.  Pertinent labs & imaging results that were available during my care of the patient were reviewed by me and considered in my medical decision making (see chart for details).     Mild tachycardia, temp 99.1. Patient declines tylenol in clinic today. Without redflag findings on exam, non toxic in appearance. History and  physical consistent with viral illness.  Supportive cares recommended. Safe medications during pregnancy list provided. Tylenol as needed for pain. Return precautions provided. Patient verbalized understanding and agreeable to plan.    Final Clinical Impressions(s) / UC Diagnoses   Final diagnoses:  Viral pharyngitis     Discharge Instructions     Your strep swab is negative today, I have sent it also for culture and we would call you if this were to return positive. Tylenol as needed for pain or fevers.  Increase fluid intake. Please see provided list of medications safe for use during pregnancy. If symptoms worsen or do not improve in the next week to return to be seen or to follow up with your PCP.      ED Prescriptions    None     Controlled Substance Prescriptions [place] Controlled Substance Registry consulted? Not Applicable   Zigmund Gottron, NP 06/02/17 5329

## 2017-06-02 NOTE — Discharge Instructions (Signed)
Your strep swab is negative today, I have sent it also for culture and we would call you if this were to return positive. Tylenol as needed for pain or fevers.  Increase fluid intake. Please see provided list of medications safe for use during pregnancy. If symptoms worsen or do not improve in the next week to return to be seen or to follow up with your PCP.

## 2017-06-02 NOTE — ED Triage Notes (Addendum)
Pt c/o body aches, back pain, sore throat, headache, eye pain, ear pain. Pt is [redacted] weeks pregnant.

## 2017-06-05 LAB — CULTURE, GROUP A STREP (THRC)

## 2017-07-03 ENCOUNTER — Encounter (HOSPITAL_COMMUNITY): Payer: Self-pay

## 2017-07-08 DIAGNOSIS — O2610 Low weight gain in pregnancy, unspecified trimester: Secondary | ICD-10-CM | POA: Insufficient documentation

## 2017-07-08 LAB — OB RESULTS CONSOLE HIV ANTIBODY (ROUTINE TESTING): HIV: NONREACTIVE

## 2017-07-10 DIAGNOSIS — B009 Herpesviral infection, unspecified: Secondary | ICD-10-CM | POA: Insufficient documentation

## 2017-07-10 DIAGNOSIS — D72823 Leukemoid reaction: Secondary | ICD-10-CM | POA: Insufficient documentation

## 2017-09-18 ENCOUNTER — Encounter (HOSPITAL_COMMUNITY): Payer: Self-pay

## 2017-09-18 ENCOUNTER — Inpatient Hospital Stay (HOSPITAL_COMMUNITY)
Admission: AD | Admit: 2017-09-18 | Discharge: 2017-09-22 | DRG: 785 | Disposition: A | Discharge status: Home / Self Care | Payer: Medicaid Other | Attending: Obstetrics & Gynecology | Admitting: Obstetrics & Gynecology

## 2017-09-18 ENCOUNTER — Inpatient Hospital Stay (HOSPITAL_COMMUNITY): Payer: Medicaid Other | Admitting: Anesthesiology

## 2017-09-18 DIAGNOSIS — O34211 Maternal care for low transverse scar from previous cesarean delivery: Principal | ICD-10-CM | POA: Diagnosis present

## 2017-09-18 DIAGNOSIS — Z302 Encounter for sterilization: Secondary | ICD-10-CM

## 2017-09-18 DIAGNOSIS — O42013 Preterm premature rupture of membranes, onset of labor within 24 hours of rupture, third trimester: Secondary | ICD-10-CM

## 2017-09-18 DIAGNOSIS — O324XX0 Maternal care for high head at term, not applicable or unspecified: Secondary | ICD-10-CM | POA: Diagnosis present

## 2017-09-18 DIAGNOSIS — Z3A36 36 weeks gestation of pregnancy: Secondary | ICD-10-CM

## 2017-09-18 DIAGNOSIS — O4290 Premature rupture of membranes, unspecified as to length of time between rupture and onset of labor, unspecified weeks of gestation: Secondary | ICD-10-CM | POA: Diagnosis present

## 2017-09-18 DIAGNOSIS — O42913 Preterm premature rupture of membranes, unspecified as to length of time between rupture and onset of labor, third trimester: Secondary | ICD-10-CM | POA: Diagnosis present

## 2017-09-18 LAB — TYPE AND SCREEN
ABO/RH(D): O POS
Antibody Screen: NEGATIVE

## 2017-09-18 LAB — CBC
HEMATOCRIT: 34.8 % — AB (ref 36.0–46.0)
HEMOGLOBIN: 10.9 g/dL — AB (ref 12.0–15.0)
MCH: 29.5 pg (ref 26.0–34.0)
MCHC: 31.3 g/dL (ref 30.0–36.0)
MCV: 94.1 fL (ref 78.0–100.0)
Platelets: 265 10*3/uL (ref 150–400)
RBC: 3.7 MIL/uL — AB (ref 3.87–5.11)
RDW: 13.4 % (ref 11.5–15.5)
WBC: 13.3 10*3/uL — ABNORMAL HIGH (ref 4.0–10.5)

## 2017-09-18 LAB — ABO/RH: ABO/RH(D): O POS

## 2017-09-18 LAB — GROUP B STREP BY PCR: GROUP B STREP BY PCR: NEGATIVE

## 2017-09-18 MED ORDER — EPHEDRINE 5 MG/ML INJ: 10.0000 mg | INTRAVENOUS | Status: DC | PRN

## 2017-09-18 MED ORDER — PHENYLEPHRINE 40 MCG/ML (10ML) SYRINGE FOR IV PUSH (FOR BLOOD PRESSURE SUPPORT)
80.0000 ug | PREFILLED_SYRINGE | INTRAVENOUS | Status: DC | PRN
  Administered 2017-09-18: 80 ug via INTRAVENOUS

## 2017-09-18 MED ORDER — FENTANYL CITRATE (PF) 100 MCG/2ML IJ SOLN
50.0000 ug | INTRAMUSCULAR | Status: DC | PRN
  Administered 2017-09-18: 100 ug via INTRAVENOUS
  Filled 2017-09-18: qty 2

## 2017-09-18 MED ORDER — LACTATED RINGERS IV SOLN: 500.0000 mL | INTRAVENOUS | Status: DC | PRN

## 2017-09-18 MED ORDER — BETAMETHASONE SOD PHOS & ACET 6 (3-3) MG/ML IJ SUSP
12.0000 mg | Freq: Once | INTRAMUSCULAR | Status: AC
  Administered 2017-09-18: 12 mg via INTRAMUSCULAR
  Filled 2017-09-18: qty 2

## 2017-09-18 MED ORDER — OXYTOCIN 40 UNITS IN LACTATED RINGERS INFUSION - SIMPLE MED
1.0000 m[IU]/min | INTRAVENOUS | Status: DC
  Administered 2017-09-18: 2 m[IU]/min via INTRAVENOUS
  Filled 2017-09-18 (×2): qty 1000

## 2017-09-18 MED ORDER — FENTANYL 2.5 MCG/ML BUPIVACAINE 1/10 % EPIDURAL INFUSION (WH - ANES)
INTRAMUSCULAR | Status: AC
  Filled 2017-09-18: qty 100

## 2017-09-18 MED ORDER — OXYCODONE-ACETAMINOPHEN 5-325 MG PO TABS: 2.0000 | ORAL_TABLET | ORAL | Status: DC | PRN

## 2017-09-18 MED ORDER — SOD CITRATE-CITRIC ACID 500-334 MG/5ML PO SOLN
30.0000 mL | ORAL | Status: DC | PRN
  Filled 2017-09-18: qty 15

## 2017-09-18 MED ORDER — PHENYLEPHRINE 40 MCG/ML (10ML) SYRINGE FOR IV PUSH (FOR BLOOD PRESSURE SUPPORT)
PREFILLED_SYRINGE | INTRAVENOUS | Status: AC
  Administered 2017-09-18: 80 ug via INTRAVENOUS
  Filled 2017-09-18: qty 10

## 2017-09-18 MED ORDER — TERBUTALINE SULFATE 1 MG/ML IJ SOLN: 0.2500 mg | Freq: Once | INTRAMUSCULAR | Status: DC | PRN

## 2017-09-18 MED ORDER — OXYCODONE-ACETAMINOPHEN 5-325 MG PO TABS: 1.0000 | ORAL_TABLET | ORAL | Status: DC | PRN

## 2017-09-18 MED ORDER — PHENYLEPHRINE 40 MCG/ML (10ML) SYRINGE FOR IV PUSH (FOR BLOOD PRESSURE SUPPORT): 80.0000 ug | PREFILLED_SYRINGE | INTRAVENOUS | Status: DC | PRN

## 2017-09-18 MED ORDER — DIPHENHYDRAMINE HCL 50 MG/ML IJ SOLN: 12.5000 mg | INTRAMUSCULAR | Status: DC | PRN

## 2017-09-18 MED ORDER — OXYTOCIN BOLUS FROM INFUSION: 500.0000 mL | Freq: Once | INTRAVENOUS | Status: DC

## 2017-09-18 MED ORDER — LACTATED RINGERS IV SOLN: 500.0000 mL | Freq: Once | INTRAVENOUS | Status: DC

## 2017-09-18 MED ORDER — FENTANYL 2.5 MCG/ML BUPIVACAINE 1/10 % EPIDURAL INFUSION (WH - ANES)
14.0000 mL/h | INTRAMUSCULAR | Status: DC | PRN
  Administered 2017-09-18: 14 mL/h via EPIDURAL

## 2017-09-18 MED ORDER — ACETAMINOPHEN 325 MG PO TABS
650.0000 mg | ORAL_TABLET | ORAL | Status: DC | PRN
  Administered 2017-09-19: 650 mg via ORAL
  Filled 2017-09-18: qty 2

## 2017-09-18 MED ORDER — ONDANSETRON HCL 4 MG/2ML IJ SOLN: 4.0000 mg | Freq: Four times a day (QID) | INTRAMUSCULAR | Status: DC | PRN

## 2017-09-18 MED ORDER — LIDOCAINE HCL (PF) 1 % IJ SOLN
INTRAMUSCULAR | Status: DC | PRN
  Administered 2017-09-18 (×2): 5 mL via EPIDURAL

## 2017-09-18 MED ORDER — LIDOCAINE HCL (PF) 1 % IJ SOLN
30.0000 mL | INTRAMUSCULAR | Status: DC | PRN
  Filled 2017-09-18: qty 30

## 2017-09-18 MED ORDER — OXYTOCIN 40 UNITS IN LACTATED RINGERS INFUSION - SIMPLE MED: 2.5000 [IU]/h | INTRAVENOUS | Status: DC

## 2017-09-18 MED ORDER — LACTATED RINGERS IV SOLN
INTRAVENOUS | Status: DC
  Administered 2017-09-18: 125 mL/h via INTRAVENOUS
  Administered 2017-09-18 (×2): via INTRAVENOUS

## 2017-09-18 NOTE — Progress Notes (Addendum)
Marcia Bowers is a 37 y.o. G2P1001 at [redacted]w[redacted]d admitted for PROM.  Subjective: Patient comfortable with epidural, feeling pressure/urge to push during contractions only.   Objective: Vitals:   09/18/17 2015 09/18/17 2020 09/18/17 2025 09/18/17 2030  BP: (!) 91/53 (!) 95/54 (!) 94/53 (!) 93/55  Pulse: 91 92 90 93  Resp:      Temp:      TempSrc:      SpO2:      Weight:      Height:       FHT:  FHR: 135 bpm, variability: moderate,  accelerations:  Present,  decelerations:  Absent UC:   regular, every 2 minutes SVE:   Dilation: 8 Effacement (%): 100 Station: 0 Exam by:: Ranee Gosselin CNM  Labs: Lab Results  Component Value Date   WBC 13.3 (H) 09/18/2017   HGB 10.9 (L) 09/18/2017   HCT 34.8 (L) 09/18/2017   MCV 94.1 09/18/2017   PLT 265 09/18/2017    Assessment / Plan: Induction of labor due to PROM,  progressing well on pitocin  Labor: Progressing normally Preeclampsia:  no signs or symptoms of toxicity, intake and ouput balanced and labs stable Fetal Wellbeing:  Category I Pain Control:  Epidural I/D:  n/a Anticipated MOD:  NSVD  Marikay Alar 09/18/2017, 9:22 PM

## 2017-09-18 NOTE — Anesthesia Procedure Notes (Signed)
Epidural Patient location during procedure: OB  Staffing Anesthesiologist: Jie Stickels, MD Performed: anesthesiologist   Preanesthetic Checklist Completed: patient identified, site marked, surgical consent, pre-op evaluation, timeout performed, IV checked, risks and benefits discussed and monitors and equipment checked  Epidural Patient position: sitting Prep: DuraPrep Patient monitoring: heart rate, continuous pulse ox and blood pressure Approach: right paramedian Location: L3-L4 Injection technique: LOR saline  Needle:  Needle type: Tuohy  Needle gauge: 17 G Needle length: 9 cm and 9 Needle insertion depth: 6 cm Catheter type: closed end flexible Catheter size: 20 Guage Catheter at skin depth: 10 cm Test dose: negative  Assessment Events: blood not aspirated, injection not painful, no injection resistance, negative IV test and no paresthesia  Additional Notes Patient identified. Risks/Benefits/Options discussed with patient including but not limited to bleeding, infection, nerve damage, paralysis, failed block, incomplete pain control, headache, blood pressure changes, nausea, vomiting, reactions to medication both or allergic, itching and postpartum back pain. Confirmed with bedside nurse the patient's most recent platelet count. Confirmed with patient that they are not currently taking any anticoagulation, have any bleeding history or any family history of bleeding disorders. Patient expressed understanding and wished to proceed. All questions were answered. Sterile technique was used throughout the entire procedure. Please see nursing notes for vital signs. Test dose was given through epidural needle and negative prior to continuing to dose epidural or start infusion. Warning signs of high block given to the patient including shortness of breath, tingling/numbness in hands, complete motor block, or any concerning symptoms with instructions to call for help. Patient was given  instructions on fall risk and not to get out of bed. All questions and concerns addressed with instructions to call with any issues.     

## 2017-09-18 NOTE — MAU Note (Signed)
Pt reports ? Leaking fluid since 0430

## 2017-09-18 NOTE — Progress Notes (Signed)
Subjective: Pt is not feeling contractions.  Still comfortable.  Objective: BP 112/68   Pulse 90   Temp 98.2 F (36.8 C)   Resp 16   Ht 5\' 3"  (1.6 m)   Wt 65.3 kg   LMP 01/06/2017   SpO2 100%   BMI 25.51 kg/m  No intake/output data recorded. No intake/output data recorded.  FHT: Category 1 FHT 140 accels, no decels UC:   irregular, every 10 minutes SVE:   Dilation: 3 Exam by:: Irene Shipper, CNM Pitocin at will start augmentation.  Discussed risk and benefits with patient.   Assessment:  G2P1001 at 36.3 IUP with prom x 12 hours Previous LTCS desires tolac Cat 1 strip  Plan: Pitocin augmentation  Marcia Bowers CNM, MSN 09/18/2017, 4:54 PM

## 2017-09-18 NOTE — Progress Notes (Signed)
Spoke to [employer], CNM, discussed POC, will re-evaluate at 1600.

## 2017-09-18 NOTE — Anesthesia Pain Management Evaluation Note (Signed)
  CRNA Pain Management Visit Note  Patient: Marcia Bowers, 37 y.o., female  "Hello I am a member of the anesthesia team at [facility]. We have an anesthesia team available at all times to provide care throughout the hospital, including epidural management and anesthesia for C-section. I don't know your plan for the delivery whether it a natural birth, water birth, IV sedation, nitrous supplementation, doula or epidural, but we want to meet your pain goals."   1.Was your pain managed to your expectations on prior hospitalizations?   Yes   2.What is your expectation for pain management during this hospitalization?     Labor support without medications - possibly an epidural.  3.How can we help you reach that goal? The patient states that she needed a C-Section with her last pregnancy because " the baby would not come down and out."  She reports that she thinks that this baby is bigger than her first child buts still wants to try a vaginal birth.  Record the patient's initial score and the patient's pain goal.   Pain: 0  Pain Goal: 7 The [facility] wants you to be able to say your pain was always managed very well.  Josede Cicero 09/18/2017

## 2017-09-18 NOTE — MAU Note (Signed)
Urine in lab 

## 2017-09-18 NOTE — Anesthesia Preprocedure Evaluation (Signed)

## 2017-09-18 NOTE — H&P (Addendum)
Marcia Bowers is a 37 y.o. female, G2P1001 at 36.3 weeks, presenting for SROM at 0430 clear fluid.  Prenatal hx remarkable for prior hx of LTCS desires tolac.  Risk review and documented in chart. Hx of fibromyalgia and AMA and leukocytosis.  Saw hematology with follow up planned after delivery.  Patient Active Problem List   Diagnosis Date Noted  . Primary osteoarthritis of both knees 11/05/2016  . Myofascial pain syndrome 03/19/2016  . Other chronic pain 03/19/2016  . Other fatigue 03/19/2016  . Myalgia 03/19/2016  . Pain of both hip joints 03/19/2016  . Arthritis, senescent 03/19/2016  . Arthralgia of both knees 03/19/2016  . History of vitamin D deficiency 03/19/2016  . BV (bacterial vaginosis)   . Yeast infection   . Trichomonas   . Chlamydia   . Gonorrhea   . Fibroids   . Umbilical hernia 75/10/2583  . UNSPECIFIED VITAMIN D DEFICIENCY 06/03/2008  . ABNORMAL PAP SMEAR, LGSIL 08/21/2007  . BACK PAIN, LUMBAR 07/30/2007  . BACK PAIN, UPPER 07/30/2007  . MIGRAINE HEADACHE 07/02/2007  . DENTAL CARIES 07/02/2007  . LEG PAIN, CHRONIC 07/02/2007    History of present pregnancy: Patient entered care at 10.3 weeks.   EDC of 10/13/2017 was established by LMP.   Anatomy scan:  20 weeks, with normal findings and an posterior placenta.   Additional [place] evaluations:  Growth at 26 weeks 81%  Significant prenatal events:   Last evaluation: yesterday  OB History    Gravida  2   Para  1   Term  1   Preterm      AB      Living  1     SAB      TAB      Ectopic      Multiple      Live Births  1          Past Medical History:  Diagnosis Date  . Abnormal Pap smear   . Anemia   . BV (bacterial vaginosis)   . Chlamydia   . Fibroids   . Fibromyalgia   . Gonorrhea   . History of chicken pox   . Trichomonas   . Vaginal Pap smear, abnormal   . Yeast infection    Past Surgical History:  Procedure Laterality Date  . CESAREAN SECTION  04/13/2011   Procedure:  CESAREAN SECTION;  Surgeon: Delice Lesch, MD;  Location: [facility];  Service: Gynecology;  Laterality: N/A;  Primary cesarean section with delivery of baby girl at 13. Apgars 8/9.  Marland Kitchen WISDOM TOOTH EXTRACTION     Family History: family history includes Diabetes in her paternal grandfather; Healthy in her brother and sister; Heart disease in her other, paternal grandfather, and paternal grandmother; Hypertension in her father, paternal grandfather, and paternal grandmother; Thyroid disease in her mother. Social History:  reports that she has never smoked. She has never used smokeless tobacco. She reports that she drank alcohol. She reports that she does not use drugs.   Prenatal Transfer Tool  Maternal Diabetes: No Genetic Screening: Normal Maternal Ultrasounds/Referrals: Normal Fetal Ultrasounds or other Referrals:  None Maternal Substance Abuse:  No Significant Maternal Medications:  None Significant Maternal Lab Results: Lab values include: Other:  High WBC   ROS:  Reviewed and negative except as stated above.  No Known Allergies     Blood pressure 117/68, pulse (!) 102, temperature 98.4 F (36.9 C), temperature source Oral, resp. rate 18, height 5\' 3"  (1.6 m),  weight 65.3 kg, last menstrual period 01/06/2017, SpO2 100 %, currently breastfeeding.  Chest clear Heart RRR without murmur Abd gravid, NT, FH appropriate Pelvic: deferred Ext: negative  FHR: Category 1 FHT 130s accels no decels UCs:  irregular  Prenatal labs: ABO, Rh:  O pos Antibody:  Neg Rubella:   Immune RPR:   NR HBsAg:   Neg HIV:   NR GBS:   Sickle cell/Hgb electrophoresis:  AA Pap:  2018 wnl GC: Neg Chlamydia:  Neg Genetic screenings:  Neg Glucola:  80 Other:   Hgb 10.3 at NOB,10.1  at 28 weeks       Assessment/Plan: IUP at 36.3 IUP with SROM clear fluid at 0430 Previous LTSC , desires tolac Cat 1 strip  Plan: Admit to Birthing Suite Routine [facility] orders Pain med/epidural prn GBS sent  PCR will treat if positive for GBS Pt desires no augmentation at this point  Pleas Koch ProtheroCNM, MSN 09/18/2017, 9:47 AM

## 2017-09-19 ENCOUNTER — Encounter (HOSPITAL_COMMUNITY): Payer: Self-pay

## 2017-09-19 ENCOUNTER — Encounter (HOSPITAL_COMMUNITY)
Admission: AD | Disposition: A | Discharge status: Home / Self Care | Payer: Self-pay | Source: Home / Self Care | Attending: Obstetrics & Gynecology

## 2017-09-19 ENCOUNTER — Other Ambulatory Visit: Payer: Self-pay

## 2017-09-19 HISTORY — PX: TUBAL LIGATION: SHX77

## 2017-09-19 LAB — CBC
HCT: 32.3 % — ABNORMAL LOW (ref 36.0–46.0)
Hemoglobin: 10.3 g/dL — ABNORMAL LOW (ref 12.0–15.0)
MCH: 29.9 pg (ref 26.0–34.0)
MCHC: 31.9 g/dL (ref 30.0–36.0)
MCV: 93.9 fL (ref 78.0–100.0)
Platelets: 240 10*3/uL (ref 150–400)
RBC: 3.44 MIL/uL — ABNORMAL LOW (ref 3.87–5.11)
RDW: 13.5 % (ref 11.5–15.5)
WBC: 26.4 10*3/uL — ABNORMAL HIGH (ref 4.0–10.5)

## 2017-09-19 LAB — RPR: RPR: NONREACTIVE

## 2017-09-19 SURGERY — Surgical Case
Anesthesia: Epidural | Wound class: Clean Contaminated

## 2017-09-19 MED ORDER — LIDOCAINE-EPINEPHRINE (PF) 2 %-1:200000 IJ SOLN
INTRAMUSCULAR | Status: AC
  Filled 2017-09-19: qty 20

## 2017-09-19 MED ORDER — LACTATED RINGERS IV SOLN
INTRAVENOUS | Status: DC | PRN
  Administered 2017-09-19 (×3): via INTRAVENOUS

## 2017-09-19 MED ORDER — OXYTOCIN 10 UNIT/ML IJ SOLN
INTRAMUSCULAR | Status: AC
  Filled 2017-09-19: qty 4

## 2017-09-19 MED ORDER — ONDANSETRON HCL 4 MG/2ML IJ SOLN
INTRAMUSCULAR | Status: AC
  Filled 2017-09-19: qty 4

## 2017-09-19 MED ORDER — BUPIVACAINE HCL (PF) 0.5 % IJ SOLN
INTRAMUSCULAR | Status: DC | PRN
  Administered 2017-09-19: 30 mL

## 2017-09-19 MED ORDER — SODIUM CHLORIDE 0.9 % IR SOLN
Status: DC | PRN
  Administered 2017-09-19: 1

## 2017-09-19 MED ORDER — SIMETHICONE 80 MG PO CHEW
80.0000 mg | CHEWABLE_TABLET | Freq: Three times a day (TID) | ORAL | Status: DC
  Administered 2017-09-19 – 2017-09-22 (×8): 80 mg via ORAL
  Filled 2017-09-19 (×9): qty 1

## 2017-09-19 MED ORDER — MEPERIDINE HCL 25 MG/ML IJ SOLN
INTRAMUSCULAR | Status: DC | PRN
  Administered 2017-09-19 (×2): 12.5 mg via INTRAVENOUS

## 2017-09-19 MED ORDER — MORPHINE SULFATE (PF) 0.5 MG/ML IJ SOLN
INTRAMUSCULAR | Status: DC | PRN
  Administered 2017-09-19: 3 mg via EPIDURAL
  Administered 2017-09-19: 2 mg via INTRAVENOUS

## 2017-09-19 MED ORDER — SIMETHICONE 80 MG PO CHEW
80.0000 mg | CHEWABLE_TABLET | ORAL | Status: DC
  Administered 2017-09-19 – 2017-09-21 (×3): 80 mg via ORAL
  Filled 2017-09-19 (×3): qty 1

## 2017-09-19 MED ORDER — BUPIVACAINE HCL (PF) 0.5 % IJ SOLN
INTRAMUSCULAR | Status: AC
  Filled 2017-09-19: qty 30

## 2017-09-19 MED ORDER — DIBUCAINE 1 % RE OINT: 1.0000 "application " | TOPICAL_OINTMENT | RECTAL | Status: DC | PRN

## 2017-09-19 MED ORDER — SCOPOLAMINE 1 MG/3DAYS TD PT72: 1.0000 | MEDICATED_PATCH | Freq: Once | TRANSDERMAL | Status: DC

## 2017-09-19 MED ORDER — SCOPOLAMINE 1 MG/3DAYS TD PT72
MEDICATED_PATCH | TRANSDERMAL | Status: DC | PRN
  Administered 2017-09-19: 1 via TRANSDERMAL

## 2017-09-19 MED ORDER — NALBUPHINE HCL 10 MG/ML IJ SOLN: 5.0000 mg | INTRAMUSCULAR | Status: DC | PRN

## 2017-09-19 MED ORDER — OXYTOCIN 10 UNIT/ML IJ SOLN
INTRAVENOUS | Status: DC | PRN
  Administered 2017-09-19: 40 [IU] via INTRAVENOUS

## 2017-09-19 MED ORDER — TETANUS-DIPHTH-ACELL PERTUSSIS 5-2.5-18.5 LF-MCG/0.5 IM SUSP: 0.5000 mL | Freq: Once | INTRAMUSCULAR | Status: DC

## 2017-09-19 MED ORDER — ONDANSETRON HCL 4 MG/2ML IJ SOLN
INTRAMUSCULAR | Status: DC | PRN
  Administered 2017-09-19: 4 mg via INTRAVENOUS

## 2017-09-19 MED ORDER — FENTANYL CITRATE (PF) 100 MCG/2ML IJ SOLN
INTRAMUSCULAR | Status: AC
  Filled 2017-09-19: qty 2

## 2017-09-19 MED ORDER — SCOPOLAMINE 1 MG/3DAYS TD PT72
MEDICATED_PATCH | TRANSDERMAL | Status: AC
  Filled 2017-09-19: qty 1

## 2017-09-19 MED ORDER — OXYTOCIN 40 UNITS IN LACTATED RINGERS INFUSION - SIMPLE MED: 2.5000 [IU]/h | INTRAVENOUS | Status: AC

## 2017-09-19 MED ORDER — DIPHENHYDRAMINE HCL 50 MG/ML IJ SOLN: 12.5000 mg | INTRAMUSCULAR | Status: DC | PRN

## 2017-09-19 MED ORDER — OXYCODONE-ACETAMINOPHEN 5-325 MG PO TABS
1.0000 | ORAL_TABLET | ORAL | Status: DC | PRN
  Administered 2017-09-21 (×2): 1 via ORAL
  Filled 2017-09-19 (×2): qty 1

## 2017-09-19 MED ORDER — METOCLOPRAMIDE HCL 5 MG/ML IJ SOLN: 10.0000 mg | Freq: Once | INTRAMUSCULAR | Status: DC | PRN

## 2017-09-19 MED ORDER — LACTATED RINGERS IV SOLN: INTRAVENOUS | Status: DC

## 2017-09-19 MED ORDER — SODIUM CHLORIDE 0.9% FLUSH: 3.0000 mL | INTRAVENOUS | Status: DC | PRN

## 2017-09-19 MED ORDER — KETOROLAC TROMETHAMINE 30 MG/ML IJ SOLN: 30.0000 mg | Freq: Four times a day (QID) | INTRAMUSCULAR | Status: AC | PRN

## 2017-09-19 MED ORDER — DIPHENHYDRAMINE HCL 25 MG PO CAPS
25.0000 mg | ORAL_CAPSULE | ORAL | Status: DC | PRN
  Filled 2017-09-19: qty 1

## 2017-09-19 MED ORDER — SODIUM BICARBONATE 8.4 % IV SOLN
INTRAVENOUS | Status: AC
  Filled 2017-09-19: qty 50

## 2017-09-19 MED ORDER — KETOROLAC TROMETHAMINE 30 MG/ML IJ SOLN
INTRAMUSCULAR | Status: AC
  Administered 2017-09-19: 30 mg via INTRAMUSCULAR
  Filled 2017-09-19: qty 1

## 2017-09-19 MED ORDER — COCONUT OIL OIL
1.0000 "application " | TOPICAL_OIL | Status: DC | PRN
  Filled 2017-09-19: qty 120

## 2017-09-19 MED ORDER — DEXAMETHASONE SODIUM PHOSPHATE 4 MG/ML IJ SOLN
INTRAMUSCULAR | Status: DC | PRN
  Administered 2017-09-19: 4 mg via INTRAVENOUS

## 2017-09-19 MED ORDER — FENTANYL CITRATE (PF) 100 MCG/2ML IJ SOLN
INTRAMUSCULAR | Status: DC | PRN
  Administered 2017-09-19 (×2): 50 ug via INTRAVENOUS
  Administered 2017-09-19: 100 ug via EPIDURAL

## 2017-09-19 MED ORDER — MORPHINE SULFATE (PF) 0.5 MG/ML IJ SOLN
INTRAMUSCULAR | Status: AC
  Filled 2017-09-19: qty 10

## 2017-09-19 MED ORDER — IBUPROFEN 600 MG PO TABS
600.0000 mg | ORAL_TABLET | Freq: Four times a day (QID) | ORAL | Status: DC
  Administered 2017-09-19 – 2017-09-22 (×13): 600 mg via ORAL
  Filled 2017-09-19 (×13): qty 1

## 2017-09-19 MED ORDER — SOD CITRATE-CITRIC ACID 500-334 MG/5ML PO SOLN
30.0000 mL | ORAL | Status: AC
  Administered 2017-09-19: 30 mL via ORAL

## 2017-09-19 MED ORDER — FENTANYL CITRATE (PF) 100 MCG/2ML IJ SOLN: 25.0000 ug | INTRAMUSCULAR | Status: DC | PRN

## 2017-09-19 MED ORDER — NALBUPHINE HCL 10 MG/ML IJ SOLN
5.0000 mg | INTRAMUSCULAR | Status: DC | PRN
  Administered 2017-09-19: 5 mg via INTRAVENOUS
  Filled 2017-09-19: qty 1

## 2017-09-19 MED ORDER — ACETAMINOPHEN 325 MG PO TABS: 650.0000 mg | ORAL_TABLET | ORAL | Status: DC | PRN

## 2017-09-19 MED ORDER — ZOLPIDEM TARTRATE 5 MG PO TABS: 5.0000 mg | ORAL_TABLET | Freq: Every evening | ORAL | Status: DC | PRN

## 2017-09-19 MED ORDER — ONDANSETRON HCL 4 MG/2ML IJ SOLN: 4.0000 mg | Freq: Three times a day (TID) | INTRAMUSCULAR | Status: DC | PRN

## 2017-09-19 MED ORDER — WITCH HAZEL-GLYCERIN EX PADS: 1.0000 "application " | MEDICATED_PAD | CUTANEOUS | Status: DC | PRN

## 2017-09-19 MED ORDER — MEPERIDINE HCL 25 MG/ML IJ SOLN
INTRAMUSCULAR | Status: AC
  Filled 2017-09-19: qty 1

## 2017-09-19 MED ORDER — NALBUPHINE HCL 10 MG/ML IJ SOLN
5.0000 mg | Freq: Once | INTRAMUSCULAR | Status: AC | PRN
  Administered 2017-09-19: 5 mg via INTRAVENOUS
  Filled 2017-09-19: qty 1

## 2017-09-19 MED ORDER — DIPHENHYDRAMINE HCL 25 MG PO CAPS
25.0000 mg | ORAL_CAPSULE | Freq: Four times a day (QID) | ORAL | Status: DC | PRN
  Administered 2017-09-19 – 2017-09-20 (×3): 25 mg via ORAL
  Filled 2017-09-19 (×4): qty 1

## 2017-09-19 MED ORDER — ACETAMINOPHEN 500 MG PO TABS
1000.0000 mg | ORAL_TABLET | Freq: Four times a day (QID) | ORAL | Status: AC
  Administered 2017-09-19 – 2017-09-20 (×4): 1000 mg via ORAL
  Filled 2017-09-19 (×4): qty 2

## 2017-09-19 MED ORDER — SIMETHICONE 80 MG PO CHEW: 80.0000 mg | CHEWABLE_TABLET | ORAL | Status: DC | PRN

## 2017-09-19 MED ORDER — NALOXONE HCL 4 MG/10ML IJ SOLN
1.0000 ug/kg/h | INTRAVENOUS | Status: DC | PRN
  Filled 2017-09-19: qty 5

## 2017-09-19 MED ORDER — SODIUM BICARBONATE 8.4 % IV SOLN
INTRAVENOUS | Status: DC | PRN
  Administered 2017-09-19 (×2): 5 mL via EPIDURAL
  Administered 2017-09-19: 4 mL via EPIDURAL
  Administered 2017-09-19: 3 mL via EPIDURAL

## 2017-09-19 MED ORDER — DEXAMETHASONE SODIUM PHOSPHATE 4 MG/ML IJ SOLN
INTRAMUSCULAR | Status: AC
  Filled 2017-09-19: qty 1

## 2017-09-19 MED ORDER — NALBUPHINE HCL 10 MG/ML IJ SOLN: 5.0000 mg | Freq: Once | INTRAMUSCULAR | Status: AC | PRN

## 2017-09-19 MED ORDER — NALOXONE HCL 0.4 MG/ML IJ SOLN: 0.4000 mg | INTRAMUSCULAR | Status: DC | PRN

## 2017-09-19 MED ORDER — MEPERIDINE HCL 25 MG/ML IJ SOLN: 6.2500 mg | INTRAMUSCULAR | Status: DC | PRN

## 2017-09-19 MED ORDER — SENNOSIDES-DOCUSATE SODIUM 8.6-50 MG PO TABS
2.0000 | ORAL_TABLET | ORAL | Status: DC
  Administered 2017-09-19 – 2017-09-21 (×3): 2 via ORAL
  Filled 2017-09-19 (×3): qty 2

## 2017-09-19 MED ORDER — PRENATAL MULTIVITAMIN CH
1.0000 | ORAL_TABLET | Freq: Every day | ORAL | Status: DC
  Administered 2017-09-19 – 2017-09-22 (×4): 1 via ORAL
  Filled 2017-09-19 (×4): qty 1

## 2017-09-19 MED ORDER — KETOROLAC TROMETHAMINE 30 MG/ML IJ SOLN
30.0000 mg | Freq: Four times a day (QID) | INTRAMUSCULAR | Status: AC | PRN
  Administered 2017-09-19: 30 mg via INTRAMUSCULAR

## 2017-09-19 MED ORDER — MENTHOL 3 MG MT LOZG: 1.0000 | LOZENGE | OROMUCOSAL | Status: DC | PRN

## 2017-09-19 MED ORDER — LACTATED RINGERS IV SOLN
INTRAVENOUS | Status: DC
  Administered 2017-09-19: 13:00:00 via INTRAVENOUS

## 2017-09-19 MED ORDER — OXYCODONE-ACETAMINOPHEN 5-325 MG PO TABS
2.0000 | ORAL_TABLET | ORAL | Status: DC | PRN
  Administered 2017-09-21 – 2017-09-22 (×3): 2 via ORAL
  Filled 2017-09-19 (×3): qty 2

## 2017-09-19 MED ORDER — SODIUM CHLORIDE 0.9 % IV SOLN
3.0000 g | Freq: Once | INTRAVENOUS | Status: AC
  Administered 2017-09-19: 3 g via INTRAVENOUS
  Filled 2017-09-19: qty 3

## 2017-09-19 SURGICAL SUPPLY — 42 items
APL SKNCLS STERI-STRIP NONHPOA (GAUZE/BANDAGES/DRESSINGS) ×2
BARRIER ADHS 3X4 INTERCEED (GAUZE/BANDAGES/DRESSINGS) ×2 IMPLANT
BENZOIN TINCTURE PRP APPL 2/3 (GAUZE/BANDAGES/DRESSINGS) ×4 IMPLANT
BRR ADH 4X3 ABS CNTRL BYND (GAUZE/BANDAGES/DRESSINGS) ×2
CHLORAPREP W/TINT 26ML (MISCELLANEOUS) ×4 IMPLANT
CLAMP CORD UMBIL (MISCELLANEOUS) IMPLANT
CLIP FILSHIE TUBAL LIGA STRL (Clip) ×4 IMPLANT
CLOSURE WOUND 1/2 X4 (GAUZE/BANDAGES/DRESSINGS) ×1
CLOTH BEACON ORANGE TIMEOUT ST (SAFETY) ×4 IMPLANT
DRSG OPSITE POSTOP 4X10 (GAUZE/BANDAGES/DRESSINGS) ×4 IMPLANT
ELECT REM PT RETURN 9FT ADLT (ELECTROSURGICAL) ×4
ELECTRODE REM PT RTRN 9FT ADLT (ELECTROSURGICAL) ×2 IMPLANT
EXTRACTOR VACUUM BELL STYLE (SUCTIONS) IMPLANT
EXTRACTOR VACUUM KIWI (MISCELLANEOUS) ×2 IMPLANT
GLOVE BIO SURGEON STRL SZ 6.5 (GLOVE) ×3 IMPLANT
GLOVE BIO SURGEONS STRL SZ 6.5 (GLOVE) ×1
GLOVE BIOGEL PI IND STRL 7.0 (GLOVE) ×4 IMPLANT
GLOVE BIOGEL PI INDICATOR 7.0 (GLOVE) ×4
GOWN STRL REUS W/TWL LRG LVL3 (GOWN DISPOSABLE) ×12 IMPLANT
KIT ABG SYR 3ML LUER SLIP (SYRINGE) ×2 IMPLANT
NDL HYPO 25X5/8 SAFETYGLIDE (NEEDLE) IMPLANT
NEEDLE HYPO 22GX1.5 SAFETY (NEEDLE) IMPLANT
NEEDLE HYPO 25X5/8 SAFETYGLIDE (NEEDLE) IMPLANT
NS IRRIG 1000ML POUR BTL (IV SOLUTION) ×4 IMPLANT
PACK C SECTION WH (CUSTOM PROCEDURE TRAY) ×4 IMPLANT
PAD ABD 7.5X8 STRL (GAUZE/BANDAGES/DRESSINGS) ×2 IMPLANT
PAD OB MATERNITY 4.3X12.25 (PERSONAL CARE ITEMS) ×4 IMPLANT
PENCIL SMOKE EVAC W/HOLSTER (ELECTROSURGICAL) ×4 IMPLANT
RTRCTR C-SECT PINK 25CM LRG (MISCELLANEOUS) ×4 IMPLANT
STRIP CLOSURE SKIN 1/2X4 (GAUZE/BANDAGES/DRESSINGS) ×3 IMPLANT
SUT CHROMIC 2 0 CT 1 (SUTURE) ×8 IMPLANT
SUT MNCRL 0 VIOLET CTX 36 (SUTURE) ×4 IMPLANT
SUT MONOCRYL 0 CTX 36 (SUTURE) ×4
SUT PDS AB 0 CTX 36 PDP370T (SUTURE) IMPLANT
SUT PLAIN 2 0 (SUTURE)
SUT PLAIN ABS 2-0 CT1 27XMFL (SUTURE) IMPLANT
SUT VIC AB 0 CTX 36 (SUTURE) ×8
SUT VIC AB 0 CTX36XBRD ANBCTRL (SUTURE) ×4 IMPLANT
SUT VIC AB 4-0 KS 27 (SUTURE) ×4 IMPLANT
SYR CONTROL 10ML LL (SYRINGE) ×2 IMPLANT
TOWEL OR 17X24 6PK STRL BLUE (TOWEL DISPOSABLE) ×4 IMPLANT
TRAY FOLEY W/BAG SLVR 14FR LF (SET/KITS/TRAYS/PACK) ×2 IMPLANT

## 2017-09-19 NOTE — Transfer of Care (Signed)
Immediate Anesthesia Transfer of Care Note  Patient: Marcia Bowers  Procedure(s) Performed: CESAREAN SECTION (N/A )  Patient Location: PACU  Anesthesia Type:Epidural  Level of Consciousness: awake, alert  and oriented  Airway & Oxygen Therapy: Patient Spontanous Breathing  Post-op Assessment: Report given to RN and Post -op Vital signs reviewed and stable  Post vital signs: Reviewed and stable HR 100, RR 18, SaO2 100%, BP 102/85  Last Vitals:  Vitals Value Taken Time  BP    Temp    Pulse 118 09/19/2017  3:02 AM  Resp 11 09/19/2017  3:02 AM  SpO2 100 % 09/19/2017  3:02 AM  Vitals shown include unvalidated device data.  Last Pain:  Vitals:   09/19/17 0001  TempSrc: Axillary  PainSc:          Complications: No apparent anesthesia complications

## 2017-09-19 NOTE — Progress Notes (Signed)
MD Progress Note / Pre op note  Marcia Bowers is a 37 y.o. G2P1001 at [redacted]w[redacted]d by LMP admitted for PPROM TOLAC in early active labor.  Patient progressed to complete / complete 0 station, however she has pushed for 3 hours without descent of the biparietal diameter and there is suture overlap and molding.  Patient is exhausted and now desires a repeat cesarean delivery.  Her most recent temperature was 100.7.    Objective: BP (!) 97/57   Pulse 77   Temp (!) 100.7 F (38.2 C) (Axillary)   Resp 20   Ht 5\' 3"  (1.6 m)   Wt 65.3 kg   LMP 01/06/2017   SpO2 100%   BMI 25.51 kg/m  No intake/output data recorded. Total I/O In: 1059.5 [I.V.:1059.5] Out: 600 [Urine:600]  FHT:  FHR: 160 bpm, variability: minimal ,  accelerations:  Abscent,  decelerations:  Present deep variable decels with pushing nadir 90's with return to baseline after 45 seconds UC:   regular, every 2-3 minutes SVE:   Dilation: 10 Effacement (%): 100 Station: 0 station to Plus one  Exam by:W. Padme Arriaga  Labs: Lab Results  Component Value Date   WBC 13.3 (H) 09/18/2017   HGB 10.9 (L) 09/18/2017   HCT 34.8 (L) 09/18/2017   MCV 94.1 09/18/2017   PLT 265 09/18/2017    Assessment / Plan: Arrest of decent  Patient to go to Operating room for repeat low transverse cesarean section Discussed with patient risks of surgery to include hemorrhage, infection, injury to other  Organs, vessels. Possible c-hysterectomy. Patient voiced understanding and agrees to proceed.  Unasyn 3GM x 1 dose for intramniotic infection and surgical prophylaxis.  Will continue Unasyn post operatively.    Rieley Hausman, [place] 09/19/2017, 12:56 AM

## 2017-09-19 NOTE — Op Note (Signed)
Cesarean Section Procedure Note  Indications: Patient with history of previous uterine incision kerr x 1 admitted to Labor and Delivery for PPROM with arrest of descent at 10 cm.    Pre-operative Diagnosis: 1. Arrest of Descent 2. Failed TOLAC 3. Desired permanent sterilization   Post-operative Diagnosis: same  Procedure performed:  Repeat cesarean delivery / bilateral tubal ligation  Surgeon: Caffie Damme, MD   Assistants: Ranee Gosselin, CNM  Anesthesia:  Epidural anesthesia  ASA Class: 2  Procedure Details   The patient was counseled about the risks, benefits, complications of the cesarean section. The patient concurred with the proposed plan, giving informed consent.  The site of surgery properly noted/marked. The patient was taken to Operating Room # 9, identified as Marcia Bowers and the procedure verified as C-Section Delivery. A Time Out was held and the above information confirmed.  After epidural was found to adequate , the patient was placed in the dorsal supine position with a leftward tilt, draped and prepped in the usual sterile manner. A Pfannenstiel incision was made and carried down through the subcutaneous tissue to the fascia.  The fascia was incised in the midline and the fascial incision was extended laterally with Mayo scissors. The superior aspect of the fascial incision was grasped with Coker clamps x2, tented up and the rectus muscles dissected off sharply with the scalpel. The rectus was then dissected off with blunt dissection and Mayo scissors inferiorly. The rectus muscles were separated in the midline. The abdominal peritoneum was identified, tented up between two hemostats, entered sharply using Metzenbaum scissors.  The bladder was noted to be scarred to the anterior uterine wall and the scar tissue had to be dissected off carefully.   Once the bladder urothelium was reflected the bladder flap was created and a Balfour bladder blade was placed  posteriorly to protect the bladder.  With the assistant holding the [facility] retractor anteriorly the lower uterine segment was cut with a scalpel carefully until entry into the uterus.  The transverse incision was opened bluntly using surgeons hands.  as then used to make a low transverse incision on the uterus which was extended laterally with  blunt dissection. The fetal vertex was locked in the pelvis and a nurse had to push the head upward from the vagina.  Once the head was brought out from the pelvis it was delivered through the incision with the assistant giving fundal pressure.  A live female infant was bulb suctioned on the operative field the cord was clamped and cut and the infant was passed to the waiting neonatologist. Apgars 4/9.    Cord gases (venous) and cord blood was obtained. The placenta was then delivered spontaneously, intact and appeared small. The uterus was cleared of all clot and debris. The uterine incision was repaired with #0 Monocryl in running locked fashion. A second imbricating suture was performed using the same suture. At the time the incision appeared hemostatic. A moist lap was placed over the incision and attention was moved to the sterilization portion of the procedure. The uterine fundus was identified and the ovaries and tubes were inspected and normal. The left fallopian tube was held with a  Babcock clamp and was traced down until the fimbriated end was visualized.  A Filshie clip was applied to the mid-infundibular portion.  1 mL of 0.25% plain Marcaine was drizzled over the tube. The same was done on the contralateral side.   The uterine incision was inspected again and there  appeared to be oozing from the middle portion of the incision.  A figure of eight suture of 0-monocryl was placed and the incision was hemostatic.  Irrigation using sterile normal saline was performed and hemostasis again was assured.   Interceed was placed over the incision. The abdominal  peritoneum was reapproximated with 2-0 chromic  in a running fashion, the rectus muscles was reapproximated with #2 chromic in interrupted fashion. The fascia was closed with 0 PDS in a running fashion from both ends meeting in the middle. The subcuticular layer was irrigated and all bleeders cauterized.    The incision was injected with 25 mL of 0.5% Marcaine. The skin was closed with 4-0 vicryl in a subcuticular fashion using a Lanny Hurst needle. The incision was dressed with benzoine, steri strips and pressure dressing. All sponge lap and needle counts were correct x3. Patient tolerated the procedure well and recovered in stable condition following the procedure.  Instrument, sponge, and needle counts were correct prior the abdominal closure and at the conclusion of the case.   Findings: Live female infant, Apgars 4/9, clear amniotic fluid, small appearing placenta, normal uterus, bilateral tubes and ovaries  Estimated Blood Loss: 600 mL  IVF: 2471mL         Drains: Foley catheter  Urine output: 100 mL clear urine         Specimens: Placenta to Pathology         Implants: none         Complications:  None; patient tolerated the procedure well.         Disposition: PACU - hemodynamically stable.   Kaleel Schmieder STACIA

## 2017-09-19 NOTE — Lactation Note (Signed)
This note was copied from a baby's chart. Lactation Consultation Note  Patient Name: Marcia Bowers [id number] Date: 09/19/2017 Reason for consult: Initial assessment;Late-preterm 34-36.6wks   P2 mother whose infant is now 59 hours old.  Mother had been advised to stop breastfeeding her 38 year old (due to possibility of premature labor) while pregnant with this child.  Baby in father's arms as I arrived.  Mother plans to breastfeed but baby last fed 25 mls of Gerber [employer] at [employer].  Mother was not familiar with the LPTI policy so I reviewed in detail with her and father of baby.  Explained the importance of waking her every 3 hours if she does not self awaken.  Reviewed tummy size and minimum feeding amounts baby must take as supplementation.  Encouraged mother to always attempt breastfeeding first and limit total feeding time to 30 minutes.    Initiated DEBP to help increase mother's supply so she can use her EBM before any additional formula is given.  Pump parts, assembly, disassembly and cleaning reviewed.  #24 flange size is appropriate for today but may need to increase to a #27 size flange tomorrow.  Started the pump so she could evaluate the flange size and pain level.  She had no complaint of pain with the pumping.  She will save any EBM she may obtain and feed back to baby.  Mother is familiar with hand expression.    Mom made aware of O/P services, breastfeeding support groups, community resources, and our phone # for post-discharge questions. Father and visitor present.  Mother was very sleepy so reminded father to call me for any questions/concerns she may have.  RN updated.   Maternal Data Formula Feeding for Exclusion: No Has patient been taught Hand Expression?: Yes Does the patient have breastfeeding experience prior to this delivery?: Yes  Feeding Feeding Type: Breast Fed  LATCH Score                   Interventions    Lactation Tools  Discussed/Used Pump Review: Setup, frequency, and cleaning;Milk Storage Initiated by:: Paul Dykes Date initiated:: 09/19/17   Consult Status Consult Status: Follow-up Date: 09/20/17 Follow-up type: In-patient    Dayveon Halley R Kaikoa Magro 09/19/2017, 12:26 PM

## 2017-09-19 NOTE — Consult Note (Signed)
Neonatology Note:   Attendance at C-section:    I was asked by Dr. Alwyn Pea to attend this repeat C/S at 36 4/7 weeks due to failed TOLAC. The mother is a G2P1 O pos, GBS neg with premature ROM yesterday, fibromyalgia, complex medical history of arthritis/chronic pain, and AMA. She got 1 dose of Betamethasone yesterday morning and a dose of Tylenol when she spiked a temperature of 100.7 degrees about 1.5 hours before delivery. No antenatal antibiotics. ROM 21 hours prior to delivery, fluid clear. Difficult extraction, vacuum-assisted delivery. Infant floppy and cyanotic at birth, without spontaneous respirations. Delayed cord clamping was not done. I performed bulb suctioning to remove some thick mucous from the nares and throat, then applied PPV. HR was about 50 initially, but rose to about 100 by 1 minute; color also improved. At 2 minutes, the baby began to breathe on her won and PPV was stopped. With stimulation, she began to cry. She appeared a bit glassy-eyed at 2 minutes, but this had resolved by 4-5 minutes.Tone improved and was normal by 5 minutes. We did DeLee suctioning of the upper esophagus and pharynx due to some thick secretions the baby was having trouble clearing, getting about 2-3 ml clear mucous out. O2 saturation was 97% in room air at 6 minutes. Ap 2/9. Lungs clear to ausc in DR. Infant is able to remain with her mother for skin to skin time under nursing supervision. Transferred to the care of Pediatrician.   Real Cons, MD

## 2017-09-19 NOTE — Anesthesia Postprocedure Evaluation (Signed)
Anesthesia Post Note  Patient: Marcia Bowers  Procedure(s) Performed: CESAREAN SECTION (N/A ) BILATERAL TUBAL LIGATION (Bilateral )     Patient location during evaluation: Mother Baby Anesthesia Type: Epidural Level of consciousness: awake and alert and oriented Pain management: satisfactory to patient Vital Signs Assessment: post-procedure vital signs reviewed and stable Respiratory status: spontaneous breathing and nonlabored ventilation Cardiovascular status: stable Postop Assessment: no headache, no backache, no signs of nausea or vomiting, adequate PO intake and patient able to bend at knees (patient up walking) Anesthetic complications: no    Last Vitals:  Vitals:   09/19/17 0635 09/19/17 0747  BP: (!) 92/58 (!) 104/56  Pulse: 84 87  Resp: 16 18  Temp:  37.2 C  SpO2: 96%     Last Pain:  Vitals:   09/19/17 0747  TempSrc: Axillary  PainSc:    Pain Goal:                 Willa Rough

## 2017-09-20 NOTE — Lactation Note (Signed)
This note was copied from a baby's chart. Lactation Consultation Note  Patient Name: Marcia Bowers [id number] Date: 09/20/2017 Reason for consult: Follow-up assessment;Late-preterm 34-36.6wks  P2 mother whose infant is now 28 hours old.    Baby swaddled and sleeping on mother's chest as I arrived.  Mother's 38 year old in bed with her and mother is having a hard time staying awake.  I stressed the importance of staying awake while holding baby.  Mother will lay baby in bassinet if she becomes too sleepy.  Mother admitted that she has not been pumping regularly when I questioned her.  This was reviewed yesterday.  Again, I stressed the importance of frequent pumping to help milk supply.  Mother wants to breast and bottle feed anyway until her milk "comes in" and is not too concerned about using her EBM rather than formula.  She feels like latching is going well and does not need any assistance.  Encouraged her to call as needed for latch assistance, questions or concerns.    Reviewed the LPTI volume guidelines for her 30 hour old baby.  Mother verbalized understanding.  Continue STS, hand expression before and after feedings.  Mother will call as needed.     Maternal Data Formula Feeding for Exclusion: No Has patient been taught Hand Expression?: Yes Does the patient have breastfeeding experience prior to this delivery?: Yes              Consult Status Consult Status: Follow-up Date: 09/21/17 Follow-up type: In-patient    Marcia Bowers 09/20/2017, 11:49 AM

## 2017-09-20 NOTE — Progress Notes (Signed)
Subjective: Postpartum Day 1: Cesarean Delivery Patient reports incisional pain, tolerating PO and no problems voiding.    Objective: Vitals:   09/19/17 1600 09/19/17 1945 09/19/17 2345 09/20/17 0618  BP: 98/62 95/62 (!) 90/59 120/83  Pulse: 86 63 67 73  Resp: 18 18 18 18   Temp: 98.3 F (36.8 C) 98.3 F (36.8 C) 98 F (36.7 C) 98.2 F (36.8 C)  TempSrc: Oral Oral Oral Oral  SpO2: 97% 98%    Weight:      Height:        Physical Exam:  General: alert and cooperative Lochia: appropriate Uterine Fundus: firm Incision: healing well, no significant drainage, no dehiscence, no significant erythema DVT Evaluation: No evidence of DVT seen on physical exam. Negative Homan's sign. No cords or calf tenderness. No significant calf/ankle edema.  Recent Labs    09/18/17 1023 09/19/17 0352  HGB 10.9* 10.3*  HCT 34.8* 32.3*    Assessment/Plan: Status post Cesarean section. Doing well postoperatively.  Continue current care, discharge home tomorrow if stable.  Marikay Alar 09/20/2017, 6:34 AM

## 2017-09-21 NOTE — Lactation Note (Signed)
This note was copied from a baby's chart. Lactation Consultation Note  Patient Name: Girl Dynasia Kercheval [id number] Date: 09/21/2017 Reason for consult: Follow-up assessment;Late-preterm 34-36.6wks;Hyperbilirubinemia  P2 mother whose infant is now 60 hours old.  Baby is a LPTI at 36+4 weeks and under phototherapy.  Mother continues to breastfeed and supplements with Fawn Kirk.  Mother's breasts are fuller today and she feels like her milk is starting to "come in"  She has no breast/nipple soreness.  Encouraged to continue to feed 8-12 times/24 hours or sooner if baby shows feeding cues.  Continue hand expression before/after feedings.  Mother will call for latch assistance as needed.  Father present.     Maternal Data Formula Feeding for Exclusion: No Has patient been taught Hand Expression?: Yes Does the patient have breastfeeding experience prior to this delivery?: Yes  Feeding Feeding Type: Breast Fed Length of feed: 41 min  LATCH Score                   Interventions    Lactation Tools Discussed/Used     Consult Status Consult Status: Follow-up Date: 09/22/17 Follow-up type: In-patient    Nereida Schepp R Emett Stapel 09/21/2017, 3:12 PM

## 2017-09-21 NOTE — Lactation Note (Signed)
This note was copied from a baby's chart. Lactation Consultation Note  Patient Name: Marcia Bowers [id number] Date: 09/21/2017   [facility] Follow Up Visit: Mother sleeping; will attempt to return later today.  Father in room feeding baby.    Darrion Wyszynski R Aryani Daffern 09/21/2017, 10:30 AM

## 2017-09-21 NOTE — Progress Notes (Deleted)
Post Partum Day 1 Subjective: no complaints, voiding and tolerating PO  Objective: Blood pressure 105/60, pulse 84, temperature 98.4 F (36.9 C), temperature source Oral, resp. rate 16, height 5\' 3"  (1.6 m), weight 65.3 kg, last menstrual period 01/06/2017, SpO2 100 %, unknown if currently breastfeeding.  Physical Exam:  General: alert, cooperative and appears stated age Lochia: appropriate Uterine Fundus: firm Incision:  DVT Evaluation: No evidence of DVT seen on physical exam.  Recent Labs    09/19/17 0352  HGB 10.3*  HCT 32.3*    Assessment/Plan: Plan for discharge tomorrow and Circumcision prior to discharge   LOS: 3 days   [place] 09/21/2017, 11:20 AM

## 2017-09-21 NOTE — Progress Notes (Signed)
Subjective: Postpartum Day 2 Cesarean Delivery Repeat Failed TOLAC Patient reports tolerating PO, + flatus and no problems voiding.    Objective: Vital signs in last 24 hours: Temp:  [98.4 F (36.9 C)-98.9 F (37.2 C)] 98.4 F (36.9 C) (09/01 0639) Pulse Rate:  [78-90] 84 (09/01 0639) Resp:  [16-18] 16 (09/01 0639) BP: (87-105)/(55-60) 105/60 (09/01 0639) SpO2:  [100 %] 100 % (08/31 2157)  Physical Exam:  General: alert, cooperative and appears stated age Lochia: appropriate Uterine Fundus: firm Incision: CDI DVT Evaluation: No evidence of DVT seen on physical exam.  Recent Labs    09/19/17 0352  HGB 10.3*  HCT 32.3*    Assessment/Plan: Status post Cesarean section. Doing well postoperatively.  Continue current care.  Marcia Bowers A Aneka Fagerstrom CNM 09/21/2017, 11:22 AM

## 2017-09-22 MED ORDER — IBUPROFEN 600 MG PO TABS: 600.0000 mg | ORAL_TABLET | Freq: Four times a day (QID) | ORAL | 0 refills | Status: DC

## 2017-09-22 MED ORDER — OXYCODONE-ACETAMINOPHEN 5-325 MG PO TABS: 1.0000 | ORAL_TABLET | ORAL | 0 refills | Status: DC | PRN

## 2017-09-22 NOTE — Lactation Note (Signed)
This note was copied from a baby's chart. Lactation Consultation Note  Patient Name: Marcia Bowers [id number] Date: 09/22/2017 Reason for consult: Follow-up assessment;Late-preterm 34-36.6wks;Hyperbilirubinemia  P2 mother whose infant is now 70 hours old  Mother is being discharged today and baby will remain under double phototherapy as a baby patient  Baby just finished breastfeeding as I entered.  Stressed the importance of increasing volumes of supplementation to >30 mls today.  Reminded mother to breastfeed first and to be sure baby is actively breastfeeding without sleeping at the breast.  Immediately after breastfeeding supplement with formula to volumes of 30+.  Asked mother to be very diligent about getting baby to eat every three hours.  She will call for assistance with feedings as needed.  Baby will get another bilirubin level drawn this evening.  Mother plans to put bassinet in the sunlight after feeding.  RN in room to do mother's discharge and aware of feeding plan.     Maternal Data Formula Feeding for Exclusion: No Has patient been taught Hand Expression?: Yes Does the patient have breastfeeding experience prior to this delivery?: Yes  Feeding Feeding Type: Breast Fed Length of feed: 15 min  LATCH Score Latch: Grasps breast easily, tongue down, lips flanged, rhythmical sucking.  Audible Swallowing: A few with stimulation  Type of Nipple: Everted at rest and after stimulation  Comfort (Breast/Nipple): Soft / non-tender  Hold (Positioning): No assistance needed to correctly position infant at breast.  LATCH Score: 9  Interventions    Lactation Tools Discussed/Used     Consult Status Consult Status: Follow-up Date: 09/23/17 Follow-up type: In-patient    Erick Murin R Terran Klinke 09/22/2017, 10:18 AM

## 2017-09-22 NOTE — Discharge Summary (Signed)
Repeat C/S OB Discharge Summary     Patient Name: Marcia Bowers DOB: [birth year] MRN: [id number]  Date of admission: 09/18/2017 Delivering MD: Marcia Bowers  Date of delivery: 09/19/2017 Type of delivery: Repeat C/S Failed TLOC, Arrest of descent at 10cm with BLT  Newborn Data: Sex: Marcia Bowers Circumcision: No Live born female  Birth Weight: 6 lb 2.9 oz (2805 g) APGAR: 2, 9  Newborn Delivery   Birth date/time:  09/19/2017 01:52:00 Delivery type:  C-Section, Vacuum Assisted Trial of labor:  Yes C-section categorization:  Repeat     Feeding: breast and bottle Infant remains in hospital for billi issues until resolution, mother being discharge, but rooming in with baby.   Admitting diagnosis: 36WKS,LEAKING Intrauterine pregnancy: [redacted]w[redacted]d     Secondary diagnosis:  Active Problems:   PROM (premature rupture of membranes)   Normal postpartum course                                Complications: Failed TLOC , arrest of descent at 10cm                                                             Intrapartum Procedures: cesarean: low cervical, transverse and tubal ligation Postpartum Procedures: none Complications-Operative and Postpartum: none Augmentation: Pitocin   History of Present Illness: Ms. Marcia Bowers is a 37 y.o. female, G2P1102, who presents at [redacted]w[redacted]d weeks gestation. The patient has been followed at  [facility] and Gynecology  Her pregnancy has been complicated by: Patient Active Problem List   Diagnosis Date Noted  . Normal postpartum course 09/22/2017  . PROM (premature rupture of membranes) 09/18/2017  . Primary osteoarthritis of both knees 11/05/2016  . Myofascial pain syndrome 03/19/2016  . Other chronic pain 03/19/2016  . Other fatigue 03/19/2016  . Myalgia 03/19/2016  . Pain of both hip joints 03/19/2016  . Arthritis, senescent 03/19/2016  . Arthralgia of both knees 03/19/2016  . History of vitamin D deficiency 03/19/2016  . BV  (bacterial vaginosis)   . Yeast infection   . Trichomonas   . Chlamydia   . Gonorrhea   . Fibroids   . Umbilical hernia 01/24/2011  . UNSPECIFIED VITAMIN D DEFICIENCY 06/03/2008  . ABNORMAL PAP SMEAR, LGSIL 08/21/2007  . BACK PAIN, LUMBAR 07/30/2007  . BACK PAIN, UPPER 07/30/2007  . MIGRAINE HEADACHE 07/02/2007  . DENTAL CARIES 07/02/2007  . LEG PAIN, CHRONIC 07/02/2007    Hospital course:  Induction of Labor With Cesarean Section  37 y.o. yo G2P1102 at [redacted]w[redacted]d was admitted to the hospital 09/18/2017 for induction of labor. Patient had a labor course significant for arrest of descent at 10cm with Failed TOLAC. The patient went for cesarean section due to Arrest of Descent, and delivered a Viable infant,09/19/2017  Membrane Rupture Time/Date: 4:30 AM ,09/18/2017   Details of operation can be found in separate operative Note.  Patient had an uncomplicated postpartum course. She is ambulating, tolerating a regular diet, passing flatus, and urinating well.  Patient is discharged and left to room in with baby for billi issues in stable condition on 09/22/17. She was consented for cesarean, with Marcia Bowers performing a Reapeat LTCS with BTL under spinal anesthesia, with delivery of  a viable Marcia Bowers, with weight and Apgars as listed below. Infant was in good condition and remained at the patient's bedside.  The patient was taken to recovery in good condition.  Patient planned to Breast and bottle feed.  On post-op day 1, patient was doing well, tolerating a regular diet, with Hgb of 10.3.  Throughout her stay, her physical exam was WNL, her incision was intact with 25% dried browish blood on honeycomb dressing, and her vital signs remained stable.  By post-op day 2, she was up ad lib, tolerating a regular diet, with good pain control with po med.  She was deemed to have received the full benefit of her hospital stay, and was discharged home in stable condition.  Contraceptive choice was BLT at delivery.    Physical  exam  Vitals:   09/21/17 0639 09/21/17 1456 09/21/17 2218 09/22/17 0640  BP: 105/60 108/61 101/71 97/64  Pulse: 84 82 86 81  Resp: 16 18 18 18   Temp: 98.4 F (36.9 C) 99.2 F (37.3 C) 98.7 F (37.1 C) 98.1 F (36.7 C)  TempSrc: Oral  Oral Oral  SpO2:  99% 100%   Weight:      Height:       General: alert, cooperative and no distress Lochia: appropriate Uterine Fundus: firm Incision: Healing well with no significant drainage, No significant erythema, dressing dry and intact woth 25% dried blood visible. Perineum: Intact DVT Evaluation: No evidence of DVT seen on physical exam. Negative Homan's sign. No cords or calf tenderness. +1 pitting edema.   Labs: Lab Results  Component Value Date   WBC 26.4 (H) 09/19/2017   HGB 10.3 (L) 09/19/2017   HCT 32.3 (L) 09/19/2017   MCV 93.9 09/19/2017   PLT 240 09/19/2017   CMP Latest Ref Rng & Units 03/21/2016  Glucose 65 - 99 mg/dL 81  BUN 7 - 25 mg/dL 10  Creatinine 6.29 - 5.28 mg/dL 4.13  Sodium 244 - 010 mmol/L 138  Potassium 3.5 - 5.3 mmol/L 4.0  Chloride 98 - 110 mmol/L 104  CO2 20 - 31 mmol/L 27  Calcium 8.6 - 10.2 mg/dL 9.1  Total Protein 6.1 - 8.1 g/dL 6.9  Total Bilirubin 0.2 - 1.2 mg/dL 0.9  Alkaline Phos 33 - 115 U/L 39  AST 10 - 30 U/L 18  ALT 6 - 29 U/L 12    Date of discharge: 09/22/2017 Discharge Diagnoses: Arrest of descent at 10cm, failed TOLAC, RCS with BTL, normal postpartum.  Discharge instruction: per After Visit Summary and "Baby and Me Booklet".   Activity:           pelvic rest Advance as tolerated. Pelvic rest for 6 weeks.  Diet:                routine Medications: PNV, Ibuprofen, Colace and Percocet Postpartum contraception: Tubal Ligation Condition:  Pt discharge to room in with baby in stable condition.  Leukocytosis history: Followed by hematology.   Meds: Allergies as of 09/22/2017   No Known Allergies     Medication List    STOP taking these medications   HYDROcodone-acetaminophen 5-325  MG tablet Commonly known as:  NORCO/VICODIN     TAKE these medications   cholecalciferol 1000 units tablet Commonly known as:  VITAMIN D Take 1,000 Units by mouth every morning.   cyclobenzaprine 10 MG tablet Commonly known as:  FLEXERIL TAKE ONE TABLET BY MOUTH ONCE DAILY AT BEDTIME   diclofenac 75 MG EC tablet Commonly known as:  VOLTAREN One BID prn pain. Take with food   docusate sodium 100 MG capsule Commonly known as:  COLACE Take 100 mg by mouth 2 (two) times daily as needed. For constipation   ferrous sulfate 325 (65 FE) MG tablet Take 325 mg by mouth 2 (two) times daily with a meal.   ibuprofen 600 MG tablet Commonly known as:  ADVIL,MOTRIN Take 1 tablet (600 mg total) by mouth every 6 (six) hours.   oxyCODONE-acetaminophen 5-325 MG tablet Commonly known as:  PERCOCET/ROXICET Take 1 tablet by mouth every 4 (four) hours as needed (pain scale 4-7).   prenatal multivitamin Tabs tablet Take 1 tablet by mouth every morning.            Discharge Care Instructions  (From admission, onward)         Start     Ordered   09/22/17 0000  Discharge wound care:    Comments:  Take dressing off on day 5-7 postpartum.  Report increased drainage, redness or warmth. Clean with water, let soap trickle down body. Can leave steri strips on until they fall off or take them off gently at day 10. Keep open to air, clean and dry.   09/22/17 0858          Discharge Follow Up:  Follow-up Information    [facility] Follow up in 4 week(s).   Specialty:  Obstetrics and Gynecology Why:  PPV and inscision check. Contact information: [address] [place] [address] [phone]           [place], NP-C, CNM 09/22/2017, 10:33 AM  Dale [place], FNP

## 2017-09-23 ENCOUNTER — Ambulatory Visit: Payer: Self-pay

## 2017-09-23 NOTE — Lactation Note (Signed)
This note was copied from a baby's chart. Lactation Consultation Note  Patient Name: Marcia Bowers JASNK'N Date: 09/23/2017  baby is 7 days old ,  Double Photo tx D/C this am, and baby is for a D/C.  Baby is at 7 % weight loss. 11-7 am mom supplemented 20 -55 ml  And back to the breast this am.   Per mom milk is not in yet in. Mom has a Jette.  LC recommended - STS feedings until the baby  can stay awake for a feedings.  If the baby I sluggish feed breast 15 -20 mins , and supplement.  If the baby is really hungry offer the 1st breast and 2nd breast.  May not need to supplement after feeding.  Also to enhance the milk coming in, post pump after 4-5 feedings for 10 - 15 mins.  Per mom has a DEBP at home.  Mom denies soreness. Sore nipple and engorgement prevention and tx reviewed.  Mother informed of post-discharge support and given phone number to the lactation department, including services for phone call assistance; out-patient appointments; and breastfeeding support group. List of other breastfeeding resources in the community given in the handout. Encouraged mother to call for problems or concerns related to breastfeeding.                   Maternal Data    Feeding    LATCH Score                   Interventions    Lactation Tools Discussed/Used     Consult Status      Marcia Bowers 09/23/2017, 3:36 PM

## 2017-10-01 ENCOUNTER — Other Ambulatory Visit: Payer: Self-pay | Admitting: Rheumatology

## 2017-10-01 NOTE — Telephone Encounter (Signed)
Last Visit: 05/13/17 Next visit: 11/11/17  Okay to refill per Dr. Estanislado Pandy

## 2017-10-07 ENCOUNTER — Other Ambulatory Visit: Payer: Self-pay | Admitting: *Deleted

## 2017-10-07 MED ORDER — METHOCARBAMOL 500 MG PO TABS: 500.0000 mg | ORAL_TABLET | Freq: Two times a day (BID) | ORAL | 2 refills | Status: DC | PRN

## 2017-10-07 NOTE — Telephone Encounter (Signed)
Refill request received via fax  Last visit: 05/13/17 Next Visit: 11/11/17  Okay to refill per Dr. Estanislado Pandy

## 2017-10-16 ENCOUNTER — Ambulatory Visit: Payer: Self-pay | Admitting: General Surgery

## 2017-10-17 ENCOUNTER — Encounter (HOSPITAL_BASED_OUTPATIENT_CLINIC_OR_DEPARTMENT_OTHER): Payer: Self-pay | Admitting: *Deleted

## 2017-10-17 ENCOUNTER — Other Ambulatory Visit: Payer: Self-pay

## 2017-10-24 ENCOUNTER — Ambulatory Visit (HOSPITAL_BASED_OUTPATIENT_CLINIC_OR_DEPARTMENT_OTHER): Payer: Medicaid Other | Admitting: Anesthesiology

## 2017-10-24 ENCOUNTER — Ambulatory Visit (HOSPITAL_BASED_OUTPATIENT_CLINIC_OR_DEPARTMENT_OTHER)
Admission: RE | Admit: 2017-10-24 | Discharge: 2017-10-24 | Disposition: A | Discharge status: Home / Self Care | Payer: Medicaid Other | Source: Ambulatory Visit | Attending: General Surgery | Admitting: General Surgery

## 2017-10-24 ENCOUNTER — Encounter (HOSPITAL_BASED_OUTPATIENT_CLINIC_OR_DEPARTMENT_OTHER): Payer: Self-pay

## 2017-10-24 ENCOUNTER — Encounter (HOSPITAL_BASED_OUTPATIENT_CLINIC_OR_DEPARTMENT_OTHER)
Admission: RE | Disposition: A | Discharge status: Home / Self Care | Payer: Self-pay | Source: Ambulatory Visit | Attending: General Surgery

## 2017-10-24 ENCOUNTER — Other Ambulatory Visit: Payer: Self-pay

## 2017-10-24 DIAGNOSIS — Z8249 Family history of ischemic heart disease and other diseases of the circulatory system: Secondary | ICD-10-CM | POA: Diagnosis not present

## 2017-10-24 DIAGNOSIS — Z833 Family history of diabetes mellitus: Secondary | ICD-10-CM | POA: Insufficient documentation

## 2017-10-24 DIAGNOSIS — Z79899 Other long term (current) drug therapy: Secondary | ICD-10-CM | POA: Insufficient documentation

## 2017-10-24 DIAGNOSIS — Z800 Family history of malignant neoplasm of digestive organs: Secondary | ICD-10-CM | POA: Insufficient documentation

## 2017-10-24 DIAGNOSIS — Z8349 Family history of other endocrine, nutritional and metabolic diseases: Secondary | ICD-10-CM | POA: Insufficient documentation

## 2017-10-24 DIAGNOSIS — Z8041 Family history of malignant neoplasm of ovary: Secondary | ICD-10-CM | POA: Diagnosis not present

## 2017-10-24 DIAGNOSIS — M549 Dorsalgia, unspecified: Secondary | ICD-10-CM | POA: Insufficient documentation

## 2017-10-24 DIAGNOSIS — Z811 Family history of alcohol abuse and dependence: Secondary | ICD-10-CM | POA: Insufficient documentation

## 2017-10-24 DIAGNOSIS — Z803 Family history of malignant neoplasm of breast: Secondary | ICD-10-CM | POA: Diagnosis not present

## 2017-10-24 DIAGNOSIS — G709 Myoneural disorder, unspecified: Secondary | ICD-10-CM | POA: Insufficient documentation

## 2017-10-24 DIAGNOSIS — M797 Fibromyalgia: Secondary | ICD-10-CM | POA: Insufficient documentation

## 2017-10-24 DIAGNOSIS — K429 Umbilical hernia without obstruction or gangrene: Secondary | ICD-10-CM | POA: Insufficient documentation

## 2017-10-24 DIAGNOSIS — Z820 Family history of epilepsy and other diseases of the nervous system: Secondary | ICD-10-CM | POA: Insufficient documentation

## 2017-10-24 DIAGNOSIS — R51 Headache: Secondary | ICD-10-CM | POA: Insufficient documentation

## 2017-10-24 DIAGNOSIS — Z8042 Family history of malignant neoplasm of prostate: Secondary | ICD-10-CM | POA: Diagnosis not present

## 2017-10-24 DIAGNOSIS — M1990 Unspecified osteoarthritis, unspecified site: Secondary | ICD-10-CM | POA: Insufficient documentation

## 2017-10-24 DIAGNOSIS — Z8261 Family history of arthritis: Secondary | ICD-10-CM | POA: Diagnosis not present

## 2017-10-24 DIAGNOSIS — Z8049 Family history of malignant neoplasm of other genital organs: Secondary | ICD-10-CM | POA: Insufficient documentation

## 2017-10-24 HISTORY — PX: UMBILICAL HERNIA REPAIR: SHX196

## 2017-10-24 HISTORY — PX: INSERTION OF MESH: SHX5868

## 2017-10-24 LAB — POCT PREGNANCY, URINE: Preg Test, Ur: NEGATIVE

## 2017-10-24 SURGERY — REPAIR, HERNIA, UMBILICAL, ADULT
Anesthesia: General

## 2017-10-24 MED ORDER — KETOROLAC TROMETHAMINE 30 MG/ML IJ SOLN
INTRAMUSCULAR | Status: AC
  Filled 2017-10-24: qty 1

## 2017-10-24 MED ORDER — CHLORHEXIDINE GLUCONATE CLOTH 2 % EX PADS: 6.0000 | MEDICATED_PAD | Freq: Once | CUTANEOUS | Status: DC

## 2017-10-24 MED ORDER — PROPOFOL 10 MG/ML IV BOLUS
INTRAVENOUS | Status: DC | PRN
  Administered 2017-10-24: 150 mg via INTRAVENOUS

## 2017-10-24 MED ORDER — FENTANYL CITRATE (PF) 100 MCG/2ML IJ SOLN
INTRAMUSCULAR | Status: AC
  Filled 2017-10-24: qty 2

## 2017-10-24 MED ORDER — OXYCODONE HCL 5 MG PO TABS: 5.0000 mg | ORAL_TABLET | Freq: Once | ORAL | Status: DC | PRN

## 2017-10-24 MED ORDER — CEFAZOLIN SODIUM-DEXTROSE 2-4 GM/100ML-% IV SOLN
INTRAVENOUS | Status: AC
  Filled 2017-10-24: qty 100

## 2017-10-24 MED ORDER — DEXAMETHASONE SODIUM PHOSPHATE 10 MG/ML IJ SOLN
INTRAMUSCULAR | Status: AC
  Filled 2017-10-24: qty 1

## 2017-10-24 MED ORDER — PHENYLEPHRINE 40 MCG/ML (10ML) SYRINGE FOR IV PUSH (FOR BLOOD PRESSURE SUPPORT)
PREFILLED_SYRINGE | INTRAVENOUS | Status: AC
  Filled 2017-10-24: qty 10

## 2017-10-24 MED ORDER — ACETAMINOPHEN 500 MG PO TABS
ORAL_TABLET | ORAL | Status: AC
  Filled 2017-10-24: qty 2

## 2017-10-24 MED ORDER — OXYCODONE HCL 5 MG/5ML PO SOLN: 5.0000 mg | Freq: Once | ORAL | Status: DC | PRN

## 2017-10-24 MED ORDER — ONDANSETRON HCL 4 MG/2ML IJ SOLN
INTRAMUSCULAR | Status: DC | PRN
  Administered 2017-10-24: 4 mg via INTRAVENOUS

## 2017-10-24 MED ORDER — KETOROLAC TROMETHAMINE 30 MG/ML IJ SOLN
INTRAMUSCULAR | Status: DC | PRN
  Administered 2017-10-24: 30 mg via INTRAVENOUS

## 2017-10-24 MED ORDER — CELECOXIB 200 MG PO CAPS: 200.0000 mg | ORAL_CAPSULE | ORAL | Status: DC

## 2017-10-24 MED ORDER — CEFAZOLIN SODIUM-DEXTROSE 2-4 GM/100ML-% IV SOLN
2.0000 g | INTRAVENOUS | Status: AC
  Administered 2017-10-24: 2 g via INTRAVENOUS

## 2017-10-24 MED ORDER — PHENYLEPHRINE HCL 10 MG/ML IJ SOLN
INTRAMUSCULAR | Status: DC | PRN
  Administered 2017-10-24: 80 ug via INTRAVENOUS

## 2017-10-24 MED ORDER — OXYCODONE-ACETAMINOPHEN 5-325 MG PO TABS: 1.0000 | ORAL_TABLET | Freq: Four times a day (QID) | ORAL | 0 refills | Status: AC | PRN

## 2017-10-24 MED ORDER — BUPIVACAINE-EPINEPHRINE 0.25% -1:200000 IJ SOLN
INTRAMUSCULAR | Status: DC | PRN
  Administered 2017-10-24: 10 mL

## 2017-10-24 MED ORDER — SUGAMMADEX SODIUM 200 MG/2ML IV SOLN
INTRAVENOUS | Status: DC | PRN
  Administered 2017-10-24: 100 mg via INTRAVENOUS
  Administered 2017-10-24: 200 mg via INTRAVENOUS

## 2017-10-24 MED ORDER — MIDAZOLAM HCL 2 MG/2ML IJ SOLN
INTRAMUSCULAR | Status: AC
  Filled 2017-10-24: qty 2

## 2017-10-24 MED ORDER — LIDOCAINE HCL (CARDIAC) PF 100 MG/5ML IV SOSY
PREFILLED_SYRINGE | INTRAVENOUS | Status: DC | PRN
  Administered 2017-10-24: 50 mg via INTRAVENOUS

## 2017-10-24 MED ORDER — GABAPENTIN 300 MG PO CAPS: 300.0000 mg | ORAL_CAPSULE | ORAL | Status: DC

## 2017-10-24 MED ORDER — ROCURONIUM BROMIDE 50 MG/5ML IV SOSY
PREFILLED_SYRINGE | INTRAVENOUS | Status: AC
  Filled 2017-10-24: qty 5

## 2017-10-24 MED ORDER — SCOPOLAMINE 1 MG/3DAYS TD PT72: 1.0000 | MEDICATED_PATCH | Freq: Once | TRANSDERMAL | Status: DC | PRN

## 2017-10-24 MED ORDER — ACETAMINOPHEN 500 MG PO TABS
1000.0000 mg | ORAL_TABLET | ORAL | Status: AC
  Administered 2017-10-24: 1000 mg via ORAL

## 2017-10-24 MED ORDER — ONDANSETRON HCL 4 MG/2ML IJ SOLN
INTRAMUSCULAR | Status: AC
  Filled 2017-10-24: qty 2

## 2017-10-24 MED ORDER — FENTANYL CITRATE (PF) 100 MCG/2ML IJ SOLN
25.0000 ug | INTRAMUSCULAR | Status: DC | PRN
  Administered 2017-10-24: 50 ug via INTRAVENOUS
  Administered 2017-10-24 (×2): 25 ug via INTRAVENOUS

## 2017-10-24 MED ORDER — ROCURONIUM BROMIDE 100 MG/10ML IV SOLN
INTRAVENOUS | Status: DC | PRN
  Administered 2017-10-24: 40 mg via INTRAVENOUS

## 2017-10-24 MED ORDER — LIDOCAINE 2% (20 MG/ML) 5 ML SYRINGE
INTRAMUSCULAR | Status: AC
  Filled 2017-10-24: qty 5

## 2017-10-24 MED ORDER — MIDAZOLAM HCL 2 MG/2ML IJ SOLN
1.0000 mg | INTRAMUSCULAR | Status: DC | PRN
  Administered 2017-10-24: 2 mg via INTRAVENOUS

## 2017-10-24 MED ORDER — LACTATED RINGERS IV SOLN
INTRAVENOUS | Status: DC
  Administered 2017-10-24: 11:00:00 via INTRAVENOUS

## 2017-10-24 MED ORDER — ONDANSETRON HCL 4 MG/2ML IJ SOLN: 4.0000 mg | Freq: Four times a day (QID) | INTRAMUSCULAR | Status: DC | PRN

## 2017-10-24 MED ORDER — DEXAMETHASONE SODIUM PHOSPHATE 4 MG/ML IJ SOLN
INTRAMUSCULAR | Status: DC | PRN
  Administered 2017-10-24: 10 mg via INTRAVENOUS

## 2017-10-24 MED ORDER — FENTANYL CITRATE (PF) 100 MCG/2ML IJ SOLN
50.0000 ug | INTRAMUSCULAR | Status: DC | PRN
  Administered 2017-10-24: 100 ug via INTRAVENOUS

## 2017-10-24 SURGICAL SUPPLY — 46 items
ADH SKN CLS APL DERMABOND .7 (GAUZE/BANDAGES/DRESSINGS) ×1
APL SKNCLS STERI-STRIP NONHPOA (GAUZE/BANDAGES/DRESSINGS)
BENZOIN TINCTURE PRP APPL 2/3 (GAUZE/BANDAGES/DRESSINGS) IMPLANT
BLADE CLIPPER SURG (BLADE) IMPLANT
BLADE SURG 15 STRL LF DISP TIS (BLADE) ×1 IMPLANT
BLADE SURG 15 STRL SS (BLADE) ×3
CHLORAPREP W/TINT 26ML (MISCELLANEOUS) ×3 IMPLANT
CLOSURE WOUND 1/2 X4 (GAUZE/BANDAGES/DRESSINGS)
COVER BACK TABLE 60X90IN (DRAPES) ×3 IMPLANT
COVER MAYO STAND STRL (DRAPES) ×3 IMPLANT
DECANTER SPIKE VIAL GLASS SM (MISCELLANEOUS) ×3 IMPLANT
DERMABOND ADVANCED (GAUZE/BANDAGES/DRESSINGS) ×2
DERMABOND ADVANCED .7 DNX12 (GAUZE/BANDAGES/DRESSINGS) ×1 IMPLANT
DRAPE LAPAROTOMY TRNSV 102X78 (DRAPE) ×3 IMPLANT
DRAPE UTILITY XL STRL (DRAPES) ×3 IMPLANT
DRSG TEGADERM 4X4.75 (GAUZE/BANDAGES/DRESSINGS) IMPLANT
ELECT COATED BLADE 2.86 ST (ELECTRODE) ×3 IMPLANT
ELECT REM PT RETURN 9FT ADLT (ELECTROSURGICAL) ×3
ELECTRODE REM PT RTRN 9FT ADLT (ELECTROSURGICAL) ×1 IMPLANT
GAUZE SPONGE 4X4 12PLY STRL LF (GAUZE/BANDAGES/DRESSINGS) IMPLANT
GLOVE BIO SURGEON STRL SZ7.5 (GLOVE) ×3 IMPLANT
GOWN STRL REUS W/ TWL LRG LVL3 (GOWN DISPOSABLE) ×2 IMPLANT
GOWN STRL REUS W/TWL LRG LVL3 (GOWN DISPOSABLE) ×6
MESH VENTRALEX ST 2.5 CRC MED (Mesh General) ×2 IMPLANT
NDL HYPO 25X1 1.5 SAFETY (NEEDLE) ×1 IMPLANT
NEEDLE HYPO 22GX1.5 SAFETY (NEEDLE) IMPLANT
NEEDLE HYPO 25X1 1.5 SAFETY (NEEDLE) ×3 IMPLANT
NS IRRIG 1000ML POUR BTL (IV SOLUTION) IMPLANT
PACK BASIN DAY SURGERY FS (CUSTOM PROCEDURE TRAY) ×3 IMPLANT
PENCIL BUTTON HOLSTER BLD 10FT (ELECTRODE) ×3 IMPLANT
SLEEVE SCD COMPRESS KNEE MED (MISCELLANEOUS) IMPLANT
SPONGE LAP 18X18 RF (DISPOSABLE) ×3 IMPLANT
STRIP CLOSURE SKIN 1/2X4 (GAUZE/BANDAGES/DRESSINGS) IMPLANT
SUT MON AB 4-0 PC3 18 (SUTURE) ×3 IMPLANT
SUT NOVA NAB DX-16 0-1 5-0 T12 (SUTURE) ×2 IMPLANT
SUT NOVA NAB GS-21 1 T12 (SUTURE) ×3 IMPLANT
SUT SILK 3 0 SH 30 (SUTURE) IMPLANT
SUT VIC AB 2-0 SH 27 (SUTURE) ×3
SUT VIC AB 2-0 SH 27XBRD (SUTURE) ×1 IMPLANT
SUT VIC AB 3-0 54X BRD REEL (SUTURE) IMPLANT
SUT VIC AB 3-0 BRD 54 (SUTURE)
SUT VIC AB 3-0 SH 27 (SUTURE)
SUT VIC AB 3-0 SH 27X BRD (SUTURE) IMPLANT
SYR CONTROL 10ML LL (SYRINGE) ×3 IMPLANT
TOWEL GREEN STERILE FF (TOWEL DISPOSABLE) ×6 IMPLANT
TOWEL OR NON WOVEN STRL DISP B (DISPOSABLE) ×3 IMPLANT

## 2017-10-24 NOTE — Op Note (Signed)
10/24/2017  12:19 PM  PATIENT:  Marcia Bowers  37 y.o. female  PRE-OPERATIVE DIAGNOSIS:  Umbilical hernia  POST-OPERATIVE DIAGNOSIS:  Umbilical hernia  PROCEDURE:  Procedure(s): UMBILICAL HERNIA REPAIR ERAS PATHWAY (N/A) INSERTION OF MESH (N/A)  SURGEON:  Surgeon(s) and Role:    * Jovita Kussmaul, MD - Primary  PHYSICIAN ASSISTANT:   ASSISTANTS: none   ANESTHESIA:   local and general  EBL:  minimal   BLOOD ADMINISTERED:none  DRAINS: none   LOCAL MEDICATIONS USED:  MARCAINE     SPECIMEN:  No Specimen  DISPOSITION OF SPECIMEN:  N/A  COUNTS:  YES  TOURNIQUET:  * No tourniquets in log *  DICTATION: .Dragon Dictation   After informed consent was obtained the patient was brought to the operating room and placed in the supine position on the operating table.  After adequate induction of general anesthesia the patient's abdomen was prepped with ChloraPrep, allowed to dry, and draped in usual sterile manner.  An appropriate timeout was performed.  The area around the umbilicus was then infiltrated with quarter percent Marcaine.  A curvilinear incision was made with a 15 blade knife along the lower edge of the umbilicus.  The incision was carried through the skin and subcutaneous tissue sharply with the electrocautery.  The dissection was carried down to the fascia of the abdominal wall.  The hernia sac was then opened by blunt hemostat dissection.  There were no visceral contents within the sac.  The inner abdominal wall was palpated with a finger and no other hernia defects were appreciated.  Next a 6.4 cm piece of ventralex mesh was chosen.  The mesh was placed through the fascial defect and with traction on the anchors the mesh was in good apposition to the abdominal wall.  The mesh was oriented with the coated side towards the bowel.  The fascial defect was then closed with interrupted #1 Novafil stitches incorporating the anchors of the mesh and the excess anchor was trimmed  away.  The wound was then irrigated with copious amounts of saline.  The fascial defect seemed well repaired.  The deep layer of the wound was then closed with 2-0 Vicryl interrupted stitches.  The skin was then closed with a running 4-0 Monocryl subcuticular stitch.  Dermabond dressings were applied.  The patient tolerated the procedure well.  At the end of the case all needle sponge and instrument counts were correct.  The patient was then awakened and taken to recovery in stable condition.  PLAN OF CARE: Discharge to home after PACU  PATIENT DISPOSITION:  PACU - hemodynamically stable.   Delay start of Pharmacological VTE agent (>24hrs) due to surgical blood loss or risk of bleeding: not applicable

## 2017-10-24 NOTE — Anesthesia Preprocedure Evaluation (Signed)
Anesthesia Evaluation  Patient identified by MRN, date of birth, ID band Patient awake    Reviewed: Allergy & Precautions, H&P , NPO status , Patient's Chart, lab work & pertinent test results  Airway Mallampati: II   Neck ROM: full    Dental   Pulmonary neg pulmonary ROS,    breath sounds clear to auscultation       Cardiovascular negative cardio ROS   Rhythm:regular Rate:Normal     Neuro/Psych  Headaches,  Neuromuscular disease    GI/Hepatic   Endo/Other    Renal/GU      Musculoskeletal  (+) Arthritis , Fibromyalgia -  Abdominal   Peds  Hematology   Anesthesia Other Findings   Reproductive/Obstetrics                             Anesthesia Physical Anesthesia Plan  ASA: II  Anesthesia Plan: General   Post-op Pain Management:    Induction: Intravenous  PONV Risk Score and Plan: 3 and Ondansetron, Dexamethasone, Midazolam, Scopolamine patch - Pre-op and Treatment may vary due to age or medical condition  Airway Management Planned: Oral ETT  Additional Equipment:   Intra-op Plan:   Post-operative Plan: Extubation in OR  Informed Consent: I have reviewed the patients History and Physical, chart, labs and discussed the procedure including the risks, benefits and alternatives for the proposed anesthesia with the patient or authorized representative who has indicated his/her understanding and acceptance.     Plan Discussed with: CRNA, Anesthesiologist and Surgeon  Anesthesia Plan Comments:         Anesthesia Quick Evaluation

## 2017-10-24 NOTE — Anesthesia Postprocedure Evaluation (Signed)
Anesthesia Post Note  Patient: LAKELA KUBA  Procedure(s) Performed: UMBILICAL HERNIA REPAIR ERAS PATHWAY (N/A ) INSERTION OF MESH (N/A )     Patient location during evaluation: PACU Anesthesia Type: General Level of consciousness: awake and alert Pain management: pain level controlled Vital Signs Assessment: post-procedure vital signs reviewed and stable Respiratory status: spontaneous breathing, nonlabored ventilation, respiratory function stable and patient connected to nasal cannula oxygen Cardiovascular status: blood pressure returned to baseline and stable Postop Assessment: no apparent nausea or vomiting Anesthetic complications: no    Last Vitals:  Vitals:   10/24/17 1345 10/24/17 1400  BP: 119/76   Pulse: (!) 53 60  Resp: 11 14  Temp:    SpO2: 100% 100%    Last Pain:  Vitals:   10/24/17 1345  TempSrc:   PainSc: 7                  Shantel Wesely S

## 2017-10-24 NOTE — Anesthesia Procedure Notes (Signed)
Performed by: Katriana Dortch W, CRNA       

## 2017-10-24 NOTE — Interval H&P Note (Signed)
History and Physical Interval Note:  10/24/2017 11:30 AM  Marcia Bowers  has presented today for surgery, with the diagnosis of Umbilical hernia  The various methods of treatment have been discussed with the patient and family. After consideration of risks, benefits and other options for treatment, the patient has consented to  Procedure(s): UMBILICAL HERNIA REPAIR ERAS PATHWAY (N/A) INSERTION OF MESH (N/A) as a surgical intervention .  The patient's history has been reviewed, patient examined, no change in status, stable for surgery.  I have reviewed the patient's chart and labs.  Questions were answered to the patient's satisfaction.     Autumn Messing III

## 2017-10-24 NOTE — Progress Notes (Addendum)
Pt awakened after being in PACU X 10 mins., c/o pain in abdomen, attempted to reposition in reclining position in PACU.Marland Kitchen Amy Evans Rn assisted me placed 2 blankets under knees and elevated HOB. Pt c/o that position hurt her c-section site. Amy Placed head back a little. Pt stated" ice pack on abdomen did not feel good" and took the ice pack off. Applied 2 warm blankets on pt. Later requested to lay down because of pain in abdomen. Explained evidently she will need to sit up before she goes home. Explained she will not be pain free , that she is going to hurt some.  Pt continues to c/o pain is 10 /10 and falls asleep. Explained it would help if she sat up and had ice pack. Refused to sit up and have ice pack. States it is hard to take a deep breath, told her she is breathing fine, watching her and her monitor.

## 2017-10-24 NOTE — Anesthesia Procedure Notes (Signed)
Procedure Name: Intubation Performed by: Devynne Sturdivant W, CRNA Pre-anesthesia Checklist: Patient identified, Emergency Drugs available, Suction available and Patient being monitored Patient Re-evaluated:Patient Re-evaluated prior to induction Oxygen Delivery Method: Circle system utilized Preoxygenation: Pre-oxygenation with 100% oxygen Induction Type: IV induction Ventilation: Mask ventilation without difficulty Laryngoscope Size: Miller and 2 Grade View: Grade I Tube type: Oral Number of attempts: 1 Airway Equipment and Method: Stylet Placement Confirmation: ETT inserted through vocal cords under direct vision,  positive ETCO2 and breath sounds checked- equal and bilateral Secured at: 22 cm Tube secured with: Tape Dental Injury: Teeth and Oropharynx as per pre-operative assessment        

## 2017-10-24 NOTE — H&P (Signed)
Marcia Bowers  Location: [facility] Patient #: [id number] DOB: 02-Oct-1980 Single / Language: [place] / Race: Black or African American Female   History of Present Illness The patient is a 37 year old female who presents with abdominal pain. We are asked to see the patient in consultation by Dr. Estanislado Pandy to evaluate her for an umbilical hernia. The patient is a 37 year old black female who is about one month status post C-section. During both of her pregnancies she reports having a painful bulge at the umbilicus. She has been able to reduce it. The bulge has been painful for her though. She denies any fevers or chills. She denies any nausea or vomiting.   Past Surgical History Aneurysm Repair  Cesarean Section - Multiple  Oral Surgery   Diagnostic Studies History  Colonoscopy  never Mammogram  >3 years ago  Allergies No Known Drug Allergies  Allergies Reconciled   Medication History Cyclobenzaprine HCl (10MG  Tablet, Oral) Active. oxyCODONE-Acetaminophen (5-325MG  Tablet, Oral) Active. Cyclobenzaprine HCl (Oral) Specific strength unknown - Active. Methocarbamol (Oral) Specific strength unknown - Active. Tylenol PM Extra Strength (Oral) Specific strength unknown - Active. Advil (Oral) Specific strength unknown - Active. D 5000 (5000UNIT Capsule, Oral) Active. Iron (Oral) Specific strength unknown - Active. Prenatal (Oral) Active. Stool Softener (Oral) Specific strength unknown - Active. Medications Reconciled  Social History  Alcohol use  Occasional alcohol use. Caffeine use  Carbonated beverages, Tea. No drug use  Tobacco use  Never smoker.  Family History  Alcohol Abuse  Daughter, Family Members In General. Anesthetic complications  Family Members In General. Arthritis  Daughter, Family Members In General, Father, Mother. Breast Cancer  Daughter, Family Members In General. Cervical Cancer  Family Members In General. Colon  Cancer  Family Members In General. Diabetes Mellitus  Family Members In General. Heart Disease  Family Members In General, Father, Son. Heart disease in female family member before age 55  Hypertension  Family Members In General, Father. Malignant Neoplasm Of Pancreas  Daughter. Melanoma  Daughter, Family Members In General. Migraine Headache  Family Members In General. Ovarian Cancer  Daughter, Family Members In General. Prostate Cancer  Family Members In General. Seizure disorder  Family Members In General. Thyroid problems  Mother.  Pregnancy / Birth History  Age at menarche  11 years. Contraceptive History  Contraceptive implant, Depo-provera. Gravida  2 Length (months) of breastfeeding  >24 Maternal age  79-35 Para  2 Regular periods   Other Problems  Arthritis  Back Pain     Review of Systems  General Not Present- Appetite Loss, Chills, Fatigue, Fever, Night Sweats, Weight Gain and Weight Loss. Skin Not Present- Change in Wart/Mole, Dryness, Hives, Jaundice, New Lesions, Non-Healing Wounds, Rash and Ulcer. HEENT Present- Wears glasses/contact lenses. Not Present- Earache, Hearing Loss, Hoarseness, Nose Bleed, Oral Ulcers, Ringing in the Ears, Seasonal Allergies, Sinus Pain, Sore Throat, Visual Disturbances and Yellow Eyes. Respiratory Not Present- Bloody sputum, Chronic Cough, Difficulty Breathing, Snoring and Wheezing. Breast Not Present- Breast Mass, Breast Pain, Nipple Discharge and Skin Changes. Cardiovascular Present- Leg Cramps and Swelling of Extremities. Not Present- Chest Pain, Difficulty Breathing Lying Down, Palpitations, Rapid Heart Rate and Shortness of Breath. Gastrointestinal Present- Abdominal Pain, Constipation and Hemorrhoids. Not Present- Bloating, Bloody Stool, Change in Bowel Habits, Chronic diarrhea, Difficulty Swallowing, Excessive gas, Gets full quickly at meals, Indigestion, Nausea, Rectal Pain and Vomiting. Female Genitourinary  Not Present- Frequency, Nocturia, Painful Urination, Pelvic Pain and Urgency. Musculoskeletal Present- Back Pain, Joint Pain, Joint Stiffness,  Muscle Pain, Muscle Weakness and Swelling of Extremities. Neurological Not Present- Decreased Memory, Fainting, Headaches, Numbness, Seizures, Tingling, Tremor, Trouble walking and Weakness. Psychiatric Not Present- Anxiety, Bipolar, Change in Sleep Pattern, Depression, Fearful and Frequent crying. Endocrine Present- Cold Intolerance. Not Present- Excessive Hunger, Hair Changes, Heat Intolerance, Hot flashes and New Diabetes.  Vitals Weight: 135.2 lb Height: 63in Body Surface Area: 1.64 m Body Mass Index: 23.95 kg/m  Pain Level: 2/10 Temp.: 97.40F(Temporal)  Pulse: 88 (Regular)  P.OX: 98% (Room air) BP: 108/72 (Sitting, Left Arm, Standard)       Physical Exam General Mental Status-Alert. General Appearance-Consistent with stated age. Hydration-Well hydrated. Voice-Normal.  Head and Neck Head-normocephalic, atraumatic with no lesions or palpable masses. Trachea-midline. Thyroid Gland Characteristics - normal size and consistency.  Eye Eyeball - Bilateral-Extraocular movements intact. Sclera/Conjunctiva - Bilateral-No scleral icterus.  Chest and Lung Exam Chest and lung exam reveals -quiet, even and easy respiratory effort with no use of accessory muscles and on auscultation, normal breath sounds, no adventitious sounds and normal vocal resonance. Inspection Chest Wall - Normal. Back - normal.  Cardiovascular Cardiovascular examination reveals -normal heart sounds, regular rate and rhythm with no murmurs and normal pedal pulses bilaterally.  Abdomen Note: The abdomen is soft with minimal tenderness. There is a small palpable fascial defect at the umbilicus and the hernia reduces easily. The hernia is tender to palpation.   Neurologic Neurologic evaluation reveals -alert and oriented x 3 with  no impairment of recent or remote memory. Mental Status-Normal.  Musculoskeletal Normal Exam - Left-Upper Extremity Strength Normal and Lower Extremity Strength Normal. Normal Exam - Right-Upper Extremity Strength Normal and Lower Extremity Strength Normal.  Lymphatic Head & Neck  General Head & Neck Lymphatics: Bilateral - Description - Normal. Axillary  General Axillary Region: Bilateral - Description - Normal. Tenderness - Non Tender. Femoral & Inguinal  Generalized Femoral & Inguinal Lymphatics: Bilateral - Description - Normal. Tenderness - Non Tender.    Assessment & Plan UMBILICAL HERNIA WITHOUT OBSTRUCTION OR GANGRENE (K42.9) Impression: The patient appears to have a small but symptomatic umbilical hernia. Because of the risk of incarceration and strangulation and think she would benefit from having this fixed. She would also like to have this done. I have discussed with her in detail the risks and benefits of the operation as well as some of the technical aspects including the use of mesh and she understands and wishes to proceed. Current Plans Pt Education - Hernia: discussed with patient and provided information.

## 2017-10-24 NOTE — Transfer of Care (Signed)
Immediate Anesthesia Transfer of Care Note  Patient: Marcia Bowers  Procedure(s) Performed: UMBILICAL HERNIA REPAIR ERAS PATHWAY (N/A ) INSERTION OF MESH (N/A )  Patient Location: PACU  Anesthesia Type:General  Level of Consciousness: awake and sedated  Airway & Oxygen Therapy: Patient Spontanous Breathing and Patient connected to face mask oxygen  Post-op Assessment: Report given to RN and Post -op Vital signs reviewed and stable  Post vital signs: Reviewed and stable  Last Vitals:  Vitals Value Taken Time  BP 127/79 10/24/2017 12:41 PM  Temp    Pulse 72 10/24/2017 12:42 PM  Resp 19 10/24/2017 12:42 PM  SpO2 100 % 10/24/2017 12:42 PM  Vitals shown include unvalidated device data.  Last Pain:  Vitals:   10/24/17 1032  TempSrc: Oral  PainSc: 3       Patients Stated Pain Goal: 3 (94/17/40 8144)  Complications: No apparent anesthesia complications

## 2017-10-24 NOTE — Discharge Instructions (Signed)
NO TYLENOL BEFORE 5PM TODAY. NO IBuprofen before 6PM today!     Post Anesthesia Home Care Instructions  Activity: Get plenty of rest for the remainder of the day. A responsible individual must stay with you for 24 hours following the procedure.  For the next 24 hours, DO NOT: -Drive a car -Paediatric nurse -Drink alcoholic beverages -Take any medication unless instructed by your physician -Make any legal decisions or sign important papers.  Meals: Start with liquid foods such as gelatin or soup. Progress to regular foods as tolerated. Avoid greasy, spicy, heavy foods. If nausea and/or vomiting occur, drink only clear liquids until the nausea and/or vomiting subsides. Call your physician if vomiting continues.  Special Instructions/Symptoms: Your throat may feel dry or sore from the anesthesia or the breathing tube placed in your throat during surgery. If this causes discomfort, gargle with warm salt water. The discomfort should disappear within 24 hours.  If you had a scopolamine patch placed behind your ear for the management of post- operative nausea and/or vomiting:  1. The medication in the patch is effective for 72 hours, after which it should be removed.  Wrap patch in a tissue and discard in the trash. Wash hands thoroughly with soap and water. 2. You may remove the patch earlier than 72 hours if you experience unpleasant side effects which may include dry mouth, dizziness or visual disturbances. 3. Avoid touching the patch. Wash your hands with soap and water after contact with the patch.

## 2017-10-27 ENCOUNTER — Encounter (HOSPITAL_BASED_OUTPATIENT_CLINIC_OR_DEPARTMENT_OTHER): Payer: Self-pay | Admitting: General Surgery

## 2017-10-28 NOTE — Progress Notes (Deleted)
Office Visit Note  Patient: Marcia Bowers             Date of Birth: 09-Nov-1980           MRN: [id number]             PCP: Lucila Maine Referring: Lucila Maine Visit Date: 11/11/2017 Occupation: @GUAROCC @  Subjective:  No chief complaint on file.   History of Present Illness: Marcia Bowers is a 37 y.o. female ***   Activities of Daily Living:  Patient reports morning stiffness for *** {minute/hour:19697}.   Patient {ACTIONS;DENIES/REPORTS:21021675::"Denies"} nocturnal pain.  Difficulty dressing/grooming: {ACTIONS;DENIES/REPORTS:21021675::"Denies"} Difficulty climbing stairs: {ACTIONS;DENIES/REPORTS:21021675::"Denies"} Difficulty getting out of chair: {ACTIONS;DENIES/REPORTS:21021675::"Denies"} Difficulty using hands for taps, buttons, cutlery, and/or writing: {ACTIONS;DENIES/REPORTS:21021675::"Denies"}  No Rheumatology ROS completed.   PMFS History:  Patient Active Problem List   Diagnosis Date Noted  . Normal postpartum course 09/22/2017  . PROM (premature rupture of membranes) 09/18/2017  . Primary osteoarthritis of both knees 11/05/2016  . Myofascial pain syndrome 03/19/2016  . Other chronic pain 03/19/2016  . Other fatigue 03/19/2016  . Myalgia 03/19/2016  . Pain of both hip joints 03/19/2016  . Arthritis, senescent 03/19/2016  . Arthralgia of both knees 03/19/2016  . History of vitamin D deficiency 03/19/2016  . BV (bacterial vaginosis)   . Yeast infection   . Trichomonas   . Chlamydia   . Gonorrhea   . Fibroids   . Umbilical hernia 01/24/2011  . UNSPECIFIED VITAMIN D DEFICIENCY 06/03/2008  . ABNORMAL PAP SMEAR, LGSIL 08/21/2007  . BACK PAIN, LUMBAR 07/30/2007  . BACK PAIN, UPPER 07/30/2007  . MIGRAINE HEADACHE 07/02/2007  . DENTAL CARIES 07/02/2007  . LEG PAIN, CHRONIC 07/02/2007    Past Medical History:  Diagnosis Date  . Abnormal Pap smear   . Anemia   . BV (bacterial vaginosis)   . Chlamydia   . Fibroids   .  Fibromyalgia   . Gonorrhea   . History of chicken pox   . Trichomonas   . Vaginal Pap smear, abnormal   . Yeast infection     Family History  Problem Relation Age of Onset  . Thyroid disease Mother   . Hypertension Father   . Heart disease Other        Heart on the right  . Heart disease Paternal Grandfather   . Hypertension Paternal Grandfather   . Diabetes Paternal Grandfather   . Heart disease Paternal Grandmother   . Hypertension Paternal Grandmother   . Healthy Sister   . Healthy Brother   . Anesthesia problems Neg Hx    Past Surgical History:  Procedure Laterality Date  . CESAREAN SECTION  04/13/2011   Procedure: CESAREAN SECTION;  Surgeon: Purcell Nails, MD;  Location: [facility];  Service: Gynecology;  Laterality: N/A;  Primary cesarean section with delivery of baby girl at 7. Apgars 8/9.  Marland Kitchen CESAREAN SECTION N/A 09/19/2017   Procedure: CESAREAN SECTION;  Surgeon: Essie Hart, MD;  Location: [facility];  Service: Obstetrics;  Laterality: N/A;  . INSERTION OF MESH N/A 10/24/2017   Procedure: INSERTION OF MESH;  Surgeon: Griselda Miner, MD;  Location: [facility];  Service: General;  Laterality: N/A;  . TUBAL LIGATION Bilateral 09/19/2017   Procedure: BILATERAL TUBAL LIGATION;  Surgeon: Essie Hart, MD;  Location: [facility];  Service: Obstetrics;  Laterality: Bilateral;  . UMBILICAL HERNIA REPAIR N/A 10/24/2017   Procedure: UMBILICAL HERNIA REPAIR ERAS PATHWAY;  Surgeon: Carolynne Edouard,  Lynetta Mare, MD;  Location: [facility];  Service: General;  Laterality: N/A;  . WISDOM TOOTH EXTRACTION     Social History   Social History Narrative  . Not on file    Objective: Vital Signs: LMP 01/06/2017    Physical Exam   Musculoskeletal Exam: ***  CDAI Exam: CDAI Score: Not documented Patient Global Assessment: Not documented; Provider Global Assessment: Not documented Swollen: Not documented; Tender: Not documented Joint Exam   Not  documented   There is currently no information documented on the homunculus. Go to the Rheumatology activity and complete the homunculus joint exam.  Investigation: No additional findings.  Imaging: No results found.  Recent Labs: Lab Results  Component Value Date   WBC 26.4 (H) 09/19/2017   HGB 10.3 (L) 09/19/2017   PLT 240 09/19/2017   NA 138 03/21/2016   K 4.0 03/21/2016   CL 104 03/21/2016   CO2 27 03/21/2016   GLUCOSE 81 03/21/2016   BUN 10 03/21/2016   CREATININE 0.84 03/21/2016   BILITOT 0.9 03/21/2016   ALKPHOS 39 03/21/2016   AST 18 03/21/2016   ALT 12 03/21/2016   PROT 6.9 03/21/2016   ALBUMIN 3.9 03/21/2016   CALCIUM 9.1 03/21/2016   GFRAA >89 03/21/2016    Speciality Comments: No specialty comments available.  Procedures:  No procedures performed Allergies: Patient has no known allergies.   Assessment / Plan:     Visit Diagnoses: Primary osteoarthritis of both knees  Myofascial pain syndrome  Other fatigue  Other chronic pain  History of migraine  History of vitamin D deficiency   Orders: No orders of the defined types were placed in this encounter.  No orders of the defined types were placed in this encounter.   Face-to-face time spent with patient was *** minutes. Greater than 50% of time was spent in counseling and coordination of care.  Follow-Up Instructions: No follow-ups on file.   Gearldine Bienenstock, PA-C  Note - This record has been created using Dragon software.  Chart creation errors have been sought, but may not always  have been located. Such creation errors do not reflect on  the standard of medical care.

## 2017-11-11 ENCOUNTER — Ambulatory Visit: Payer: Medicaid Other | Admitting: Physician Assistant

## 2017-11-11 ENCOUNTER — Telehealth: Payer: Self-pay | Admitting: Rheumatology

## 2017-11-11 NOTE — Telephone Encounter (Signed)
Patient called to r/s their appointment with Hazel Sams.  CB#[phone]

## 2017-11-11 NOTE — Progress Notes (Signed)
Office Visit Note  Patient: Marcia Bowers             Date of Birth: 01-07-1981           MRN: [id number]             PCP: Lucila Maine Referring: Teena Irani, PA-C Visit Date: 11/12/2017 Occupation: @GUAROCC @  Subjective:  Myalgias   History of Present Illness: Marcia Bowers is a 37 y.o. female with history of osteoarthritis and myofascial pain.  Patient reports that she gave birth to her daughter on 09/19/2017.  She states that during her pregnancy her myofascial pain syndrome was well controlled.  She reports that last week she started having increased myalgias in her lower extremities which she feels is related to the weather changes.  She reports that her knee pain is improved.  She denies any joint swelling.  She states she has some discomfort when climbing steps.  She denies any other joint pain or joint swelling at this time.  She continues to have chronic fatigue.  She has some difficulty falling asleep at night but is able to stay asleep.  She continues to take Robaxin 500 mg twice daily as needed and Flexeril 10 mg 1 tablet by mouth as needed.  She does not need any refills at this time.    Activities of Daily Living:  Patient reports morning stiffness for10  minutes.   Patient Reports nocturnal pain.  Difficulty dressing/grooming: Denies Difficulty climbing stairs: Reports Difficulty getting out of chair: Reports Difficulty using hands for taps, buttons, cutlery, and/or writing: Denies  Review of Systems  Constitutional: Positive for fatigue.  HENT: Negative for mouth sores, mouth dryness and nose dryness.   Eyes: Negative for pain, visual disturbance and dryness.  Respiratory: Negative for cough, hemoptysis, shortness of breath and difficulty breathing.   Cardiovascular: Negative for chest pain, palpitations, hypertension and swelling in legs/feet.  Gastrointestinal: Positive for constipation. Negative for blood in stool and diarrhea.  Endocrine:  Negative for increased urination.  Genitourinary: Negative for painful urination.  Musculoskeletal: Positive for myalgias, morning stiffness and myalgias. Negative for arthralgias, joint pain, joint swelling, muscle weakness and muscle tenderness.  Skin: Negative for color change, pallor, rash, hair loss, nodules/bumps, skin tightness, ulcers and sensitivity to sunlight.  Allergic/Immunologic: Negative for susceptible to infections.  Neurological: Negative for dizziness, numbness, headaches and weakness.  Hematological: Negative for swollen glands.  Psychiatric/Behavioral: Positive for sleep disturbance. Negative for depressed mood. The patient is nervous/anxious.     PMFS History:  Patient Active Problem List   Diagnosis Date Noted  . Normal postpartum course 09/22/2017  . PROM (premature rupture of membranes) 09/18/2017  . Primary osteoarthritis of both knees 11/05/2016  . Myofascial pain syndrome 03/19/2016  . Other chronic pain 03/19/2016  . Other fatigue 03/19/2016  . Myalgia 03/19/2016  . Pain of both hip joints 03/19/2016  . Arthritis, senescent 03/19/2016  . Arthralgia of both knees 03/19/2016  . History of vitamin D deficiency 03/19/2016  . BV (bacterial vaginosis)   . Yeast infection   . Trichomonas   . Chlamydia   . Gonorrhea   . Fibroids   . Umbilical hernia 01/24/2011  . UNSPECIFIED VITAMIN D DEFICIENCY 06/03/2008  . ABNORMAL PAP SMEAR, LGSIL 08/21/2007  . BACK PAIN, LUMBAR 07/30/2007  . BACK PAIN, UPPER 07/30/2007  . MIGRAINE HEADACHE 07/02/2007  . DENTAL CARIES 07/02/2007  . LEG PAIN, CHRONIC 07/02/2007    Past Medical History:  Diagnosis  Date  . Abnormal Pap smear   . Anemia   . BV (bacterial vaginosis)   . Chlamydia   . Fibroids   . Fibromyalgia   . Gonorrhea   . History of chicken pox   . Trichomonas   . Vaginal Pap smear, abnormal   . Yeast infection     Family History  Problem Relation Age of Onset  . Thyroid disease Mother   . Hypertension  Father   . Heart disease Other        Heart on the right  . Heart disease Paternal Grandfather   . Hypertension Paternal Grandfather   . Diabetes Paternal Grandfather   . Heart disease Paternal Grandmother   . Hypertension Paternal Grandmother   . Healthy Sister   . Healthy Brother   . Anesthesia problems Neg Hx    Past Surgical History:  Procedure Laterality Date  . CESAREAN SECTION  04/13/2011   Procedure: CESAREAN SECTION;  Surgeon: Purcell Nails, MD;  Location: [facility];  Service: Gynecology;  Laterality: N/A;  Primary cesarean section with delivery of baby girl at 7. Apgars 8/9.  Marland Kitchen CESAREAN SECTION N/A 09/19/2017   Procedure: CESAREAN SECTION;  Surgeon: Essie Hart, MD;  Location: [facility];  Service: Obstetrics;  Laterality: N/A;  . INSERTION OF MESH N/A 10/24/2017   Procedure: INSERTION OF MESH;  Surgeon: Griselda Miner, MD;  Location: [facility];  Service: General;  Laterality: N/A;  . TUBAL LIGATION Bilateral 09/19/2017   Procedure: BILATERAL TUBAL LIGATION;  Surgeon: Essie Hart, MD;  Location: [facility];  Service: Obstetrics;  Laterality: Bilateral;  . UMBILICAL HERNIA REPAIR N/A 10/24/2017   Procedure: UMBILICAL HERNIA REPAIR ERAS PATHWAY;  Surgeon: Griselda Miner, MD;  Location: [facility];  Service: General;  Laterality: N/A;  . WISDOM TOOTH EXTRACTION     Social History   Social History Narrative  . Not on file    Objective: Vital Signs: BP 112/74 (BP Location: Left Arm, Patient Position: Sitting, Cuff Size: Small)   Pulse 93   Resp 12   Ht 5\' 3"  (1.6 m)   Wt 132 lb 3.2 oz (60 kg)   LMP 01/06/2017   Breastfeeding? Yes   BMI 23.42 kg/m    Physical Exam  Constitutional: She is oriented to person, place, and time. She appears well-developed and well-nourished.  HENT:  Head: Normocephalic and atraumatic.  Eyes: Conjunctivae and EOM are normal.  Neck: Normal range of motion.  Cardiovascular: Normal rate, regular  rhythm, normal heart sounds and intact distal pulses.  Pulmonary/Chest: Effort normal and breath sounds normal.  Abdominal: Soft. Bowel sounds are normal.  Lymphadenopathy:    She has no cervical adenopathy.  Neurological: She is alert and oriented to person, place, and time.  Skin: Skin is warm and dry. Capillary refill takes less than 2 seconds.  Psychiatric: She has a normal mood and affect. Her behavior is normal.  Nursing note and vitals reviewed.    Musculoskeletal Exam: C-spine, thoracic spine, lumbar spine good range of motion.  No midline spinal tenderness.  No SI joint tenderness.  Shoulder joints, elbow joints, wrist joints, MCPs and PIPs, DIPs good range of motion with no synovitis.  She has complete fist formation bilaterally.  Hip joints, knee joints, ankle joints, MTPs, PIPs, DIPs good range of motion with no synovitis.  No warmth or effusion bilateral knee joints.  Positive tender points on exam.  CDAI Exam: CDAI Score: Not documented Patient  Global Assessment: Not documented; Provider Global Assessment: Not documented Swollen: Not documented; Tender: Not documented Joint Exam   Not documented   There is currently no information documented on the homunculus. Go to the Rheumatology activity and complete the homunculus joint exam.  Investigation: No additional findings.  Imaging: No results found.  Recent Labs: Lab Results  Component Value Date   WBC 26.4 (H) 09/19/2017   HGB 10.3 (L) 09/19/2017   PLT 240 09/19/2017   NA 138 03/21/2016   K 4.0 03/21/2016   CL 104 03/21/2016   CO2 27 03/21/2016   GLUCOSE 81 03/21/2016   BUN 10 03/21/2016   CREATININE 0.84 03/21/2016   BILITOT 0.9 03/21/2016   ALKPHOS 39 03/21/2016   AST 18 03/21/2016   ALT 12 03/21/2016   PROT 6.9 03/21/2016   ALBUMIN 3.9 03/21/2016   CALCIUM 9.1 03/21/2016   GFRAA >89 03/21/2016    Speciality Comments: No specialty comments available.  Procedures:  No procedures  performed Allergies: Patient has no known allergies.   Assessment / Plan:     Visit Diagnoses: Primary osteoarthritis of both knees: No warmth or effusion.  She has good range of motion with no discomfort.  She has occasional discomfort when she is climbing steps.  Overall her knee pain is improved.  Myofascial pain syndrome: During her pregnancy she did not experience myalgias and muscle tenderness.  About 1 week ago she started having myalgias in her lower extremities.  She continues to have chronic fatigue and insomnia.  She has difficulty falling asleep at night but is able to stay asleep.  She continues to take Robaxin 500 mg 1 tablet twice daily PRN for muscle spasms and Flexeril 1 tablet by mouth at bedtime PRN for muscle spasms.  She does not need any refills at this time.  She was encouraged to stay active and exercise on a regular basis.  She will follow-up in the office in 6 months.  Other fatigue:  Chronic and related to insomnia.   Other chronic pain: She takes Advil PRN and Percocet for pain relief.  History of migraine: She has not had any recent migraines.  History of vitamin D deficiency: She takes vitamin D 1000 units by mouth daily  Orders: No orders of the defined types were placed in this encounter.  No orders of the defined types were placed in this encounter.    Follow-Up Instructions: Return in about 6 months (around 05/14/2018) for Osteoarthritis, Myofascial pain .   Gearldine Bienenstock, PA-C  Note - This record has been created using Dragon software.  Chart creation errors have been sought, but may not always  have been located. Such creation errors do not reflect on  the standard of medical care.

## 2017-11-12 ENCOUNTER — Encounter: Payer: Self-pay | Admitting: Physician Assistant

## 2017-11-12 ENCOUNTER — Ambulatory Visit: Payer: Medicaid Other | Admitting: Physician Assistant

## 2017-11-12 VITALS — BP 112/74 | HR 93 | Resp 12 | Ht 63.0 in | Wt 132.2 lb

## 2017-11-12 DIAGNOSIS — M170 Bilateral primary osteoarthritis of knee: Secondary | ICD-10-CM

## 2017-11-12 DIAGNOSIS — R5383 Other fatigue: Secondary | ICD-10-CM

## 2017-11-12 DIAGNOSIS — M7918 Myalgia, other site: Secondary | ICD-10-CM | POA: Diagnosis not present

## 2017-11-12 DIAGNOSIS — G8929 Other chronic pain: Secondary | ICD-10-CM

## 2017-11-12 DIAGNOSIS — Z8669 Personal history of other diseases of the nervous system and sense organs: Secondary | ICD-10-CM

## 2017-11-12 DIAGNOSIS — Z8639 Personal history of other endocrine, nutritional and metabolic disease: Secondary | ICD-10-CM

## 2018-05-13 ENCOUNTER — Encounter: Payer: Self-pay | Admitting: Rheumatology

## 2018-05-13 ENCOUNTER — Telehealth: Payer: Medicaid Other | Admitting: Rheumatology

## 2018-05-14 ENCOUNTER — Telehealth (INDEPENDENT_AMBULATORY_CARE_PROVIDER_SITE_OTHER): Payer: BC Managed Care – PPO | Admitting: Rheumatology

## 2018-05-14 ENCOUNTER — Encounter: Payer: Self-pay | Admitting: Rheumatology

## 2018-05-14 ENCOUNTER — Other Ambulatory Visit: Payer: Self-pay

## 2018-05-14 DIAGNOSIS — Z8639 Personal history of other endocrine, nutritional and metabolic disease: Secondary | ICD-10-CM

## 2018-05-14 DIAGNOSIS — R5383 Other fatigue: Secondary | ICD-10-CM

## 2018-05-14 DIAGNOSIS — G8929 Other chronic pain: Secondary | ICD-10-CM

## 2018-05-14 DIAGNOSIS — M170 Bilateral primary osteoarthritis of knee: Secondary | ICD-10-CM | POA: Diagnosis not present

## 2018-05-14 DIAGNOSIS — Z8669 Personal history of other diseases of the nervous system and sense organs: Secondary | ICD-10-CM

## 2018-05-14 DIAGNOSIS — M7918 Myalgia, other site: Secondary | ICD-10-CM

## 2018-05-14 NOTE — Progress Notes (Signed)
Virtual Visit via Telephone Note  I connected with Marcia Bowers on 05/14/18 at  3:30 PM EDT by telephone and verified that I am speaking with the correct person using two identifiers.   I discussed the limitations, risks, security and privacy concerns of performing an evaluation and management service by telephone and the availability of in person appointments. I also discussed with the patient that there may be a patient responsible charge related to this service. The patient expressed understanding and agreed to proceed. This service was conducted via virtual visit.  The patient was located at home. I was located in my office.  Consent was obtained prior to the virtual visit and is aware of possible charges through their insurance for this visit.  The patient is an established patient.  Dr. Estanislado Pandy, MD conducted the virtual visit and Hazel Sams, PA-C acted as scribed during the service.  Office staff helped with scheduling follow up visits after the service was conducted.    CC: Pain in both knee joints   History of Present Illness: Patient is a 37 female with a past medical history of osteoarthritis and myofascial pain syndrome.  She takes Robaxin 500 mg BID PRN and Flexeril 10 mg po at bedtime for muscle spasms.  She takes ibuprofen 600 mg 1 tablet by mouth every 6 hours for pain relief.  She is having pain in both knee joints.  She denies any joint swelling.  She experiences myalgias and muscle tenderness in bilateral lower extremities.  She has chronic trochanteric bursitis.  She performs IT band stretches.   Review of Systems  Constitutional: Positive for malaise/fatigue. Negative for fever.  Eyes: Negative for photophobia, pain, discharge and redness.       +Eye dryness   Respiratory: Negative for cough, shortness of breath and wheezing.   Cardiovascular: Negative for chest pain and palpitations.  Gastrointestinal: Positive for constipation. Negative for blood in stool and  diarrhea.  Genitourinary: Negative for dysuria.  Musculoskeletal: Positive for back pain, joint pain and myalgias. Negative for neck pain.  Skin: Negative for rash.  Neurological: Negative for dizziness and headaches.  Psychiatric/Behavioral: Negative for depression. The patient is nervous/anxious and has insomnia.       Observations/Objective: Physical Exam  Constitutional: She is oriented to person, place, and time.  Neurological: She is alert and oriented to person, place, and time.  Psychiatric: Mood, memory, affect and judgment normal.   Patient reports morning stiffness for 30 minutes.   Patient reports nocturnal pain.  Difficulty dressing/grooming: Denies Difficulty climbing stairs: Denies Difficulty getting out of chair: Reports Difficulty using hands for taps, buttons, cutlery, and/or writing: Denies  Assessment and Plan: Primary osteoarthritis of both knees: She is having pain in both knee joints.  She has no joint swelling.  No mechanical symptoms.  She has been working on weight loss.  We will update knee joint x-rays at her follow up visit.  She will follow up in 6 months.   Myofascial pain syndrome: She is experiencing myalgias and muscle tenderness in bilateral lower extremities.  She has bilateral trochanteric bursitis and IT band syndrome.  She was encouraged to perform stretching exercises on a daily basis. She rarely takes Robaxin or Flexeril for muscle spasms due to currently breastfeeding.  We discussed the importance of regular exercise and good sleep hygiene.    Other fatigue:  Chronic and related to insomnia. She takes Flexeril 10 mg po at bedtime for muscle spasms and insomnia very sparingly.  Other chronic pain: She takes Advil 600 mg 1 tablet by mouth every 6 hours PRN for pain relief.  History of migraine: She has not had any recent migraines.  History of vitamin D deficiency: She takes vitamin D 1000 units by mouth daily  Follow Up  Instructions: She will follow up in 6 months.   I discussed the assessment and treatment plan with the patient. The patient was provided an opportunity to ask questions and all were answered. The patient agreed with the plan and demonstrated an understanding of the instructions.   The patient was advised to call back or seek an in-person evaluation if the symptoms worsen or if the condition fails to improve as anticipated.  I provided 25 minutes of non-face-to-face time during this encounter. Bo Merino, MD   Scribed by-  Ofilia Neas, PA-C

## 2018-05-20 ENCOUNTER — Telehealth: Payer: Self-pay | Admitting: Rheumatology

## 2018-05-20 NOTE — Telephone Encounter (Signed)
-----   Message from Carole Binning, LPN sent at [phone]  2:36 PM EDT ----- Please schedule patient for a follow up visit in 6 months. Patient was seen for a telemedicine visit. Thanks!

## 2018-05-20 NOTE — Telephone Encounter (Signed)
I LMOM for patient to call, and schedule her next 6 month follow up appt (around 11/19/2018) with Hazel Sams, PAC.

## 2018-06-12 ENCOUNTER — Telehealth: Payer: Self-pay | Admitting: Rheumatology

## 2018-06-12 NOTE — Telephone Encounter (Signed)
Patient states both elbows are tender to the touch. Rt elbow is painful with use. Pain has been going on since a month / 2 months now. Patient not sure if doctor can do anything about it, but wanted to mention it. Patient really not wanting to come in for an appt due to the fact she would have to bring  her baby with her. Please call to advise.

## 2018-06-16 NOTE — Telephone Encounter (Signed)
Attempted to contact the patient and left message for patient to call the office.  

## 2018-10-26 NOTE — Progress Notes (Signed)
Office Visit Note  Patient: Marcia Bowers             Date of Birth: 1980-07-14           MRN: [id number]             PCP: Lucila Maine Referring: Lucila Maine Visit Date: 10/27/2018 Occupation: @GUAROCC @  Subjective:  Left elbow join pain   History of Present Illness: Marcia Bowers is a 38 y.o. female with history of myofascial pain syndrome and osteoarthritis.  Patient presents today with bilateral elbow joint pain.  She states that the left elbow has progressively been getting worse over the past couple months.  She states that she is having increased difficulty lifting objects that she used to have no issues with.  She states that she has been carrying her baby in a car seat which she feels is exacerbating the discomfort. She reports that she has been having intermittent discomfort in bilateral knee joints and bilateral feet.  She denies any joint swelling.  She states that the discomfort in her feet is in the midfoot.  She denies any heel pain or pain in her toes.  She is also been having increased muscle cramps in her feet.   Activities of Daily Living:  Patient reports morning stiffness for 0 minutes.   Patient Reports nocturnal pain.  Difficulty dressing/grooming: Denies Difficulty climbing stairs: Denies Difficulty getting out of chair: Denies Difficulty using hands for taps, buttons, cutlery, and/or writing: Reports  Review of Systems  Constitutional: Positive for fatigue.  HENT: Positive for mouth dryness. Negative for mouth sores and nose dryness.   Eyes: Positive for dryness. Negative for pain, itching and visual disturbance.  Respiratory: Negative for cough, hemoptysis, shortness of breath, wheezing and difficulty breathing.   Cardiovascular: Negative for chest pain, palpitations, hypertension and swelling in legs/feet.  Gastrointestinal: Positive for constipation. Negative for abdominal pain, blood in stool and diarrhea.  Endocrine: Negative  for increased urination.  Genitourinary: Negative for difficulty urinating and painful urination.  Musculoskeletal: Positive for arthralgias, joint pain and muscle tenderness. Negative for joint swelling, myalgias, muscle weakness, morning stiffness and myalgias.  Skin: Negative for color change, pallor, rash, hair loss, nodules/bumps, skin tightness, ulcers and sensitivity to sunlight.  Allergic/Immunologic: Negative for susceptible to infections.  Neurological: Positive for headaches. Negative for dizziness, light-headedness, numbness, memory loss and weakness.  Hematological: Negative for swollen glands.  Psychiatric/Behavioral: Positive for sleep disturbance. Negative for depressed mood and confusion. The patient is not nervous/anxious.     PMFS History:  Patient Active Problem List   Diagnosis Date Noted   Normal postpartum course 09/22/2017   PROM (premature rupture of membranes) 09/18/2017   Primary osteoarthritis of both knees 11/05/2016   Myofascial pain syndrome 03/19/2016   Other chronic pain 03/19/2016   Other fatigue 03/19/2016   Myalgia 03/19/2016   Pain of both hip joints 03/19/2016   Arthritis, senescent 03/19/2016   Arthralgia of both knees 03/19/2016   History of vitamin D deficiency 03/19/2016   BV (bacterial vaginosis)    Yeast infection    Trichomonas    Chlamydia    Gonorrhea    Fibroids    Umbilical hernia 01/24/2011   UNSPECIFIED VITAMIN D DEFICIENCY 06/03/2008   ABNORMAL PAP SMEAR, LGSIL 08/21/2007   BACK PAIN, LUMBAR 07/30/2007   BACK PAIN, UPPER 07/30/2007   MIGRAINE HEADACHE 07/02/2007   DENTAL CARIES 07/02/2007   LEG PAIN, CHRONIC 07/02/2007    Past  Medical History:  Diagnosis Date   Abnormal Pap smear    Anemia    BV (bacterial vaginosis)    Chlamydia    Fibroids    Fibromyalgia    Gonorrhea    History of chicken pox    Trichomonas    Vaginal Pap smear, abnormal    Yeast infection     Family History   Problem Relation Age of Onset   Thyroid disease Mother    Hypertension Father    Heart disease Other        Heart on the right   Heart disease Paternal Grandfather    Hypertension Paternal Grandfather    Diabetes Paternal Grandfather    Heart disease Paternal Grandmother    Hypertension Paternal Grandmother    Healthy Sister    Healthy Brother    Healthy Daughter    Healthy Daughter    Anesthesia problems Neg Hx    Past Surgical History:  Procedure Laterality Date   CESAREAN SECTION  04/13/2011   Procedure: CESAREAN SECTION;  Surgeon: Purcell Nails, MD;  Location: [facility];  Service: Gynecology;  Laterality: N/A;  Primary cesarean section with delivery of baby girl at 103. Apgars 8/9.   CESAREAN SECTION N/A 09/19/2017   Procedure: CESAREAN SECTION;  Surgeon: Essie Hart, MD;  Location: [facility];  Service: Obstetrics;  Laterality: N/A;   INSERTION OF MESH N/A 10/24/2017   Procedure: INSERTION OF MESH;  Surgeon: Griselda Miner, MD;  Location: [facility];  Service: General;  Laterality: N/A;   TUBAL LIGATION Bilateral 09/19/2017   Procedure: BILATERAL TUBAL LIGATION;  Surgeon: Essie Hart, MD;  Location: [facility];  Service: Obstetrics;  Laterality: Bilateral;   UMBILICAL HERNIA REPAIR N/A 10/24/2017   Procedure: UMBILICAL HERNIA REPAIR ERAS PATHWAY;  Surgeon: Griselda Miner, MD;  Location: [facility];  Service: General;  Laterality: N/A;   WISDOM TOOTH EXTRACTION     Social History   Social History Narrative   Not on file   Immunization History  Administered Date(s) Administered   Td 07/30/2007   Tdap 04/16/2011     Objective: Vital Signs: BP 117/73 (BP Location: Left Arm, Patient Position: Sitting, Cuff Size: Normal)    Pulse 88    Resp 14    Ht 5\' 2"  (1.575 m)    Wt 135 lb 3.2 oz (61.3 kg)    BMI 24.73 kg/m    Physical Exam Vitals signs and nursing note reviewed.  Constitutional:      Appearance: She  is well-developed.  HENT:     Head: Normocephalic and atraumatic.  Eyes:     Conjunctiva/sclera: Conjunctivae normal.  Neck:     Musculoskeletal: Normal range of motion.  Cardiovascular:     Rate and Rhythm: Normal rate and regular rhythm.     Heart sounds: Normal heart sounds.  Pulmonary:     Effort: Pulmonary effort is normal.     Breath sounds: Normal breath sounds.  Abdominal:     General: Bowel sounds are normal.     Palpations: Abdomen is soft.  Lymphadenopathy:     Cervical: No cervical adenopathy.  Skin:    General: Skin is warm and dry.     Capillary Refill: Capillary refill takes less than 2 seconds.  Neurological:     Mental Status: She is alert and oriented to person, place, and time.  Psychiatric:        Behavior: Behavior normal.  Musculoskeletal Exam: C-spine, thoracic spine, and lumbar spine good ROM.  No midline spinal tenderness.  No SI joint tenderness.  Shoulder joints, elbow joints, wrist joints, MCPs, PIPs, and DIPs good ROM with no synovitis.  Bilateral lateral epicondylitis. Hip joints, knee joints, ankle joints, MTPs, PIPs, and DIPs good ROM with no synovitis.  No warmth or effusion of knee joints. Patellar tendonitis bilaterally.   No tenderness or swelling of ankle joints.  Tenderness on the plantar aspect of both feet.  No achilles tendonitis bilaterally.  No tenderness or inflammation of MTPs.    CDAI Exam: CDAI Score: -- Patient Global: --; Provider Global: -- Swollen: --; Tender: -- Joint Exam   No joint exam has been documented for this visit   There is currently no information documented on the homunculus. Go to the Rheumatology activity and complete the homunculus joint exam.  Investigation: No additional findings.  Imaging: No results found.  Recent Labs: Lab Results  Component Value Date   WBC 26.4 (H) 09/19/2017   HGB 10.3 (L) 09/19/2017   PLT 240 09/19/2017   NA 138 03/21/2016   K 4.0 03/21/2016   CL 104 03/21/2016   CO2  27 03/21/2016   GLUCOSE 81 03/21/2016   BUN 10 03/21/2016   CREATININE 0.84 03/21/2016   BILITOT 0.9 03/21/2016   ALKPHOS 39 03/21/2016   AST 18 03/21/2016   ALT 12 03/21/2016   PROT 6.9 03/21/2016   ALBUMIN 3.9 03/21/2016   CALCIUM 9.1 03/21/2016   GFRAA >89 03/21/2016    Speciality Comments: No specialty comments available.  Procedures:  No procedures performed Allergies: Patient has no known allergies.   Assessment / Plan:     Visit Diagnoses: Myofascial pain syndrome: She continues have generalized muscle aches and muscle tenderness due to myofascial pain syndrome.  She takes Robaxin 500 mg 1 tablet by mouth twice daily as needed for muscle spasms and Flexeril 10 mg 1 tablet by mouth as needed at bedtime for muscle spasms.  She takes both these medications very sparingly.  She continues to take Percocet and ibuprofen as needed for pain relief.  We discussed the importance of staying active and exercising on a regular basis.  Other fatigue: She has chronic fatigue related to insomnia.  Other chronic pain: She takes Percocet as needed for pain relief.  She also takes ibuprofen and Tylenol for breakthrough pain.  Lateral epicondylitis of both elbows: She has tenderness over the lateral epicondyle bilaterally of the joints.  She has been having 2 months.  Her symptoms are more severe in the left side.  She has been having difficulty lifting objects.  She states that lifting baby in the car carrier exacerbates her discomfort.  We discussed conservative treatment options including physical therapy, home exercises, and wearing a tennis elbow brace.  She declined physical therapy at this time but will try home exercises.  Handout of exercises were provided.  She was also given a prescription for a left tennis elbow brace.  She can apply Voltaren gel topically as needed for pain relief.  She was advised to notify us if she develops new or worsening symptoms.  She can return for cortisone  injection in the future if she fails conservative treatment options.  Primary osteoarthritis of both knees: She has good range of motion of bilateral knee joints.  No warmth or effusion was noted.  She is experiencing symptoms of patellar tendinitis bilaterally.  She is given a handout of knee exercises to perform.  She has myofascial pain in her lower extremities which causes discomfort.  She continues to take Robaxin and Flexeril as needed for muscle spasms.  Plantar fasciitis, bilateral: She presents today with symptoms of plantar fasciitis bilaterally.  She experiences increased discomfort first thing in the morning when getting out of bed.  She has no tenderness of MTP or PIP joints.  No synovitis was noted.  No Achilles tendinitis.  We discussed exercises to perform.  Discussed importance of wearing proper fitting shoes.  History of vitamin D deficiency: She is taking vitamin D supplement daily  History of migraine  Orders: No orders of the defined types were placed in this encounter.  No orders of the defined types were placed in this encounter.   Face-to-face time spent with patient was 30 minutes. Greater than 50% of time was spent in counseling and coordination of care.  Follow-Up Instructions: Return in about 6 months (around 04/27/2019) for Myofascial pain syndrome .   Gearldine Bienenstock, PA-C  Note - This record has been created using Dragon software.  Chart creation errors have been sought, but may not always  have been located. Such creation errors do not reflect on  the standard of medical care.

## 2018-10-27 ENCOUNTER — Other Ambulatory Visit: Payer: Self-pay

## 2018-10-27 ENCOUNTER — Ambulatory Visit: Payer: BC Managed Care – PPO | Admitting: Physician Assistant

## 2018-10-27 ENCOUNTER — Encounter: Payer: Self-pay | Admitting: Physician Assistant

## 2018-10-27 VITALS — BP 117/73 | HR 88 | Resp 14 | Ht 62.0 in | Wt 135.2 lb

## 2018-10-27 DIAGNOSIS — G8929 Other chronic pain: Secondary | ICD-10-CM

## 2018-10-27 DIAGNOSIS — M7918 Myalgia, other site: Secondary | ICD-10-CM | POA: Diagnosis not present

## 2018-10-27 DIAGNOSIS — M722 Plantar fascial fibromatosis: Secondary | ICD-10-CM

## 2018-10-27 DIAGNOSIS — R5383 Other fatigue: Secondary | ICD-10-CM

## 2018-10-27 DIAGNOSIS — Z8639 Personal history of other endocrine, nutritional and metabolic disease: Secondary | ICD-10-CM

## 2018-10-27 DIAGNOSIS — M7711 Lateral epicondylitis, right elbow: Secondary | ICD-10-CM | POA: Diagnosis not present

## 2018-10-27 DIAGNOSIS — M7712 Lateral epicondylitis, left elbow: Secondary | ICD-10-CM

## 2018-10-27 DIAGNOSIS — M170 Bilateral primary osteoarthritis of knee: Secondary | ICD-10-CM

## 2018-10-27 DIAGNOSIS — Z8669 Personal history of other diseases of the nervous system and sense organs: Secondary | ICD-10-CM

## 2018-10-27 NOTE — Patient Instructions (Addendum)
Journal for Nurse Practitioners, 15(4), 263-267. Retrieved October 27, 2017 from [email or web address].com/[email or web address]">  Knee Exercises Ask your health care provider which exercises are safe for you. Do exercises exactly as told by your health care provider and adjust them as directed. It is normal to feel mild stretching, pulling, tightness, or discomfort as you do these exercises. Stop right away if you feel sudden pain or your pain gets worse. Do not begin these exercises until told by your health care provider. Stretching and range-of-motion exercises These exercises warm up your muscles and joints and improve the movement and flexibility of your knee. These exercises also help to relieve pain and swelling. Knee extension, prone 1. Lie on your abdomen (prone position) on a bed. 2. Place your left / right knee just beyond the edge of the surface so your knee is not on the bed. You can put a towel under your left / right thigh just above your kneecap for comfort. 3. Relax your leg muscles and allow gravity to straighten your knee (extension). You should feel a stretch behind your left / right knee. 4. Hold this position for __________ seconds. 5. Scoot up so your knee is supported between repetitions. Repeat __________ times. Complete this exercise __________ times a day. Knee flexion, active  1. Lie on your back with both legs straight. If this causes back discomfort, bend your left / right knee so your foot is flat on the floor. 2. Slowly slide your left / right heel back toward your buttocks. Stop when you feel a gentle stretch in the front of your knee or thigh (flexion). 3. Hold this position for __________ seconds. 4. Slowly slide your left / right heel back to the starting position. Repeat __________ times. Complete this exercise __________ times a day. Quadriceps stretch, prone  1. Lie on your abdomen on a firm surface, such as a bed or padded floor. 2. Bend your left / right knee and hold  your ankle. If you cannot reach your ankle or pant leg, loop a belt around your foot and grab the belt instead. 3. Gently pull your heel toward your buttocks. Your knee should not slide out to the side. You should feel a stretch in the front of your thigh and knee (quadriceps). 4. Hold this position for __________ seconds. Repeat __________ times. Complete this exercise __________ times a day. Hamstring, supine 1. Lie on your back (supine position). 2. Loop a belt or towel over the ball of your left / right foot. The ball of your foot is on the walking surface, right under your toes. 3. Straighten your left / right knee and slowly pull on the belt to raise your leg until you feel a gentle stretch behind your knee (hamstring). ? Do not let your knee bend while you do this. ? Keep your other leg flat on the floor. 4. Hold this position for __________ seconds. Repeat __________ times. Complete this exercise __________ times a day. Strengthening exercises These exercises build strength and endurance in your knee. Endurance is the ability to use your muscles for a long time, even after they get tired. Quadriceps, isometric This exercise stretches the muscles in front of your thigh (quadriceps) without moving your knee joint (isometric). 1. Lie on your back with your left / right leg extended and your other knee bent. Put a rolled towel or small pillow under your knee if told by your health care provider. 2. Slowly tense the muscles in the front of your left /   right thigh. You should see your kneecap slide up toward your hip or see increased dimpling just above the knee. This motion will push the back of the knee toward the floor. 3. For __________ seconds, hold the muscle as tight as you can without increasing your pain. 4. Relax the muscles slowly and completely. Repeat __________ times. Complete this exercise __________ times a day. Straight leg raises This exercise stretches the muscles in front  of your thigh (quadriceps) and the muscles that move your hips (hip flexors). 1. Lie on your back with your left / right leg extended and your other knee bent. 2. Tense the muscles in the front of your left / right thigh. You should see your kneecap slide up or see increased dimpling just above the knee. Your thigh may even shake a bit. 3. Keep these muscles tight as you raise your leg 4-6 inches (10-15 cm) off the floor. Do not let your knee bend. 4. Hold this position for __________ seconds. 5. Keep these muscles tense as you lower your leg. 6. Relax your muscles slowly and completely after each repetition. Repeat __________ times. Complete this exercise __________ times a day. Hamstring, isometric 1. Lie on your back on a firm surface. 2. Bend your left / right knee about __________ degrees. 3. Dig your left / right heel into the surface as if you are trying to pull it toward your buttocks. Tighten the muscles in the back of your thighs (hamstring) to "dig" as hard as you can without increasing any pain. 4. Hold this position for __________ seconds. 5. Release the tension gradually and allow your muscles to relax completely for __________ seconds after each repetition. Repeat __________ times. Complete this exercise __________ times a day. Hamstring curls If told by your health care provider, do this exercise while wearing ankle weights. Begin with __________ lb weights. Then increase the weight by 1 lb (0.5 kg) increments. Do not wear ankle weights that are more than __________ lb. 1. Lie on your abdomen with your legs straight. 2. Bend your left / right knee as far as you can without feeling pain. Keep your hips flat against the floor. 3. Hold this position for __________ seconds. 4. Slowly lower your leg to the starting position. Repeat __________ times. Complete this exercise __________ times a day. Squats This exercise strengthens the muscles in front of your thigh and knee  (quadriceps). 1. Stand in front of a table, with your feet and knees pointing straight ahead. You may rest your hands on the table for balance but not for support. 2. Slowly bend your knees and lower your hips like you are going to sit in a chair. ? Keep your weight over your heels, not over your toes. ? Keep your lower legs upright so they are parallel with the table legs. ? Do not let your hips go lower than your knees. ? Do not bend lower than told by your health care provider. ? If your knee pain increases, do not bend as low. 3. Hold the squat position for __________ seconds. 4. Slowly push with your legs to return to standing. Do not use your hands to pull yourself to standing. Repeat __________ times. Complete this exercise __________ times a day. Wall slides This exercise strengthens the muscles in front of your thigh and knee (quadriceps). 1. Lean your back against a smooth wall or door, and walk your feet out 18-24 inches (46-61 cm) from it. 2. Place your feet hip-width apart. 3.   Slowly slide down the wall or door until your knees bend __________ degrees. Keep your knees over your heels, not over your toes. Keep your knees in line with your hips. 4. Hold this position for __________ seconds. Repeat __________ times. Complete this exercise __________ times a day. Straight leg raises This exercise strengthens the muscles that rotate the leg at the hip and move it away from your body (hip abductors). 1. Lie on your side with your left / right leg in the top position. Lie so your head, shoulder, knee, and hip line up. You may bend your bottom knee to help you keep your balance. 2. Roll your hips slightly forward so your hips are stacked directly over each other and your left / right knee is facing forward. 3. Leading with your heel, lift your top leg 4-6 inches (10-15 cm). You should feel the muscles in your outer hip lifting. ? Do not let your foot drift forward. ? Do not let your knee  roll toward the ceiling. 4. Hold this position for __________ seconds. 5. Slowly return your leg to the starting position. 6. Let your muscles relax completely after each repetition. Repeat __________ times. Complete this exercise __________ times a day. Straight leg raises This exercise stretches the muscles that move your hips away from the front of the pelvis (hip extensors). 1. Lie on your abdomen on a firm surface. You can put a pillow under your hips if that is more comfortable. 2. Tense the muscles in your buttocks and lift your left / right leg about 4-6 inches (10-15 cm). Keep your knee straight as you lift your leg. 3. Hold this position for __________ seconds. 4. Slowly lower your leg to the starting position. 5. Let your leg relax completely after each repetition. Repeat __________ times. Complete this exercise __________ times a day. This information is not intended to replace advice given to you by your health care provider. Make sure you discuss any questions you have with your health care provider. Document Released: 11/21/2004 Document Revised: 10/28/2017 Document Reviewed: 10/28/2017 Elsevier Patient Education  2020 [employer] Ask your health care provider which exercises are safe for you. Do exercises exactly as told by your health care provider and adjust them as directed. It is normal to feel mild stretching, pulling, tightness, or discomfort as you do these exercises. Stop right away if you feel sudden pain or your pain gets worse. Do not begin these exercises until told by your health care provider. Stretching and range-of-motion exercises These exercises warm up your muscles and joints and improve the movement and flexibility of your elbow. These exercises also help to relieve pain, numbness, and tingling. Wrist flexion, assisted  1. Straighten your left / right elbow in front of you with your palm facing down toward the floor. ? If told by your  health care provider, bend your left / right elbow to a 90-degree angle (right angle) at your side. 2. With your other hand, gently push over the back of your left / right hand so your fingers point toward the floor (flexion). Stop when you feel a gentle stretch on the back of your forearm. 3. Hold this position for __________ seconds. Repeat __________ times. Complete this exercise __________ times a day. Wrist extension, assisted  1. Straighten your left / right elbow in front of you with your palm facing up toward the ceiling. ? If told by your health care provider, bend your left / right  elbow to a 90-degree angle (right angle) at your side. 2. With your other hand, gently pull your left / right hand and fingers toward the floor (extension). Stop when you feel a gentle stretch on the palm side of your forearm. 3. Hold this position for __________ seconds. Repeat __________ times. Complete this exercise __________ times a day. Assisted forearm rotation, supination 5. Sit or stand with your left / right elbow bent to a 90-degree angle (right angle) at your side. 6. Using your uninjured hand, turn (rotate) your left / right palm up toward the ceiling (supination) until you feel a gentle stretch along the inside of your forearm. 7. Hold this position for __________ seconds. Repeat __________ times. Complete this exercise __________ times a day. Assisted forearm rotation, pronation 1. Sit or stand with your left / right elbow bent to a 90-degree angle (right angle) at your side. 2. Using your uninjured hand, rotate your left / right palm down toward the floor (pronation) until you feel a gentle stretch along the outside of your forearm. 3. Hold this position for __________ seconds. Repeat __________ times. Complete this exercise __________ times a day. Strengthening exercises These exercises build strength and endurance in your forearm and elbow. Endurance is the ability to use your muscles for  a long time, even after they get tired. Radial deviation  1. Stand with a __________ weight or a hammer in your left / right hand. Or, sit while holding a rubber exercise band or tubing, with your left / right forearm supported on a table or countertop. ? If you are standing, position your forearm so that your thumb is facing forward. If you are sitting, position your forearm so that the thumb is facing the ceiling. This is the neutral position. 2. Raise your hand upward in front of you so your thumb moves toward the ceiling (radial deviation), or pull up on the rubber tubing. Keep your forearm and elbow still while you move your wrist only. 3. Hold this position for __________ seconds. 4. Slowly return to the starting position. Repeat __________ times. Complete this exercise __________ times a day. Wrist extension, eccentric 1. Sit with your left / right forearm palm-down and supported on a table or other surface. Let your left / right wrist extend over the edge of the surface. 2. Hold a __________ weight or a piece of exercise band or tubing in your left / right hand. ? If using a rubber exercise band or tubing, hold the other end of the tubing with your other hand. 3. Use your uninjured hand to move your left / right hand up toward the ceiling. 4. Take your uninjured hand away and slowly return to the starting position using only your left / right hand. Lowering your arm under tension is called eccentric extension. Repeat __________ times. Complete this exercise __________ times a day. Wrist extension Do not do this exercise if it causes pain at the outside of your elbow. Only do this exercise once instructed by your health care provider. 1. Sit with your left / right forearm supported on a table or other surface and your palm turned down toward the floor. Let your left / right wrist extend over the edge of the surface. 2. Hold a __________ weight or a piece of rubber exercise band or tubing.  ? If you are using a rubber exercise band or tubing, hold the band or tubing in place with your other hand to provide resistance. 3. Slowly bend your  wrist so your hand moves up toward the ceiling (extension). Move only your wrist, keeping your forearm and elbow still. 4. Hold this position for __________ seconds. 5. Slowly return to the starting position. Repeat __________ times. Complete this exercise __________ times a day. Forearm rotation, supination To do this exercise, you will need a lightweight hammer or rubber mallet. 1. Sit with your left / right forearm supported on a table or other surface. Bend your elbow to a 90-degree angle (right angle). Position your forearm so that your palm is facing down toward the floor, with your hand resting over the edge of the table. 2. Hold a hammer in your left / right hand. ? To make this exercise easier, hold the hammer near the head of the hammer. ? To make this exercise harder, hold the hammer near the end of the handle. 3. Without moving your wrist or elbow, slowly rotate your forearm so your palm faces up toward the ceiling (supination). 4. Hold this position for __________ seconds. 5. Slowly return to the starting position. Repeat __________ times. Complete this exercise __________ times a day. Shoulder blade squeeze 1. Sit in a stable chair or stand with good posture. If you are sitting down, do not let your back touch the back of the chair. 2. Your arms should be at your sides with your elbows bent to a 90-degree angle (right angle). Position your forearms so that your thumbs are facing the ceiling (neutral position). 3. Without lifting your shoulders up, squeeze your shoulder blades tightly together. 4. Hold this position for __________ seconds. 5. Slowly release and return to the starting position. Repeat __________ times. Complete this exercise __________ times a day. This information is not intended to replace advice given to you by  your health care provider. Make sure you discuss any questions you have with your health care provider. Document Released: 01/07/2005 Document Revised: 04/30/2018 Document Reviewed: 03/03/2018 Elsevier Patient Education  2020 [employer].

## 2018-12-11 ENCOUNTER — Other Ambulatory Visit: Payer: Self-pay

## 2018-12-11 ENCOUNTER — Ambulatory Visit (INDEPENDENT_AMBULATORY_CARE_PROVIDER_SITE_OTHER): Payer: BC Managed Care – PPO | Admitting: Podiatry

## 2018-12-11 ENCOUNTER — Encounter: Payer: Self-pay | Admitting: Podiatry

## 2018-12-11 ENCOUNTER — Ambulatory Visit (INDEPENDENT_AMBULATORY_CARE_PROVIDER_SITE_OTHER): Payer: BC Managed Care – PPO

## 2018-12-11 VITALS — BP 116/79 | HR 94

## 2018-12-11 DIAGNOSIS — M79671 Pain in right foot: Secondary | ICD-10-CM

## 2018-12-11 DIAGNOSIS — R202 Paresthesia of skin: Secondary | ICD-10-CM

## 2018-12-11 DIAGNOSIS — M722 Plantar fascial fibromatosis: Secondary | ICD-10-CM

## 2018-12-11 DIAGNOSIS — G5751 Tarsal tunnel syndrome, right lower limb: Secondary | ICD-10-CM

## 2018-12-11 DIAGNOSIS — R200 Anesthesia of skin: Secondary | ICD-10-CM

## 2018-12-15 ENCOUNTER — Encounter: Payer: Self-pay | Admitting: Podiatry

## 2018-12-15 NOTE — Progress Notes (Addendum)
Subjective:  Patient ID: Marcia Bowers, female    DOB: 14-Dec-1980,  MRN: [id number]  Chief Complaint  Patient presents with  . Foot Pain    right foot, arch painful since 1 year and has gotten within 2 weeks    38 y.o. female presents with the above complaint.  Patient presents with pain on the right arch of the foot.  Patient states that sharp shooting in nature.  Patient states that it is worse when walking barefooted.  Patient has a history of recently giving birth for 2 months ago.  Patient states this is gradually in onset and has been going on for about 4 months.  Patient states that shoes/light in sandals makes it feel better.  She denies any other acute complaints she denies any other seeing podiatrist.   Review of Systems: Negative except as noted in the HPI. Denies N/V/F/Ch.  Past Medical History:  Diagnosis Date  . Abnormal Pap smear   . Anemia   . BV (bacterial vaginosis)   . Chlamydia   . Fibroids   . Fibromyalgia   . Gonorrhea   . History of chicken pox   . Trichomonas   . Vaginal Pap smear, abnormal   . Yeast infection     Current Outpatient Medications:  .  cholecalciferol (VITAMIN D) 1000 UNITS tablet, Take 1,000 Units by mouth every morning., Disp: , Rfl:  .  cyclobenzaprine (FLEXERIL) 10 MG tablet, TAKE ONE TABLET BY MOUTH ONCE DAILY AT BEDTIME (Patient taking differently: as needed. ), Disp: 30 tablet, Rfl: 5 .  diphenhydramine-acetaminophen (TYLENOL PM) 25-500 MG TABS tablet, Take 1 tablet by mouth at bedtime as needed., Disp: , Rfl:  .  docusate sodium (COLACE) 100 MG capsule, Take 100 mg by mouth 2 (two) times daily as needed. For constipation, Disp: , Rfl:  .  ferrous sulfate 325 (65 FE) MG tablet, Take 325 mg by mouth 2 (two) times daily with a meal. , Disp: , Rfl:  .  ibuprofen (ADVIL,MOTRIN) 600 MG tablet, Take 1 tablet (600 mg total) by mouth every 6 (six) hours. (Patient taking differently: Take 600 mg by mouth as needed. ), Disp: 30 tablet, Rfl: 0  .  methocarbamol (ROBAXIN) 500 MG tablet, Take 1 tablet (500 mg total) by mouth 2 (two) times daily as needed for muscle spasms., Disp: 60 tablet, Rfl: 2 .  oxyCODONE-acetaminophen (PERCOCET/ROXICET) 5-325 MG tablet, Take 1 tablet by mouth every 6 (six) hours as needed (pain scale 4-7)., Disp: 15 tablet, Rfl: 0 .  Prenatal Vit-Fe Fumarate-FA (PRENATAL MULTIVITAMIN) TABS, Take 1 tablet by mouth every morning., Disp: , Rfl:   Social History   Tobacco Use  Smoking Status Never Smoker  Smokeless Tobacco Never Used    No Known Allergies Objective:   Vitals:   12/11/18 1625  BP: 116/79  Pulse: 94   There is no height or weight on file to calculate BMI. Constitutional Well developed. Well nourished.  Vascular Dorsalis pedis pulses palpable bilaterally. Posterior tibial pulses palpable bilaterally. Capillary refill normal to all digits.  No cyanosis or clubbing noted. Pedal hair growth normal.  Neurologic Normal speech. Oriented to person, place, and time. Epicritic sensation to light touch grossly present bilaterally.  Dermatologic Nails well groomed and normal in appearance. No open wounds. No skin lesions.  Orthopedic:  Positive Tinel's sign noted at the right tarsal tunnel which shooting into the medial and lateral branches.  No pain along the course of the posterior tibial tendon no pain along  the course of the peroneal tendon no pain along the course of Achilles tendon.   Radiographs: 3 views of skeletally mature adult foot.  There is a slight decrease in calcaneal inclination angle increase in talar declination consistent with pes planus deformity.  No arthritis or any other bony deformities noted at this time.  No soft tissue edema or swelling noted.  No soft tissue mass or bone mass noted. Assessment:   1. Tarsal tunnel syndrome, right   2. Pain in right foot   3. Numbness and tingling    Plan:  Patient was evaluated and treated and all questions answered.  Right tarsal  tunnel syndrome -Given the clinical examination with positive Tinel's sign upon percussion of the right tarsal tunnel region, I believe patient will benefit from a nerve block to help diagnose tarsal tunnel syndrome. -After obtaining consent, and per orders of Dr. Allena Katz, injection of 1% lidocaine plain and half percent Marcaine plain 3 cc given by Candelaria Stagers. Patient instructed to remain in clinic for 20 minutes afterwards, and to report any adverse reaction to me immediately. -I also believe that patient will benefit from Tri-Lock ankle brace to help hold the position of the ankle joint within alignment and therefore reducing the stress for the medial ankle and thereby the tarsal tunnel region. -If patient has significant improvement after undergoing nerve block I will consider getting an MRI to rule out tarsal tunnel or ruling tarsal tunnel.  No follow-ups on file.

## 2019-01-01 ENCOUNTER — Ambulatory Visit (INDEPENDENT_AMBULATORY_CARE_PROVIDER_SITE_OTHER): Payer: BC Managed Care – PPO | Admitting: Podiatry

## 2019-01-01 ENCOUNTER — Other Ambulatory Visit: Payer: Self-pay

## 2019-01-01 DIAGNOSIS — G5751 Tarsal tunnel syndrome, right lower limb: Secondary | ICD-10-CM

## 2019-01-01 DIAGNOSIS — M79671 Pain in right foot: Secondary | ICD-10-CM | POA: Diagnosis not present

## 2019-01-01 DIAGNOSIS — R200 Anesthesia of skin: Secondary | ICD-10-CM | POA: Diagnosis not present

## 2019-01-01 DIAGNOSIS — R202 Paresthesia of skin: Secondary | ICD-10-CM

## 2019-01-01 DIAGNOSIS — M2141 Flat foot [pes planus] (acquired), right foot: Secondary | ICD-10-CM

## 2019-01-01 DIAGNOSIS — M2142 Flat foot [pes planus] (acquired), left foot: Secondary | ICD-10-CM

## 2019-01-05 ENCOUNTER — Encounter: Payer: Self-pay | Admitting: Podiatry

## 2019-01-05 NOTE — Progress Notes (Signed)
Subjective:  Patient ID: Marcia Bowers, female    DOB: 01-07-1981,  MRN: [id number]  Chief Complaint  Patient presents with  . Foot Pain    pt is here for tarsal tunnel syndome, pt states that her foot is getting alot better since the last time she was here, injection she recieved last time has helped as well    38 y.o. female presents with the above complaint.  Patient is here for a follow-up of right tarsal tunnel syndrome.  Patient states she is doing a lot better.  She states the Tri-Lock brace has considerably helped.  The last time has also considerably been helping.  She denies any other acute complaints at this time.  She states overall she is 95% better.   Review of Systems: Negative except as noted in the HPI. Denies N/V/F/Ch.  Past Medical History:  Diagnosis Date  . Abnormal Pap smear   . Anemia   . BV (bacterial vaginosis)   . Chlamydia   . Fibroids   . Fibromyalgia   . Gonorrhea   . History of chicken pox   . Trichomonas   . Vaginal Pap smear, abnormal   . Yeast infection     Current Outpatient Medications:  .  cholecalciferol (VITAMIN D) 1000 UNITS tablet, Take 1,000 Units by mouth every morning., Disp: , Rfl:  .  cyclobenzaprine (FLEXERIL) 10 MG tablet, TAKE ONE TABLET BY MOUTH ONCE DAILY AT BEDTIME (Patient taking differently: as needed. ), Disp: 30 tablet, Rfl: 5 .  diphenhydramine-acetaminophen (TYLENOL PM) 25-500 MG TABS tablet, Take 1 tablet by mouth at bedtime as needed., Disp: , Rfl:  .  docusate sodium (COLACE) 100 MG capsule, Take 100 mg by mouth 2 (two) times daily as needed. For constipation, Disp: , Rfl:  .  ferrous sulfate 325 (65 FE) MG tablet, Take 325 mg by mouth 2 (two) times daily with a meal. , Disp: , Rfl:  .  ibuprofen (ADVIL,MOTRIN) 600 MG tablet, Take 1 tablet (600 mg total) by mouth every 6 (six) hours. (Patient taking differently: Take 600 mg by mouth as needed. ), Disp: 30 tablet, Rfl: 0 .  methocarbamol (ROBAXIN) 500 MG tablet, Take  1 tablet (500 mg total) by mouth 2 (two) times daily as needed for muscle spasms., Disp: 60 tablet, Rfl: 2 .  oxyCODONE-acetaminophen (PERCOCET/ROXICET) 5-325 MG tablet, Take 1 tablet by mouth every 6 (six) hours as needed (pain scale 4-7)., Disp: 15 tablet, Rfl: 0 .  Prenatal Vit-Fe Fumarate-FA (PRENATAL MULTIVITAMIN) TABS, Take 1 tablet by mouth every morning., Disp: , Rfl:   Social History   Tobacco Use  Smoking Status Never Smoker  Smokeless Tobacco Never Used    No Known Allergies Objective:   There were no vitals filed for this visit. There is no height or weight on file to calculate BMI. Constitutional Well developed. Well nourished.  Vascular Dorsalis pedis pulses palpable bilaterally. Posterior tibial pulses palpable bilaterally. Capillary refill normal to all digits.  No cyanosis or clubbing noted. Pedal hair growth normal.  Neurologic Normal speech. Oriented to person, place, and time. Epicritic sensation to light touch grossly present bilaterally.  Dermatologic Nails well groomed and normal in appearance. No open wounds. No skin lesions.  Orthopedic:  Mild pain with percussion via positive Tinel's sign noted at the right tarsal tunnel which shooting into the medial and lateral branches.  No pain along the course of the posterior tibial tendon no pain along the course of the peroneal tendon no pain  along the course of Achilles tendon.   Radiographs: None Assessment:   1. Tarsal tunnel syndrome, right   2. Pain in right foot   3. Numbness and tingling    Plan:  Patient was evaluated and treated and all questions answered.  Right tarsal tunnel syndrome -Given that she had considerable improvement in her symptoms of right tarsal tunnel syndrome I believe that patient does not need another injection as her pain has improved greater than 95%. -She will continue use a Tri-Lock brace to help hold her foot in a good position. -I also discussed with the patient that she  will benefit from custom-made orthotics as I would like her to start transitioning away from Tri-Lock brace.    Pes planus semiflexible -I explained to the patient the etiology of pes planus deformity and its underlying role and tarsal tunnel syndrome.  Given that she has pes planus deformity her pain may likely return if she is not in a good custom-made orthotics to help support the arch of her foot and control the hindfoot motion. -Patient agrees with the plan and she will obtain orthotics and is scheduled to see rick  No follow-ups on file.

## 2019-01-11 ENCOUNTER — Other Ambulatory Visit: Payer: Self-pay

## 2019-01-11 ENCOUNTER — Ambulatory Visit (INDEPENDENT_AMBULATORY_CARE_PROVIDER_SITE_OTHER): Payer: BC Managed Care – PPO | Admitting: Orthotics

## 2019-01-11 DIAGNOSIS — G5751 Tarsal tunnel syndrome, right lower limb: Secondary | ICD-10-CM

## 2019-01-11 DIAGNOSIS — G5752 Tarsal tunnel syndrome, left lower limb: Secondary | ICD-10-CM | POA: Diagnosis not present

## 2019-01-11 DIAGNOSIS — R202 Paresthesia of skin: Secondary | ICD-10-CM

## 2019-01-11 DIAGNOSIS — R200 Anesthesia of skin: Secondary | ICD-10-CM

## 2019-01-11 DIAGNOSIS — M2141 Flat foot [pes planus] (acquired), right foot: Secondary | ICD-10-CM

## 2019-01-11 DIAGNOSIS — M79671 Pain in right foot: Secondary | ICD-10-CM

## 2019-01-11 NOTE — Progress Notes (Signed)
Patient came into today for casting bilateral f/o to address tarsal tunnel syndrome/pes plannus Poal is to provide longitudinal arch support and correct any RF instability due to heel eversion/inversion.  Ultimate goal is to relieve tension at pf insertion calcaneal tuberosity.  Plan on semi-rigid device addressing heel stability and relieving PF tension.

## 2019-02-09 ENCOUNTER — Other Ambulatory Visit: Payer: Self-pay

## 2019-02-09 ENCOUNTER — Ambulatory Visit: Payer: BC Managed Care – PPO | Admitting: Orthotics

## 2019-02-09 DIAGNOSIS — G5751 Tarsal tunnel syndrome, right lower limb: Secondary | ICD-10-CM

## 2019-02-09 DIAGNOSIS — M2141 Flat foot [pes planus] (acquired), right foot: Secondary | ICD-10-CM

## 2019-02-09 DIAGNOSIS — R200 Anesthesia of skin: Secondary | ICD-10-CM

## 2019-02-09 DIAGNOSIS — M79671 Pain in right foot: Secondary | ICD-10-CM

## 2019-02-09 NOTE — Progress Notes (Signed)
Patient came in today to pick up custom made foot orthotics.  The goals were accomplished and the patient reported no dissatisfaction with said orthotics.  Patient was advised of breakin period and how to report any issues. 

## 2019-04-26 NOTE — Progress Notes (Deleted)
Office Visit Note  Patient: Marcia Bowers             Date of Birth: Aug 26, 1980           MRN: [id number]             PCP: Lucila Maine Referring: Lucila Maine Visit Date: 04/28/2019 Occupation: @GUAROCC @  Subjective:  No chief complaint on file.   History of Present Illness: Marcia Bowers is a 39 y.o. female ***   Activities of Daily Living:  Patient reports morning stiffness for *** {minute/hour:19697}.   Patient {ACTIONS;DENIES/REPORTS:21021675::"Denies"} nocturnal pain.  Difficulty dressing/grooming: {ACTIONS;DENIES/REPORTS:21021675::"Denies"} Difficulty climbing stairs: {ACTIONS;DENIES/REPORTS:21021675::"Denies"} Difficulty getting out of chair: {ACTIONS;DENIES/REPORTS:21021675::"Denies"} Difficulty using hands for taps, buttons, cutlery, and/or writing: {ACTIONS;DENIES/REPORTS:21021675::"Denies"}  No Rheumatology ROS completed.   PMFS History:  Patient Active Problem List   Diagnosis Date Noted  . Normal postpartum course 09/22/2017  . PROM (premature rupture of membranes) 09/18/2017  . Primary osteoarthritis of both knees 11/05/2016  . Myofascial pain syndrome 03/19/2016  . Other chronic pain 03/19/2016  . Other fatigue 03/19/2016  . Myalgia 03/19/2016  . Pain of both hip joints 03/19/2016  . Arthritis, senescent 03/19/2016  . Arthralgia of both knees 03/19/2016  . History of vitamin D deficiency 03/19/2016  . BV (bacterial vaginosis)   . Yeast infection   . Trichomonas   . Chlamydia   . Gonorrhea   . Fibroids   . Umbilical hernia 01/24/2011  . UNSPECIFIED VITAMIN D DEFICIENCY 06/03/2008  . ABNORMAL PAP SMEAR, LGSIL 08/21/2007  . BACK PAIN, LUMBAR 07/30/2007  . BACK PAIN, UPPER 07/30/2007  . MIGRAINE HEADACHE 07/02/2007  . DENTAL CARIES 07/02/2007  . LEG PAIN, CHRONIC 07/02/2007    Past Medical History:  Diagnosis Date  . Abnormal Pap smear   . Anemia   . BV (bacterial vaginosis)   . Chlamydia   . Fibroids   .  Fibromyalgia   . Gonorrhea   . History of chicken pox   . Trichomonas   . Vaginal Pap smear, abnormal   . Yeast infection     Family History  Problem Relation Age of Onset  . Thyroid disease Mother   . Hypertension Father   . Heart disease Other        Heart on the right  . Heart disease Paternal Grandfather   . Hypertension Paternal Grandfather   . Diabetes Paternal Grandfather   . Heart disease Paternal Grandmother   . Hypertension Paternal Grandmother   . Healthy Sister   . Healthy Brother   . Healthy Daughter   . Healthy Daughter   . Anesthesia problems Neg Hx    Past Surgical History:  Procedure Laterality Date  . CESAREAN SECTION  04/13/2011   Procedure: CESAREAN SECTION;  Surgeon: Purcell Nails, MD;  Location: [facility];  Service: Gynecology;  Laterality: N/A;  Primary cesarean section with delivery of baby girl at 90. Apgars 8/9.  Marland Kitchen CESAREAN SECTION N/A 09/19/2017   Procedure: CESAREAN SECTION;  Surgeon: Essie Hart, MD;  Location: [facility];  Service: Obstetrics;  Laterality: N/A;  . INSERTION OF MESH N/A 10/24/2017   Procedure: INSERTION OF MESH;  Surgeon: Griselda Miner, MD;  Location: [facility];  Service: General;  Laterality: N/A;  . TUBAL LIGATION Bilateral 09/19/2017   Procedure: BILATERAL TUBAL LIGATION;  Surgeon: Essie Hart, MD;  Location: [facility];  Service: Obstetrics;  Laterality: Bilateral;  . UMBILICAL HERNIA REPAIR N/A 10/24/2017  Procedure: UMBILICAL HERNIA REPAIR ERAS PATHWAY;  Surgeon: Griselda Miner, MD;  Location: [facility];  Service: General;  Laterality: N/A;  . WISDOM TOOTH EXTRACTION     Social History   Social History Narrative  . Not on file   Immunization History  Administered Date(s) Administered  . Td 07/30/2007  . Tdap 04/16/2011     Objective: Vital Signs: There were no vitals taken for this visit.   Physical Exam   Musculoskeletal Exam: ***  CDAI Exam: CDAI Score:  -- Patient Global: --; Provider Global: -- Swollen: --; Tender: -- Joint Exam 04/28/2019   No joint exam has been documented for this visit   There is currently no information documented on the homunculus. Go to the Rheumatology activity and complete the homunculus joint exam.  Investigation: No additional findings.  Imaging: No results found.  Recent Labs: Lab Results  Component Value Date   WBC 26.4 (H) 09/19/2017   HGB 10.3 (L) 09/19/2017   PLT 240 09/19/2017   NA 138 03/21/2016   K 4.0 03/21/2016   CL 104 03/21/2016   CO2 27 03/21/2016   GLUCOSE 81 03/21/2016   BUN 10 03/21/2016   CREATININE 0.84 03/21/2016   BILITOT 0.9 03/21/2016   ALKPHOS 39 03/21/2016   AST 18 03/21/2016   ALT 12 03/21/2016   PROT 6.9 03/21/2016   ALBUMIN 3.9 03/21/2016   CALCIUM 9.1 03/21/2016   GFRAA >89 03/21/2016    Speciality Comments: No specialty comments available.  Procedures:  No procedures performed Allergies: Patient has no known allergies.   Assessment / Plan:     Visit Diagnoses: No diagnosis found.  Orders: No orders of the defined types were placed in this encounter.  No orders of the defined types were placed in this encounter.   Face-to-face time spent with patient was *** minutes. Greater than 50% of time was spent in counseling and coordination of care.  Follow-Up Instructions: No follow-ups on file.   Ellen Henri, CMA  Note - This record has been created using Animal nutritionist.  Chart creation errors have been sought, but may not always  have been located. Such creation errors do not reflect on  the standard of medical care.

## 2019-04-28 ENCOUNTER — Ambulatory Visit: Payer: BC Managed Care – PPO | Admitting: Physician Assistant

## 2019-05-04 NOTE — Progress Notes (Signed)
Office Visit Note  Patient: Marcia Bowers             Date of Birth: 10/17/1980           MRN: [id number]             PCP: Lucila Maine Referring: Teena Irani, PA-C Visit Date: 05/13/2019 Occupation: @GUAROCC @  Subjective:  Routine follow-up  History of Present Illness: Marcia Bowers is a 39 y.o. female with history of myofascial pain syndrome and osteoarthritis.  Patient states that she has not had any recent myofascial pain flares recently.  She denies any muscle tenderness or myalgias at this time.  She states that she has not needed to take Robaxin or Flexeril recently.  She says she continues to have chronic fatigue but has been sleeping better at night.  She takes Tylenol PM as needed.  She has occasional arthralgias but denies any joint swelling.  She denies any discomfort in her knee joints at this time.  She states that her plantar fasciitis has improved since being fitted for orthotics in January 2021.  She plans on following up with her podiatrist next month.  She states that she was previously experiencing some paresthesias in both feet intermittently at night but states that her symptoms have resolved.  Activities of Daily Living:  Patient reports morning stiffness for 30 minutes.   Patient Reports nocturnal pain.  Difficulty dressing/grooming: Reports Difficulty climbing stairs: Reports Difficulty getting out of chair: Denies Difficulty using hands for taps, buttons, cutlery, and/or writing: Reports  Review of Systems  Constitutional: Positive for fatigue.  HENT: Negative for mouth sores, mouth dryness and nose dryness.   Eyes: Positive for dryness. Negative for pain and visual disturbance.  Respiratory: Negative for cough, hemoptysis, shortness of breath and difficulty breathing.   Cardiovascular: Negative for chest pain, palpitations, hypertension and swelling in legs/feet.  Gastrointestinal: Positive for constipation. Negative for blood in stool  and diarrhea.  Endocrine: Negative for increased urination.  Genitourinary: Negative for difficulty urinating and painful urination.  Musculoskeletal: Positive for arthralgias, joint pain, morning stiffness and muscle tenderness. Negative for joint swelling, myalgias, muscle weakness and myalgias.  Skin: Negative for color change, pallor, rash, hair loss, nodules/bumps, skin tightness, ulcers and sensitivity to sunlight.  Allergic/Immunologic: Negative for susceptible to infections.  Neurological: Negative for dizziness, numbness, headaches and weakness.  Hematological: Negative for bruising/bleeding tendency and swollen glands.  Psychiatric/Behavioral: Positive for sleep disturbance. Negative for depressed mood. The patient is not nervous/anxious.     PMFS History:  Patient Active Problem List   Diagnosis Date Noted  . Normal postpartum course 09/22/2017  . PROM (premature rupture of membranes) 09/18/2017  . Primary osteoarthritis of both knees 11/05/2016  . Myofascial pain syndrome 03/19/2016  . Other chronic pain 03/19/2016  . Other fatigue 03/19/2016  . Myalgia 03/19/2016  . Pain of both hip joints 03/19/2016  . Arthritis, senescent 03/19/2016  . Arthralgia of both knees 03/19/2016  . History of vitamin D deficiency 03/19/2016  . BV (bacterial vaginosis)   . Yeast infection   . Trichomonas   . Chlamydia   . Gonorrhea   . Fibroids   . Umbilical hernia 01/24/2011  . UNSPECIFIED VITAMIN D DEFICIENCY 06/03/2008  . ABNORMAL PAP SMEAR, LGSIL 08/21/2007  . BACK PAIN, LUMBAR 07/30/2007  . BACK PAIN, UPPER 07/30/2007  . MIGRAINE HEADACHE 07/02/2007  . DENTAL CARIES 07/02/2007  . LEG PAIN, CHRONIC 07/02/2007    Past Medical History:  Diagnosis Date  . Abnormal Pap smear   . Anemia   . Bursitis   . BV (bacterial vaginosis)   . Chlamydia   . Fibroids   . Fibromyalgia   . Gonorrhea   . History of chicken pox   . Tendonitis   . Trichomonas   . Vaginal Pap smear, abnormal     . Yeast infection     Family History  Problem Relation Age of Onset  . Thyroid disease Mother   . Hypertension Father   . Heart disease Other        Heart on the right  . Heart disease Paternal Grandfather   . Hypertension Paternal Grandfather   . Diabetes Paternal Grandfather   . Heart disease Paternal Grandmother   . Hypertension Paternal Grandmother   . Healthy Sister   . Healthy Brother   . Healthy Daughter   . Healthy Daughter   . Anesthesia problems Neg Hx    Past Surgical History:  Procedure Laterality Date  . CESAREAN SECTION  04/13/2011   Procedure: CESAREAN SECTION;  Surgeon: Purcell Nails, MD;  Location: [facility];  Service: Gynecology;  Laterality: N/A;  Primary cesarean section with delivery of baby girl at 75. Apgars 8/9.  Marland Kitchen CESAREAN SECTION N/A 09/19/2017   Procedure: CESAREAN SECTION;  Surgeon: Essie Hart, MD;  Location: [facility];  Service: Obstetrics;  Laterality: N/A;  . INSERTION OF MESH N/A 10/24/2017   Procedure: INSERTION OF MESH;  Surgeon: Griselda Miner, MD;  Location: [facility];  Service: General;  Laterality: N/A;  . TUBAL LIGATION Bilateral 09/19/2017   Procedure: BILATERAL TUBAL LIGATION;  Surgeon: Essie Hart, MD;  Location: [facility];  Service: Obstetrics;  Laterality: Bilateral;  . UMBILICAL HERNIA REPAIR N/A 10/24/2017   Procedure: UMBILICAL HERNIA REPAIR ERAS PATHWAY;  Surgeon: Griselda Miner, MD;  Location: [facility];  Service: General;  Laterality: N/A;  . WISDOM TOOTH EXTRACTION     Social History   Social History Narrative  . Not on file   Immunization History  Administered Date(s) Administered  . Td 07/30/2007  . Tdap 04/16/2011     Objective: Vital Signs: BP 114/66 (BP Location: Left Arm, Patient Position: Sitting, Cuff Size: Normal)   Pulse 77   Resp 14   Ht 5\' 3"  (1.6 m)   Wt 135 lb 12.8 oz (61.6 kg)   BMI 24.06 kg/m    Physical Exam Vitals and nursing note reviewed.   Constitutional:      Appearance: She is well-developed.  HENT:     Head: Normocephalic and atraumatic.  Eyes:     Conjunctiva/sclera: Conjunctivae normal.  Pulmonary:     Effort: Pulmonary effort is normal.  Abdominal:     General: Bowel sounds are normal.     Palpations: Abdomen is soft.  Musculoskeletal:     Cervical back: Normal range of motion.  Lymphadenopathy:     Cervical: No cervical adenopathy.  Skin:    General: Skin is warm and dry.     Capillary Refill: Capillary refill takes less than 2 seconds.  Neurological:     Mental Status: She is alert and oriented to person, place, and time.  Psychiatric:        Behavior: Behavior normal.      Musculoskeletal Exam: C-spine, thoracic spine, lumbar spine good range of motion.  Shoulder joints, elbow joints, wrist joints, MCPs, PIPs and DIPs good range of motion with no synovitis.  No  tenderness over the lateral epicondyles of both elbows.  She has complete fist formation bilaterally.  Hip joints, knee joints and ankle joints, MTPs, PIPs, DIPs good range of motion with no synovitis.  No warmth or effusion of bilateral knee joints.  No tenderness or swelling of ankle joints.  CDAI Exam: CDAI Score: -- Patient Global: --; Provider Global: -- Swollen: --; Tender: -- Joint Exam 05/13/2019   No joint exam has been documented for this visit   There is currently no information documented on the homunculus. Go to the Rheumatology activity and complete the homunculus joint exam.  Investigation: No additional findings.  Imaging: No results found.  Recent Labs: Lab Results  Component Value Date   WBC 26.4 (H) 09/19/2017   HGB 10.3 (L) 09/19/2017   PLT 240 09/19/2017   NA 138 03/21/2016   K 4.0 03/21/2016   CL 104 03/21/2016   CO2 27 03/21/2016   GLUCOSE 81 03/21/2016   BUN 10 03/21/2016   CREATININE 0.84 03/21/2016   BILITOT 0.9 03/21/2016   ALKPHOS 39 03/21/2016   AST 18 03/21/2016   ALT 12 03/21/2016   PROT 6.9  03/21/2016   ALBUMIN 3.9 03/21/2016   CALCIUM 9.1 03/21/2016   GFRAA >89 03/21/2016    Speciality Comments: No specialty comments available.  Procedures:  No procedures performed Allergies: Patient has no known allergies.   Assessment / Plan:     Visit Diagnoses: Myofascial pain syndrome: She has not had any recent flares.  She is on experiencing any myalgias or muscle tenderness at this time.  She has not needed to take Flexeril or Robaxin recently.  She does not need any refills at this time.  She continues to have chronic fatigue but has been sleeping better at night.  She takes Tylenol PM as needed for pain relief and insomnia but tries to take it sparingly.  We discussed the importance of regular exercise and good sleep hygiene.  She plans on starting yoga.  She experiences intermittent trochanteric bursitis bilaterally.  She was encouraged to stretch on a daily basis.  She will follow-up in the office in 6 months.  Other fatigue: Chronic.  We discussed the importance of regular exercise.  Other chronic pain: She takes Percocet as needed for pain relief.  Lateral epicondylitis of both elbows: Resolved.  She has no tenderness to palpation on exam today.  Primary osteoarthritis of both knees: She has good range of motion of both knee joints on exam.  No warmth or effusion was noted.  Her knee joint pain has improved.  Plantar fasciitis, bilateral: Resolved.  She was fitted for orthotics by her podiatrist in January which has improved her symptoms.  She will be following up with her podiatrist in May 2021.  History of vitamin D deficiency: She is taking vitamin D 1000 units by mouth daily.  History of migraine  Orders: No orders of the defined types were placed in this encounter.  No orders of the defined types were placed in this encounter.   Follow-Up Instructions: Return in about 6 months (around 11/12/2019) for myofascial pain syndrome .   Gearldine Bienenstock, PA-C  Note -  This record has been created using Dragon software.  Chart creation errors have been sought, but may not always  have been located. Such creation errors do not reflect on  the standard of medical care.

## 2019-05-13 ENCOUNTER — Encounter: Payer: Self-pay | Admitting: Physician Assistant

## 2019-05-13 ENCOUNTER — Ambulatory Visit: Payer: BC Managed Care – PPO | Admitting: Physician Assistant

## 2019-05-13 ENCOUNTER — Other Ambulatory Visit: Payer: Self-pay

## 2019-05-13 VITALS — BP 114/66 | HR 77 | Resp 14 | Ht 63.0 in | Wt 135.8 lb

## 2019-05-13 DIAGNOSIS — Z8669 Personal history of other diseases of the nervous system and sense organs: Secondary | ICD-10-CM

## 2019-05-13 DIAGNOSIS — M170 Bilateral primary osteoarthritis of knee: Secondary | ICD-10-CM

## 2019-05-13 DIAGNOSIS — M722 Plantar fascial fibromatosis: Secondary | ICD-10-CM

## 2019-05-13 DIAGNOSIS — Z8639 Personal history of other endocrine, nutritional and metabolic disease: Secondary | ICD-10-CM

## 2019-05-13 DIAGNOSIS — M7711 Lateral epicondylitis, right elbow: Secondary | ICD-10-CM

## 2019-05-13 DIAGNOSIS — M7918 Myalgia, other site: Secondary | ICD-10-CM

## 2019-05-13 DIAGNOSIS — G8929 Other chronic pain: Secondary | ICD-10-CM

## 2019-05-13 DIAGNOSIS — R5383 Other fatigue: Secondary | ICD-10-CM | POA: Diagnosis not present

## 2019-05-13 DIAGNOSIS — M7712 Lateral epicondylitis, left elbow: Secondary | ICD-10-CM

## 2019-10-22 ENCOUNTER — Other Ambulatory Visit: Payer: Self-pay

## 2019-10-22 ENCOUNTER — Encounter: Payer: Self-pay | Admitting: Podiatry

## 2019-10-22 ENCOUNTER — Ambulatory Visit (INDEPENDENT_AMBULATORY_CARE_PROVIDER_SITE_OTHER): Payer: BC Managed Care – PPO

## 2019-10-22 ENCOUNTER — Ambulatory Visit (INDEPENDENT_AMBULATORY_CARE_PROVIDER_SITE_OTHER): Payer: BC Managed Care – PPO | Admitting: Podiatry

## 2019-10-22 DIAGNOSIS — M76821 Posterior tibial tendinitis, right leg: Secondary | ICD-10-CM | POA: Diagnosis not present

## 2019-10-22 DIAGNOSIS — G5751 Tarsal tunnel syndrome, right lower limb: Secondary | ICD-10-CM

## 2019-10-22 NOTE — Progress Notes (Signed)
Subjective:  Patient ID: Marcia Bowers, female    DOB: 08-28-1980,  MRN: [id number]  Chief Complaint  Patient presents with  . Follow-up    39 y.o. female presents with the above complaint.  Patient presents 10 months follow-up from a right tarsal tunnel syndrome.  Patient states she was doing really good however her pain has returned.  She would like to discuss treatment options.  She states that the pain came out of nowhere.  She was wearing orthotics with good sneakers which does tend to help overall but her pain has been uncontrolled.  She denies any other acute complaints.  She would like to discuss treatment options.   Review of Systems: Negative except as noted in the HPI. Denies N/V/F/Ch.  Past Medical History:  Diagnosis Date  . Abnormal Pap smear   . Anemia   . Bursitis   . BV (bacterial vaginosis)   . Chlamydia   . Fibroids   . Fibromyalgia   . Gonorrhea   . History of chicken pox   . Tendonitis   . Trichomonas   . Vaginal Pap smear, abnormal   . Yeast infection     Current Outpatient Medications:  .  cholecalciferol (VITAMIN D) 1000 UNITS tablet, Take 1,000 Units by mouth every morning., Disp: , Rfl:  .  cyclobenzaprine (FLEXERIL) 10 MG tablet, TAKE ONE TABLET BY MOUTH ONCE DAILY AT BEDTIME (Patient taking differently: as needed. ), Disp: 30 tablet, Rfl: 5 .  diphenhydramine-acetaminophen (TYLENOL PM) 25-500 MG TABS tablet, Take 1 tablet by mouth at bedtime as needed., Disp: , Rfl:  .  docusate sodium (COLACE) 100 MG capsule, Take 100 mg by mouth 2 (two) times daily as needed. For constipation, Disp: , Rfl:  .  ferrous sulfate 325 (65 FE) MG tablet, Take 325 mg by mouth 2 (two) times daily with a meal. , Disp: , Rfl:  .  ibuprofen (ADVIL,MOTRIN) 600 MG tablet, Take 1 tablet (600 mg total) by mouth every 6 (six) hours. (Patient taking differently: Take 600 mg by mouth as needed. ), Disp: 30 tablet, Rfl: 0 .  methocarbamol (ROBAXIN) 500 MG tablet, Take 1 tablet  (500 mg total) by mouth 2 (two) times daily as needed for muscle spasms., Disp: 60 tablet, Rfl: 2 .  oxyCODONE-acetaminophen (PERCOCET/ROXICET) 5-325 MG tablet, Take 1 tablet by mouth every 6 (six) hours as needed (pain scale 4-7)., Disp: 15 tablet, Rfl: 0 .  Prenatal Vit-Fe Fumarate-FA (PRENATAL MULTIVITAMIN) TABS, Take 1 tablet by mouth every morning., Disp: , Rfl:   Social History   Tobacco Use  Smoking Status Never Smoker  Smokeless Tobacco Never Used    No Known Allergies Objective:   There were no vitals filed for this visit. There is no height or weight on file to calculate BMI. Constitutional Well developed. Well nourished.  Vascular Dorsalis pedis pulses palpable bilaterally. Posterior tibial pulses palpable bilaterally. Capillary refill normal to all digits.  No cyanosis or clubbing noted. Pedal hair growth normal.  Neurologic Normal speech. Oriented to person, place, and time. Epicritic sensation to light touch grossly present bilaterally.  Dermatologic Nails well groomed and normal in appearance. No open wounds. No skin lesions.  Orthopedic:  Mild pain with percussion via positive Tinel's sign noted at the right tarsal tunnel which shooting into the medial and lateral branches.   no pain along the course of the peroneal tendon no pain along the course of Achilles tendon.  Pain along the course of the posterior tibial tendon.  No pain at the insertion.   Radiographs: 3 views of skeletally mature adult right foot: There is decreasing cocaine inclination angle increase in talar declination angle anterior break in the cyma line.  Mild first ray elevatus noted.  Mild accessory navicular identified.  No other bony abnormalities noted.  Assessment:   1. Tarsal tunnel syndrome, right   2. Posterior tibial tendinitis, right    Plan:  Patient was evaluated and treated and all questions answered.  Right tarsal tunnel syndrome versus posterior tibial tendinitis -It appears  that patient's pain has recurred and therefore I believe she will benefit from cam boot immobilization.  I also believe given the her pain has returned to it she will benefit from an MRI evaluation.  Patient agrees with the plan would like to work on obtaining MRI to rule out tendon tear of the posterior tibial. -Cam boot was dispensed  Pes planus semiflexible -I explained to the patient the etiology of pes planus deformity and its underlying role and tarsal tunnel syndrome.  Given that she has pes planus deformity her pain may likely return if she is not in a good custom-made orthotics to help support the arch of her foot and control the hindfoot motion. -Patient has been ambulating with orthotics and has been functioning well.  No follow-ups on file.

## 2019-10-25 ENCOUNTER — Telehealth: Payer: Self-pay

## 2019-10-25 NOTE — Telephone Encounter (Signed)
I spoke with the pt today about getting a different boot. I spoke with Dr. Posey Pronto and he stated that we could write the pt a note to be either out of work or to allow the pt to be able to wear the boot. Pt stated what she would speak with her boss.

## 2019-10-27 ENCOUNTER — Ambulatory Visit: Payer: BC Managed Care – PPO | Admitting: Podiatry

## 2019-10-29 NOTE — Progress Notes (Deleted)
Office Visit Note  Patient: Marcia Bowers             Date of Birth: 21-Mar-1980           MRN: [id number]             PCP: Lucila Maine Referring: Lucila Maine Visit Date: 11/12/2019 Occupation: @GUAROCC @  Subjective:  No chief complaint on file.   History of Present Illness: VERENISE Bowers is a 39 y.o. female ***   Activities of Daily Living:  Patient reports morning stiffness for *** {minute/hour:19697}.   Patient {ACTIONS;DENIES/REPORTS:21021675::"Denies"} nocturnal pain.  Difficulty dressing/grooming: {ACTIONS;DENIES/REPORTS:21021675::"Denies"} Difficulty climbing stairs: {ACTIONS;DENIES/REPORTS:21021675::"Denies"} Difficulty getting out of chair: {ACTIONS;DENIES/REPORTS:21021675::"Denies"} Difficulty using hands for taps, buttons, cutlery, and/or writing: {ACTIONS;DENIES/REPORTS:21021675::"Denies"}  No Rheumatology ROS completed.   PMFS History:  Patient Active Problem List   Diagnosis Date Noted  . Normal postpartum course 09/22/2017  . PROM (premature rupture of membranes) 09/18/2017  . Primary osteoarthritis of both knees 11/05/2016  . Myofascial pain syndrome 03/19/2016  . Other chronic pain 03/19/2016  . Other fatigue 03/19/2016  . Myalgia 03/19/2016  . Pain of both hip joints 03/19/2016  . Arthritis, senescent 03/19/2016  . Arthralgia of both knees 03/19/2016  . History of vitamin D deficiency 03/19/2016  . BV (bacterial vaginosis)   . Yeast infection   . Trichomonas   . Chlamydia   . Gonorrhea   . Fibroids   . Umbilical hernia 01/24/2011  . UNSPECIFIED VITAMIN D DEFICIENCY 06/03/2008  . ABNORMAL PAP SMEAR, LGSIL 08/21/2007  . BACK PAIN, LUMBAR 07/30/2007  . BACK PAIN, UPPER 07/30/2007  . MIGRAINE HEADACHE 07/02/2007  . DENTAL CARIES 07/02/2007  . LEG PAIN, CHRONIC 07/02/2007    Past Medical History:  Diagnosis Date  . Abnormal Pap smear   . Anemia   . Bursitis   . BV (bacterial vaginosis)   . Chlamydia   . Fibroids    . Fibromyalgia   . Gonorrhea   . History of chicken pox   . Tendonitis   . Trichomonas   . Vaginal Pap smear, abnormal   . Yeast infection     Family History  Problem Relation Age of Onset  . Thyroid disease Mother   . Hypertension Father   . Heart disease Other        Heart on the right  . Heart disease Paternal Grandfather   . Hypertension Paternal Grandfather   . Diabetes Paternal Grandfather   . Heart disease Paternal Grandmother   . Hypertension Paternal Grandmother   . Healthy Sister   . Healthy Brother   . Healthy Daughter   . Healthy Daughter   . Anesthesia problems Neg Hx    Past Surgical History:  Procedure Laterality Date  . CESAREAN SECTION  04/13/2011   Procedure: CESAREAN SECTION;  Surgeon: Purcell Nails, MD;  Location: [facility];  Service: Gynecology;  Laterality: N/A;  Primary cesarean section with delivery of baby girl at 33. Apgars 8/9.  Marland Kitchen CESAREAN SECTION N/A 09/19/2017   Procedure: CESAREAN SECTION;  Surgeon: Essie Hart, MD;  Location: [facility]TH[facility];  Service: Obstetrics;  Laterality: N/A;  . INSERTION OF MESH N/A 10/24/2017   Procedure: INSERTION OF MESH;  Surgeon: Griselda Miner, MD;  Location: [facility];  Service: General;  Laterality: N/A;  . TUBAL LIGATION Bilateral 09/19/2017   Procedure: BILATERAL TUBAL LIGATION;  Surgeon: Essie Hart, MD;  Location: [facility]THING SUIT[facility];  Service: Obstetrics;  Laterality: Bilateral;  .  UMBILICAL HERNIA REPAIR N/A 10/24/2017   Procedure: UMBILICAL HERNIA REPAIR ERAS PATHWAY;  Surgeon: Griselda Miner, MD;  Location: [facility];  Service: General;  Laterality: N/A;  . WISDOM TOOTH EXTRACTION     Social History   Social History Narrative  . Not on file   Immunization History  Administered Date(s) Administered  . Td 07/30/2007  . Tdap 04/16/2011     Objective: Vital Signs: There were no vitals taken for this visit.   Physical Exam   Musculoskeletal Exam: ***  CDAI  Exam: CDAI Score: -- Patient Global: --; Provider Global: -- Swollen: --; Tender: -- Joint Exam 11/12/2019   No joint exam has been documented for this visit   There is currently no information documented on the homunculus. Go to the Rheumatology activity and complete the homunculus joint exam.  Investigation: No additional findings.  Imaging: DG Foot Complete Right  Result Date: 10/22/2019 Please see detailed radiograph report in office note.   Recent Labs: Lab Results  Component Value Date   WBC 26.4 (H) 09/19/2017   HGB 10.3 (L) 09/19/2017   PLT 240 09/19/2017   NA 138 03/21/2016   K 4.0 03/21/2016   CL 104 03/21/2016   CO2 27 03/21/2016   GLUCOSE 81 03/21/2016   BUN 10 03/21/2016   CREATININE 0.84 03/21/2016   BILITOT 0.9 03/21/2016   ALKPHOS 39 03/21/2016   AST 18 03/21/2016   ALT 12 03/21/2016   PROT 6.9 03/21/2016   ALBUMIN 3.9 03/21/2016   CALCIUM 9.1 03/21/2016   GFRAA >89 03/21/2016    Speciality Comments: No specialty comments available.  Procedures:  No procedures performed Allergies: Patient has no known allergies.   Assessment / Plan:     Visit Diagnoses: No diagnosis found.  Orders: No orders of the defined types were placed in this encounter.  No orders of the defined types were placed in this encounter.   Face-to-face time spent with patient was *** minutes. Greater than 50% of time was spent in counseling and coordination of care.  Follow-Up Instructions: No follow-ups on file.   Gearldine Bienenstock, PA-C  Note - This record has been created using Dragon software.  Chart creation errors have been sought, but may not always  have been located. Such creation errors do not reflect on  the standard of medical care.

## 2019-11-10 NOTE — Progress Notes (Signed)
Office Visit Note  Patient: Marcia Bowers             Date of Birth: [birth year]           MRN: [id number]             PCP: Lucila Maine Referring: Teena Irani, PA-C Visit Date: 11/11/2019 Occupation: @GUAROCC @  Subjective:  Other (patient would like to discuss Iliotibial band syndrome due to the leg pain she experiences)   History of Present Illness: Marcia Bowers is a 39 y.o. female with history of myofascial pain syndrome.  She states she is having recurrent problems with IT band syndrome.  She has been doing some stretching exercises which has been helpful.  She states she has been also having discomfort in her right ankle for which she has been seeing Dr. Allena Katz.  She will be getting MRI of her ankle joint to differentiate between the tarsal tunnel syndrome or tibialis posterior tendinitis.  She has been using a boot which is also causing discomfort in her hip.  None of the other joints are painful.  Epicondylitis has been bothering her off and on.  Activities of Daily Living:  Patient reports morning stiffness for  A few minutes.   Patient Reports nocturnal pain.  Difficulty dressing/grooming: Denies Difficulty climbing stairs: Reports Difficulty getting out of chair: Denies Difficulty using hands for taps, buttons, cutlery, and/or writing: Reports  Review of Systems  Constitutional: Positive for fatigue.  HENT: Positive for mouth dryness. Negative for mouth sores and nose dryness.   Eyes: Negative for pain, itching and dryness.  Respiratory: Negative for shortness of breath and difficulty breathing.   Cardiovascular: Negative for chest pain and palpitations.  Gastrointestinal: Positive for constipation. Negative for blood in stool and diarrhea.  Endocrine: Negative for increased urination.  Genitourinary: Negative for difficulty urinating.  Musculoskeletal: Positive for arthralgias, joint pain, myalgias, morning stiffness, muscle tenderness and myalgias.    Skin: Positive for color change. Negative for rash and redness.  Allergic/Immunologic: Negative for susceptible to infections.  Neurological: Positive for numbness, headaches and memory loss. Negative for dizziness and weakness.       H/o migraine  Hematological: Negative for bruising/bleeding tendency.    PMFS History:  Patient Active Problem List   Diagnosis Date Noted  . Normal postpartum course 09/22/2017  . PROM (premature rupture of membranes) 09/18/2017  . Primary osteoarthritis of both knees 11/05/2016  . Myofascial pain syndrome 03/19/2016  . Other chronic pain 03/19/2016  . Other fatigue 03/19/2016  . Myalgia 03/19/2016  . Pain of both hip joints 03/19/2016  . Arthritis, senescent 03/19/2016  . Arthralgia of both knees 03/19/2016  . History of vitamin D deficiency 03/19/2016  . BV (bacterial vaginosis)   . Yeast infection   . Trichomonas   . Chlamydia   . Gonorrhea   . Fibroids   . Umbilical hernia 01/24/2011  . UNSPECIFIED VITAMIN D DEFICIENCY 06/03/2008  . ABNORMAL PAP SMEAR, LGSIL 08/21/2007  . BACK PAIN, LUMBAR 07/30/2007  . BACK PAIN, UPPER 07/30/2007  . MIGRAINE HEADACHE 07/02/2007  . DENTAL CARIES 07/02/2007  . LEG PAIN, CHRONIC 07/02/2007    Past Medical History:  Diagnosis Date  . Abnormal Pap smear   . Anemia   . Bursitis   . BV (bacterial vaginosis)   . Chlamydia   . Fibroids   . Fibromyalgia   . Gonorrhea   . History of chicken pox   . Tendonitis   .  Trichomonas   . Vaginal Pap smear, abnormal   . Yeast infection     Family History  Problem Relation Age of Onset  . Thyroid disease Mother   . Hypertension Father   . Heart disease Other        Heart on the right  . Heart disease Paternal Grandfather   . Hypertension Paternal Grandfather   . Diabetes Paternal Grandfather   . Heart disease Paternal Grandmother   . Hypertension Paternal Grandmother   . Healthy Sister   . Healthy Brother   . Healthy Daughter   . Healthy Daughter   .  Anesthesia problems Neg Hx    Past Surgical History:  Procedure Laterality Date  . CESAREAN SECTION  04/13/2011   Procedure: CESAREAN SECTION;  Surgeon: Purcell Nails, MD;  Location: [facility];  Service: Gynecology;  Laterality: N/A;  Primary cesarean section with delivery of baby girl at 75. Apgars 8/9.  Marland Kitchen CESAREAN SECTION N/A 09/19/2017   Procedure: CESAREAN SECTION;  Surgeon: Essie Hart, MD;  Location: [facility]THING SU[facility];  Service: Obstetrics;  Laterality: N/A;  . INSERTION OF MESH N/A 10/24/2017   Procedure: INSERTION OF MESH;  Surgeon: Griselda Miner, MD;  Location: [facility];  Service: General;  Laterality: N/A;  . TUBAL LIGATION Bilateral 09/19/2017   Procedure: BILATERAL TUBAL LIGATION;  Surgeon: Essie Hart, MD;  Location: [facility]THING SUIT[facility];  Service: Obstetrics;  Laterality: Bilateral;  . UMBILICAL HERNIA REPAIR N/A 10/24/2017   Procedure: UMBILICAL HERNIA REPAIR ERAS PATHWAY;  Surgeon: Griselda Miner, MD;  Location: [facility];  Service: General;  Laterality: N/A;  . WISDOM TOOTH EXTRACTION     Social History   Social History Narrative  . Not on file   Immunization History  Administered Date(s) Administered  . Td 07/30/2007  . Tdap 04/16/2011     Objective: Vital Signs: BP 109/75 (BP Location: Left Arm, Patient Position: Sitting, Cuff Size: Normal)   Pulse 90   Resp 13   Ht 5\' 3"  (1.6 m)   Wt 143 lb (64.9 kg)   BMI 25.33 kg/m    Physical Exam Vitals and nursing note reviewed.  Constitutional:      Appearance: She is well-developed.  HENT:     Head: Normocephalic and atraumatic.  Eyes:     Conjunctiva/sclera: Conjunctivae normal.  Cardiovascular:     Rate and Rhythm: Normal rate and regular rhythm.     Heart sounds: Normal heart sounds.  Pulmonary:     Effort: Pulmonary effort is normal.     Breath sounds: Normal breath sounds.  Abdominal:     General: Bowel sounds are normal.     Palpations: Abdomen is soft.    Musculoskeletal:     Cervical back: Normal range of motion.  Lymphadenopathy:     Cervical: No cervical adenopathy.  Skin:    General: Skin is warm and dry.     Capillary Refill: Capillary refill takes less than 2 seconds.  Neurological:     Mental Status: She is alert and oriented to person, place, and time.  Psychiatric:        Behavior: Behavior normal.      Musculoskeletal Exam: C-spine thoracic and lumbar spine with good range of motion.  Shoulder joints, elbow joints, wrist joints, MCPs PIPs and DIPs in good range of motion with no synovitis.  Hip joints, knee joints with good range of motion.  Right foot was in a boot.  She had tenderness  on palpation over bilateral trochanteric bursa.  CDAI Exam: CDAI Score: -- Patient Global: --; Provider Global: -- Swollen: --; Tender: -- Joint Exam 11/11/2019   No joint exam has been documented for this visit   There is currently no information documented on the homunculus. Go to the Rheumatology activity and complete the homunculus joint exam.  Investigation: No additional findings.  Imaging: DG Foot Complete Right  Result Date: 10/22/2019 Please see detailed radiograph report in office note.   Recent Labs: Lab Results  Component Value Date   WBC 26.4 (H) 09/19/2017   HGB 10.3 (L) 09/19/2017   PLT 240 09/19/2017   NA 138 03/21/2016   K 4.0 03/21/2016   CL 104 03/21/2016   CO2 27 03/21/2016   GLUCOSE 81 03/21/2016   BUN 10 03/21/2016   CREATININE 0.84 03/21/2016   BILITOT 0.9 03/21/2016   ALKPHOS 39 03/21/2016   AST 18 03/21/2016   ALT 12 03/21/2016   PROT 6.9 03/21/2016   ALBUMIN 3.9 03/21/2016   CALCIUM 9.1 03/21/2016   GFRAA >89 03/21/2016    Speciality Comments: No specialty comments available.  Procedures:  No procedures performed Allergies: Patient has no known allergies.   Assessment / Plan:     Visit Diagnoses: Myofascial pain syndrome-she continues to have some generalized pain from myofascial  pain syndrome.  She has been doing stretching exercises which helped.  Other chronic pain-she uses muscle relaxers only occasionally.  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  Lateral epicondylitis of both elbows -she has recurrent issues but currently not active.  Bilateral trochanteric bursitis-we had detailed discussion regarding trochanteric bursitis.  A handout on IT band exercises was given.  Some of the exercises were demonstrated in the office.  Primary osteoarthritis of both knees-she denies any discomfort in her knee joints.  Pain in right foot-she has been having pain and discomfort in her right foot and right ankle.  She is seeing Dr. Allena Katz, a podiatrist.  He is scheduled right ankle joint MRI which is pending at this point.  History of migraine  History of vitamin D deficiency  Educated about COVID-19 virus infection-she does not want to get COVID-19 vaccination.  We had detailed discussion regarding the risk and benefit.  Use of mask, social distancing and hand hygiene was discussed.  I also discussed possible use of COVID-19 antibody in case she develops infection.  Orders: No orders of the defined types were placed in this encounter.  No orders of the defined types were placed in this encounter.    Follow-Up Instructions: Return in about 6 months (around 05/11/2020) for MFPS.   Pollyann Savoy, MD  Note - This record has been created using Animal nutritionist.  Chart creation errors have been sought, but may not always  have been located. Such creation errors do not reflect on  the standard of medical care.

## 2019-11-11 ENCOUNTER — Ambulatory Visit: Payer: BC Managed Care – PPO | Admitting: Rheumatology

## 2019-11-11 ENCOUNTER — Encounter: Payer: Self-pay | Admitting: Rheumatology

## 2019-11-11 ENCOUNTER — Other Ambulatory Visit: Payer: Self-pay

## 2019-11-11 VITALS — BP 109/75 | HR 90 | Resp 13 | Ht 63.0 in | Wt 143.0 lb

## 2019-11-11 DIAGNOSIS — M7061 Trochanteric bursitis, right hip: Secondary | ICD-10-CM

## 2019-11-11 DIAGNOSIS — M7918 Myalgia, other site: Secondary | ICD-10-CM

## 2019-11-11 DIAGNOSIS — Z8669 Personal history of other diseases of the nervous system and sense organs: Secondary | ICD-10-CM

## 2019-11-11 DIAGNOSIS — M7712 Lateral epicondylitis, left elbow: Secondary | ICD-10-CM

## 2019-11-11 DIAGNOSIS — M79671 Pain in right foot: Secondary | ICD-10-CM

## 2019-11-11 DIAGNOSIS — R5383 Other fatigue: Secondary | ICD-10-CM

## 2019-11-11 DIAGNOSIS — M7711 Lateral epicondylitis, right elbow: Secondary | ICD-10-CM

## 2019-11-11 DIAGNOSIS — M170 Bilateral primary osteoarthritis of knee: Secondary | ICD-10-CM

## 2019-11-11 DIAGNOSIS — Z7189 Other specified counseling: Secondary | ICD-10-CM

## 2019-11-11 DIAGNOSIS — G8929 Other chronic pain: Secondary | ICD-10-CM | POA: Diagnosis not present

## 2019-11-11 DIAGNOSIS — Z8639 Personal history of other endocrine, nutritional and metabolic disease: Secondary | ICD-10-CM

## 2019-11-11 DIAGNOSIS — M722 Plantar fascial fibromatosis: Secondary | ICD-10-CM

## 2019-11-11 DIAGNOSIS — M7062 Trochanteric bursitis, left hip: Secondary | ICD-10-CM

## 2019-11-11 NOTE — Patient Instructions (Addendum)
Iliotibial Band Syndrome Rehab Ask your health care provider which exercises are safe for you. Do exercises exactly as told by your health care provider and adjust them as directed. It is normal to feel mild stretching, pulling, tightness, or discomfort as you do these exercises. Stop right away if you feel sudden pain or your pain gets significantly worse. Do not begin these exercises until told by your health care provider. Stretching and range-of-motion exercises These exercises warm up your muscles and joints and improve the movement and flexibility of your hip and pelvis. Quadriceps stretch, prone  1. Lie on your abdomen on a firm surface, such as a bed or padded floor (prone position). 2. Bend your left / right knee and reach back to hold your ankle or pant leg. If you cannot reach your ankle or pant leg, loop a belt around your foot and grab the belt instead. 3. Gently pull your heel toward your buttocks. Your knee should not slide out to the side. You should feel a stretch in the front of your thigh and knee (quadriceps). 4. Hold this position for __________ seconds. Repeat __________ times. Complete this exercise __________ times a day. Iliotibial band stretch An iliotibial band is a strong band of muscle tissue that runs from the outer side of your hip to the outer side of your thigh and knee. 1. Lie on your side with your left / right leg in the top position. 2. Bend both of your knees and grab your left / right ankle. Stretch out your bottom arm to help you balance. 3. Slowly bring your top knee back so your thigh goes behind your trunk. 4. Slowly lower your top leg toward the floor until you feel a gentle stretch on the outside of your left / right hip and thigh. If you do not feel a stretch and your knee will not fall farther, place the heel of your other foot on top of your knee and pull your knee down toward the floor with your foot. 5. Hold this position for __________  seconds. Repeat __________ times. Complete this exercise __________ times a day. Strengthening exercises These exercises build strength and endurance in your hip and pelvis. Endurance is the ability to use your muscles for a long time, even after they get tired. Straight leg raises, side-lying This exercise strengthens the muscles that rotate the leg at the hip and move it away from your body (hip abductors). 1. Lie on your side with your left / right leg in the top position. Lie so your head, shoulder, hip, and knee line up. You may bend your bottom knee to help you balance. 2. Roll your hips slightly forward so your hips are stacked directly over each other and your left / right knee is facing forward. 3. Tense the muscles in your outer thigh and lift your top leg 4-6 inches (10-15 cm). 4. Hold this position for __________ seconds. 5. Slowly return to the starting position. Let your muscles relax completely before doing another repetition. Repeat __________ times. Complete this exercise __________ times a day. Leg raises, prone This exercise strengthens the muscles that move the hips (hip extensors). 1. Lie on your abdomen on your bed or a firm surface. You can put a pillow under your hips if that is more comfortable for your lower back. 2. Bend your left / right knee so your foot is straight up in the air. 3. Squeeze your buttocks muscles and lift your left / right thigh   off the bed. Do not let your back arch. 4. Tense your thigh muscle as hard as you can without increasing any knee pain. 5. Hold this position for __________ seconds. 6. Slowly lower your leg to the starting position and allow it to relax completely. Repeat __________ times. Complete this exercise __________ times a day. Hip hike 1. Stand sideways on a bottom step. Stand on your left / right leg with your other foot unsupported next to the step. You can hold on to the railing or wall for balance if needed. 2. Keep your knees  straight and your torso square. Then lift your left / right hip up toward the ceiling. 3. Slowly let your left / right hip lower toward the floor, past the starting position. Your foot should get closer to the floor. Do not lean or bend your knees. Repeat __________ times. Complete this exercise __________ times a day. This information is not intended to replace advice given to you by your health care provider. Make sure you discuss any questions you have with your health care provider. Document Revised: 04/30/2018 Document Reviewed: 10/29/2017 Elsevier Patient Education  2020 [employer].   COVID-19 vaccine recommendations:   COVID-19 vaccine is recommended for everyone (unless you are allergic to a vaccine component), even if you are on a medication that suppresses your immune system.   Do not take Tylenol or any anti-inflammatory medications (NSAIDs) 24 hours prior to the COVID-19 vaccination.   There is no direct evidence about the efficacy of the COVID-19 vaccine in individuals who are on medications that suppress the immune system.   Even if you are fully vaccinated, and you are on any medications that suppress your immune system, please continue to wear a mask, maintain at least six feet social distance and practice hand hygiene.   If you develop a COVID-19 infection, please contact your PCP or our office to determine if you need antibody infusion.  The booster vaccine is now available for immunocompromised patients. It is advised that if you had Pfizer vaccine you should get [employer] booster.  If you had a Moderna vaccine then you should get a Moderna booster. Johnson and Wynetta Emery does not have a booster vaccine at this time.  Please see the following web sites for updated information.   [email or web address]

## 2019-11-12 ENCOUNTER — Ambulatory Visit: Payer: BC Managed Care – PPO | Admitting: Physician Assistant

## 2019-11-12 DIAGNOSIS — M722 Plantar fascial fibromatosis: Secondary | ICD-10-CM

## 2019-11-12 DIAGNOSIS — G8929 Other chronic pain: Secondary | ICD-10-CM

## 2019-11-12 DIAGNOSIS — M7711 Lateral epicondylitis, right elbow: Secondary | ICD-10-CM

## 2019-11-12 DIAGNOSIS — M170 Bilateral primary osteoarthritis of knee: Secondary | ICD-10-CM

## 2019-11-12 DIAGNOSIS — R5383 Other fatigue: Secondary | ICD-10-CM

## 2019-11-12 DIAGNOSIS — M7918 Myalgia, other site: Secondary | ICD-10-CM

## 2019-11-12 DIAGNOSIS — Z8669 Personal history of other diseases of the nervous system and sense organs: Secondary | ICD-10-CM

## 2019-11-12 DIAGNOSIS — Z8639 Personal history of other endocrine, nutritional and metabolic disease: Secondary | ICD-10-CM

## 2019-11-24 ENCOUNTER — Ambulatory Visit: Payer: BC Managed Care – PPO | Admitting: Podiatry

## 2020-01-04 ENCOUNTER — Other Ambulatory Visit: Payer: BC Managed Care – PPO

## 2020-01-04 DIAGNOSIS — Z20822 Contact with and (suspected) exposure to covid-19: Secondary | ICD-10-CM

## 2020-01-05 LAB — NOVEL CORONAVIRUS, NAA: SARS-CoV-2, NAA: NOT DETECTED

## 2020-01-05 LAB — SARS-COV-2, NAA 2 DAY TAT

## 2020-04-28 NOTE — Progress Notes (Deleted)
Office Visit Note  Patient: Marcia Bowers             Date of Birth: 1980/07/20           MRN: [id number]             PCP: Lucila Maine Referring: Lucila Maine Visit Date: 05/11/2020 Occupation: @GUAROCC @  Subjective:  No chief complaint on file.   History of Present Illness: Marcia Bowers is a 40 y.o. female ***   Activities of Daily Living:  Patient reports morning stiffness for *** {minute/hour:19697}.   Patient {ACTIONS;DENIES/REPORTS:21021675::"Denies"} nocturnal pain.  Difficulty dressing/grooming: {ACTIONS;DENIES/REPORTS:21021675::"Denies"} Difficulty climbing stairs: {ACTIONS;DENIES/REPORTS:21021675::"Denies"} Difficulty getting out of chair: {ACTIONS;DENIES/REPORTS:21021675::"Denies"} Difficulty using hands for taps, buttons, cutlery, and/or writing: {ACTIONS;DENIES/REPORTS:21021675::"Denies"}  No Rheumatology ROS completed.   PMFS History:  Patient Active Problem List   Diagnosis Date Noted  . Normal postpartum course 09/22/2017  . PROM (premature rupture of membranes) 09/18/2017  . Primary osteoarthritis of both knees 11/05/2016  . Myofascial pain syndrome 03/19/2016  . Other chronic pain 03/19/2016  . Other fatigue 03/19/2016  . Myalgia 03/19/2016  . Pain of both hip joints 03/19/2016  . Arthritis, senescent 03/19/2016  . Arthralgia of both knees 03/19/2016  . History of vitamin D deficiency 03/19/2016  . BV (bacterial vaginosis)   . Yeast infection   . Trichomonas   . Chlamydia   . Gonorrhea   . Fibroids   . Umbilical hernia 01/24/2011  . UNSPECIFIED VITAMIN D DEFICIENCY 06/03/2008  . ABNORMAL PAP SMEAR, LGSIL 08/21/2007  . BACK PAIN, LUMBAR 07/30/2007  . BACK PAIN, UPPER 07/30/2007  . MIGRAINE HEADACHE 07/02/2007  . DENTAL CARIES 07/02/2007  . LEG PAIN, CHRONIC 07/02/2007    Past Medical History:  Diagnosis Date  . Abnormal Pap smear   . Anemia   . Bursitis   . BV (bacterial vaginosis)   . Chlamydia   . Fibroids    . Fibromyalgia   . Gonorrhea   . History of chicken pox   . Tendonitis   . Trichomonas   . Vaginal Pap smear, abnormal   . Yeast infection     Family History  Problem Relation Age of Onset  . Thyroid disease Mother   . Hypertension Father   . Heart disease Other        Heart on the right  . Heart disease Paternal Grandfather   . Hypertension Paternal Grandfather   . Diabetes Paternal Grandfather   . Heart disease Paternal Grandmother   . Hypertension Paternal Grandmother   . Healthy Sister   . Healthy Brother   . Healthy Daughter   . Healthy Daughter   . Anesthesia problems Neg Hx    Past Surgical History:  Procedure Laterality Date  . CESAREAN SECTION  04/13/2011   Procedure: CESAREAN SECTION;  Surgeon: Purcell Nails, MD;  Location: [facility];  Service: Gynecology;  Laterality: N/A;  Primary cesarean section with delivery of baby girl at 43. Apgars 8/9.  Marland Kitchen CESAREAN SECTION N/A 09/19/2017   Procedure: CESAREAN SECTION;  Surgeon: Essie Hart, MD;  Location: [facility];  Service: Obstetrics;  Laterality: N/A;  . INSERTION OF MESH N/A 10/24/2017   Procedure: INSERTION OF MESH;  Surgeon: Griselda Miner, MD;  Location: [facility];  Service: General;  Laterality: N/A;  . TUBAL LIGATION Bilateral 09/19/2017   Procedure: BILATERAL TUBAL LIGATION;  Surgeon: Essie Hart, MD;  Location: [facility];  Service: Obstetrics;  Laterality: Bilateral;  .  UMBILICAL HERNIA REPAIR N/A 10/24/2017   Procedure: UMBILICAL HERNIA REPAIR ERAS PATHWAY;  Surgeon: Griselda Miner, MD;  Location: [facility];  Service: General;  Laterality: N/A;  . WISDOM TOOTH EXTRACTION     Social History   Social History Narrative  . Not on file   Immunization History  Administered Date(s) Administered  . Td 07/30/2007  . Tdap 04/16/2011     Objective: Vital Signs: There were no vitals taken for this visit.   Physical Exam   Musculoskeletal Exam: ***  CDAI  Exam: CDAI Score: -- Patient Global: --; Provider Global: -- Swollen: --; Tender: -- Joint Exam 05/11/2020   No joint exam has been documented for this visit   There is currently no information documented on the homunculus. Go to the Rheumatology activity and complete the homunculus joint exam.  Investigation: No additional findings.  Imaging: No results found.  Recent Labs: Lab Results  Component Value Date   WBC 26.4 (H) 09/19/2017   HGB 10.3 (L) 09/19/2017   PLT 240 09/19/2017   NA 138 03/21/2016   K 4.0 03/21/2016   CL 104 03/21/2016   CO2 27 03/21/2016   GLUCOSE 81 03/21/2016   BUN 10 03/21/2016   CREATININE 0.84 03/21/2016   BILITOT 0.9 03/21/2016   ALKPHOS 39 03/21/2016   AST 18 03/21/2016   ALT 12 03/21/2016   PROT 6.9 03/21/2016   ALBUMIN 3.9 03/21/2016   CALCIUM 9.1 03/21/2016   GFRAA >89 03/21/2016    Speciality Comments: No specialty comments available.  Procedures:  No procedures performed Allergies: Patient has no known allergies.   Assessment / Plan:     Visit Diagnoses: No diagnosis found.  Orders: No orders of the defined types were placed in this encounter.  No orders of the defined types were placed in this encounter.   Face-to-face time spent with patient was *** minutes. Greater than 50% of time was spent in counseling and coordination of care.  Follow-Up Instructions: No follow-ups on file.   Ellen Henri, CMA  Note - This record has been created using Animal nutritionist.  Chart creation errors have been sought, but may not always  have been located. Such creation errors do not reflect on  the standard of medical care.

## 2020-05-11 ENCOUNTER — Ambulatory Visit: Payer: BC Managed Care – PPO | Admitting: Physician Assistant

## 2020-05-11 DIAGNOSIS — R5383 Other fatigue: Secondary | ICD-10-CM

## 2020-05-11 DIAGNOSIS — G8929 Other chronic pain: Secondary | ICD-10-CM

## 2020-05-11 DIAGNOSIS — M79671 Pain in right foot: Secondary | ICD-10-CM

## 2020-05-11 DIAGNOSIS — Z8639 Personal history of other endocrine, nutritional and metabolic disease: Secondary | ICD-10-CM

## 2020-05-11 DIAGNOSIS — M170 Bilateral primary osteoarthritis of knee: Secondary | ICD-10-CM

## 2020-05-11 DIAGNOSIS — Z8669 Personal history of other diseases of the nervous system and sense organs: Secondary | ICD-10-CM

## 2020-05-11 DIAGNOSIS — M7061 Trochanteric bursitis, right hip: Secondary | ICD-10-CM

## 2020-05-11 DIAGNOSIS — M7711 Lateral epicondylitis, right elbow: Secondary | ICD-10-CM

## 2020-05-11 DIAGNOSIS — M7918 Myalgia, other site: Secondary | ICD-10-CM

## 2020-05-12 NOTE — Progress Notes (Deleted)
Office Visit Note  Patient: Marcia Bowers             Date of Birth: 19-Apr-1980           MRN: [id number]             PCP: Lucila Maine Referring: Lucila Maine Visit Date: 05/17/2020 Occupation: @GUAROCC @  Subjective:  No chief complaint on file.   History of Present Illness: Marcia Bowers is a 40 y.o. female ***   Activities of Daily Living:  Patient reports morning stiffness for *** {minute/hour:19697}.   Patient {ACTIONS;DENIES/REPORTS:21021675::"Denies"} nocturnal pain.  Difficulty dressing/grooming: {ACTIONS;DENIES/REPORTS:21021675::"Denies"} Difficulty climbing stairs: {ACTIONS;DENIES/REPORTS:21021675::"Denies"} Difficulty getting out of chair: {ACTIONS;DENIES/REPORTS:21021675::"Denies"} Difficulty using hands for taps, buttons, cutlery, and/or writing: {ACTIONS;DENIES/REPORTS:21021675::"Denies"}  No Rheumatology ROS completed.   PMFS History:  Patient Active Problem List   Diagnosis Date Noted  . Normal postpartum course 09/22/2017  . PROM (premature rupture of membranes) 09/18/2017  . Primary osteoarthritis of both knees 11/05/2016  . Myofascial pain syndrome 03/19/2016  . Other chronic pain 03/19/2016  . Other fatigue 03/19/2016  . Myalgia 03/19/2016  . Pain of both hip joints 03/19/2016  . Arthritis, senescent 03/19/2016  . Arthralgia of both knees 03/19/2016  . History of vitamin D deficiency 03/19/2016  . BV (bacterial vaginosis)   . Yeast infection   . Trichomonas   . Chlamydia   . Gonorrhea   . Fibroids   . Umbilical hernia 01/24/2011  . UNSPECIFIED VITAMIN D DEFICIENCY 06/03/2008  . ABNORMAL PAP SMEAR, LGSIL 08/21/2007  . BACK PAIN, LUMBAR 07/30/2007  . BACK PAIN, UPPER 07/30/2007  . MIGRAINE HEADACHE 07/02/2007  . DENTAL CARIES 07/02/2007  . LEG PAIN, CHRONIC 07/02/2007    Past Medical History:  Diagnosis Date  . Abnormal Pap smear   . Anemia   . Bursitis   . BV (bacterial vaginosis)   . Chlamydia   . Fibroids    . Fibromyalgia   . Gonorrhea   . History of chicken pox   . Tendonitis   . Trichomonas   . Vaginal Pap smear, abnormal   . Yeast infection     Family History  Problem Relation Age of Onset  . Thyroid disease Mother   . Hypertension Father   . Heart disease Other        Heart on the right  . Heart disease Paternal Grandfather   . Hypertension Paternal Grandfather   . Diabetes Paternal Grandfather   . Heart disease Paternal Grandmother   . Hypertension Paternal Grandmother   . Healthy Sister   . Healthy Brother   . Healthy Daughter   . Healthy Daughter   . Anesthesia problems Neg Hx    Past Surgical History:  Procedure Laterality Date  . CESAREAN SECTION  04/13/2011   Procedure: CESAREAN SECTION;  Surgeon: Purcell Nails, MD;  Location: [facility];  Service: Gynecology;  Laterality: N/A;  Primary cesarean section with delivery of baby girl at 66. Apgars 8/9.  Marland Kitchen CESAREAN SECTION N/A 09/19/2017   Procedure: CESAREAN SECTION;  Surgeon: Essie Hart, MD;  Location: [facility];  Service: Obstetrics;  Laterality: N/A;  . INSERTION OF MESH N/A 10/24/2017   Procedure: INSERTION OF MESH;  Surgeon: Griselda Miner, MD;  Location: [facility];  Service: General;  Laterality: N/A;  . TUBAL LIGATION Bilateral 09/19/2017   Procedure: BILATERAL TUBAL LIGATION;  Surgeon: Essie Hart, MD;  Location: [facility];  Service: Obstetrics;  Laterality: Bilateral;  .  UMBILICAL HERNIA REPAIR N/A 10/24/2017   Procedure: UMBILICAL HERNIA REPAIR ERAS PATHWAY;  Surgeon: Griselda Miner, MD;  Location: [facility];  Service: General;  Laterality: N/A;  . WISDOM TOOTH EXTRACTION     Social History   Social History Narrative  . Not on file   Immunization History  Administered Date(s) Administered  . Td 07/30/2007  . Tdap 04/16/2011     Objective: Vital Signs: There were no vitals taken for this visit.   Physical Exam   Musculoskeletal Exam: ***  CDAI  Exam: CDAI Score: -- Patient Global: --; Provider Global: -- Swollen: --; Tender: -- Joint Exam 05/17/2020   No joint exam has been documented for this visit   There is currently no information documented on the homunculus. Go to the Rheumatology activity and complete the homunculus joint exam.  Investigation: No additional findings.  Imaging: No results found.  Recent Labs: Lab Results  Component Value Date   WBC 26.4 (H) 09/19/2017   HGB 10.3 (L) 09/19/2017   PLT 240 09/19/2017   NA 138 03/21/2016   K 4.0 03/21/2016   CL 104 03/21/2016   CO2 27 03/21/2016   GLUCOSE 81 03/21/2016   BUN 10 03/21/2016   CREATININE 0.84 03/21/2016   BILITOT 0.9 03/21/2016   ALKPHOS 39 03/21/2016   AST 18 03/21/2016   ALT 12 03/21/2016   PROT 6.9 03/21/2016   ALBUMIN 3.9 03/21/2016   CALCIUM 9.1 03/21/2016   GFRAA >89 03/21/2016    Speciality Comments: No specialty comments available.  Procedures:  No procedures performed Allergies: Patient has no known allergies.   Assessment / Plan:     Visit Diagnoses: No diagnosis found.  Orders: No orders of the defined types were placed in this encounter.  No orders of the defined types were placed in this encounter.   Face-to-face time spent with patient was *** minutes. Greater than 50% of time was spent in counseling and coordination of care.  Follow-Up Instructions: No follow-ups on file.   Ellen Henri, CMA  Note - This record has been created using Animal nutritionist.  Chart creation errors have been sought, but may not always  have been located. Such creation errors do not reflect on  the standard of medical care.

## 2020-05-17 ENCOUNTER — Ambulatory Visit: Payer: BC Managed Care – PPO | Admitting: Physician Assistant

## 2020-05-17 DIAGNOSIS — M7711 Lateral epicondylitis, right elbow: Secondary | ICD-10-CM

## 2020-05-17 DIAGNOSIS — G8929 Other chronic pain: Secondary | ICD-10-CM

## 2020-05-17 DIAGNOSIS — M170 Bilateral primary osteoarthritis of knee: Secondary | ICD-10-CM

## 2020-05-17 DIAGNOSIS — Z8669 Personal history of other diseases of the nervous system and sense organs: Secondary | ICD-10-CM

## 2020-05-17 DIAGNOSIS — M7918 Myalgia, other site: Secondary | ICD-10-CM

## 2020-05-17 DIAGNOSIS — M79671 Pain in right foot: Secondary | ICD-10-CM

## 2020-05-17 DIAGNOSIS — Z8639 Personal history of other endocrine, nutritional and metabolic disease: Secondary | ICD-10-CM

## 2020-05-17 DIAGNOSIS — R5383 Other fatigue: Secondary | ICD-10-CM

## 2020-05-17 DIAGNOSIS — M7061 Trochanteric bursitis, right hip: Secondary | ICD-10-CM

## 2020-05-17 NOTE — Progress Notes (Signed)
Office Visit Note  Patient: Marcia Bowers             Date of Birth: [birth year]           MRN: [id number]             PCP: Lucila Maine Referring: Lucila Maine Visit Date: 05/22/2020 Occupation: @GUAROCC @  Subjective:  Trochanteric bursitis of both hips   History of Present Illness: Marcia Bowers is a 39 y.o. female with history of myofascial pain syndrome and osteoarthritis.  She presents today with discomfort due to trochanteric bursitis of both hips and lower back pain, which started 2 days ago.  She denies any injury prior to the onset of symptoms.  According to the patient her flares are typically related to cooler weather temperatures.  She performs stretching exercises on a regular basis. She has been taking ibuprofen 600 to 800 mg by mouth as needed for pain relief.  She also takes methocarbamol 500 mg twice daily as needed for muscle spasms and Flexeril 10 mg by mouth at bedtime as needed for muscle spasms.  She states her discomfort due to lateral epicondylitis of both elbows has resolved.  She denies any joint pain or joint swelling in her hands at this time.  She has minimal aching in both feet but denies any inflammation.  She was evaluated by Dr. Allena Katz in the past who fit the patient for orthotics, which have alleviated her discomfort.  She states she has noticed her right dragging on occasion while walking.  She denies any obvious muscle weakness and states that it feels the issue is coming from her right hip.   Activities of Daily Living:  Patient reports morning stiffness for 0 minutes.   Patient Reports nocturnal pain.  Difficulty dressing/grooming: Denies Difficulty climbing stairs: Reports Difficulty getting out of chair: Denies Difficulty using hands for taps, buttons, cutlery, and/or writing: Denies  Review of Systems  Constitutional: Positive for fatigue.  HENT: Positive for mouth dryness. Negative for mouth sores and nose dryness.   Eyes:  Positive for dryness. Negative for pain and itching.  Respiratory: Positive for shortness of breath. Negative for difficulty breathing.   Cardiovascular: Negative for chest pain and palpitations.  Gastrointestinal: Negative for blood in stool, constipation and diarrhea.  Endocrine: Negative for increased urination.  Genitourinary: Negative for difficulty urinating.  Musculoskeletal: Positive for arthralgias, joint pain, myalgias and myalgias. Negative for joint swelling, morning stiffness and muscle tenderness.  Skin: Negative for color change, rash and redness.  Allergic/Immunologic: Positive for susceptible to infections.  Neurological: Positive for numbness and memory loss. Negative for dizziness, headaches and weakness.  Hematological: Negative for bruising/bleeding tendency.  Psychiatric/Behavioral: Negative for confusion.    PMFS History:  Patient Active Problem List   Diagnosis Date Noted  . Normal postpartum course 09/22/2017  . PROM (premature rupture of membranes) 09/18/2017  . Primary osteoarthritis of both knees 11/05/2016  . Myofascial pain syndrome 03/19/2016  . Other chronic pain 03/19/2016  . Other fatigue 03/19/2016  . Myalgia 03/19/2016  . Pain of both hip joints 03/19/2016  . Arthritis, senescent 03/19/2016  . Arthralgia of both knees 03/19/2016  . History of vitamin D deficiency 03/19/2016  . BV (bacterial vaginosis)   . Yeast infection   . Trichomonas   . Chlamydia   . Gonorrhea   . Fibroids   . Umbilical hernia 01/24/2011  . UNSPECIFIED VITAMIN D DEFICIENCY 06/03/2008  . ABNORMAL PAP SMEAR, LGSIL 08/21/2007  .  BACK PAIN, LUMBAR 07/30/2007  . BACK PAIN, UPPER 07/30/2007  . MIGRAINE HEADACHE 07/02/2007  . DENTAL CARIES 07/02/2007  . LEG PAIN, CHRONIC 07/02/2007    Past Medical History:  Diagnosis Date  . Abnormal Pap smear   . Anemia   . Bursitis   . BV (bacterial vaginosis)   . Chlamydia   . Fibroids   . Fibromyalgia   . Gonorrhea   . History  of chicken pox   . Tendonitis   . Trichomonas   . Vaginal Pap smear, abnormal   . Yeast infection     Family History  Problem Relation Age of Onset  . Thyroid disease Mother   . Hypertension Father   . Heart disease Other        Heart on the right  . Heart disease Paternal Grandfather   . Hypertension Paternal Grandfather   . Diabetes Paternal Grandfather   . Heart disease Paternal Grandmother   . Hypertension Paternal Grandmother   . Healthy Sister   . Healthy Brother   . Healthy Daughter   . Healthy Daughter   . Anesthesia problems Neg Hx    Past Surgical History:  Procedure Laterality Date  . CESAREAN SECTION  04/13/2011   Procedure: CESAREAN SECTION;  Surgeon: Purcell Nails, MD;  Location: [facility];  Service: Gynecology;  Laterality: N/A;  Primary cesarean section with delivery of baby girl at 44. Apgars 8/9.  Marland Kitchen CESAREAN SECTION N/A 09/19/2017   Procedure: CESAREAN SECTION;  Surgeon: Essie Hart, MD;  Location: [facility];  Service: Obstetrics;  Laterality: N/A;  . INSERTION OF MESH N/A 10/24/2017   Procedure: INSERTION OF MESH;  Surgeon: Griselda Miner, MD;  Location: [facility];  Service: General;  Laterality: N/A;  . TUBAL LIGATION Bilateral 09/19/2017   Procedure: BILATERAL TUBAL LIGATION;  Surgeon: Essie Hart, MD;  Location: [facility];  Service: Obstetrics;  Laterality: Bilateral;  . UMBILICAL HERNIA REPAIR N/A 10/24/2017   Procedure: UMBILICAL HERNIA REPAIR ERAS PATHWAY;  Surgeon: Griselda Miner, MD;  Location: [facility];  Service: General;  Laterality: N/A;  . WISDOM TOOTH EXTRACTION     Social History   Social History Narrative  . Not on file   Immunization History  Administered Date(s) Administered  . Td 07/30/2007  . Tdap 04/16/2011     Objective: Vital Signs: BP 109/72 (BP Location: Left Arm, Patient Position: Sitting, Cuff Size: Normal)   Pulse 84   Resp 14   Ht 5\' 3"  (1.6 m)   Wt 150 lb 9.6 oz (68.3  kg)   BMI 26.68 kg/m    Physical Exam Vitals and nursing note reviewed.  Constitutional:      Appearance: She is well-developed.  HENT:     Head: Normocephalic and atraumatic.  Eyes:     Conjunctiva/sclera: Conjunctivae normal.  Pulmonary:     Effort: Pulmonary effort is normal.  Abdominal:     Palpations: Abdomen is soft.  Musculoskeletal:     Cervical back: Normal range of motion.  Skin:    General: Skin is warm and dry.     Capillary Refill: Capillary refill takes less than 2 seconds.  Neurological:     Mental Status: She is alert and oriented to person, place, and time.  Psychiatric:        Behavior: Behavior normal.      Musculoskeletal Exam: C-spine, thoracic spine, and lumbar spine good ROM. Tenderness over both SI joints.  No midline  spinal tenderness. Shoulder joints, elbow joints, wrist joints, MCPs, PIPs, and DIPs good ROM with no synovitis.  Complete fist formation bilaterally.  Hip joints good ROM.  Tenderness over bilateral trochanteric bursa.  Knee joints good ROM with no warmth or effusion.  Ankle joints good ROM with tenderness along the medial aspect bilaterally.  No warmth or swelling of ankle joints noted.    CDAI Exam: CDAI Score: -- Patient Global: --; Provider Global: -- Swollen: --; Tender: -- Joint Exam 05/22/2020   No joint exam has been documented for this visit   There is currently no information documented on the homunculus. Go to the Rheumatology activity and complete the homunculus joint exam.  Investigation: No additional findings.  Imaging: No results found.  Recent Labs: Lab Results  Component Value Date   WBC 26.4 (H) 09/19/2017   HGB 10.3 (L) 09/19/2017   PLT 240 09/19/2017   NA 138 03/21/2016   K 4.0 03/21/2016   CL 104 03/21/2016   CO2 27 03/21/2016   GLUCOSE 81 03/21/2016   BUN 10 03/21/2016   CREATININE 0.84 03/21/2016   BILITOT 0.9 03/21/2016   ALKPHOS 39 03/21/2016   AST 18 03/21/2016   ALT 12 03/21/2016   PROT  6.9 03/21/2016   ALBUMIN 3.9 03/21/2016   CALCIUM 9.1 03/21/2016   GFRAA >89 03/21/2016    Speciality Comments: No specialty comments available.  Procedures:  No procedures performed Allergies: Patient has no known allergies.   Assessment / Plan:     Visit Diagnoses: Myofascial pain syndrome: She has generalized hyperalgesia and positive tender points on exam.  Overall her myofascial pain has been tolerable.  She presents today with trochanteric bursitis of both hips.  Her generalized pain is exacerbated by weather changes. She has been performing stretching exercises on a regular basis.  We discussed the use of a foam roller and light massage.  She takes methocarbamol 500 mg twice daily as needed and Flexeril 10 mg a mouth at bedtime as needed for muscle spasms.  She takes ibuprofen 600 to 800 mg as needed for mild to moderate pain relief and has Percocet which she takes for moderate to severe pain relief.  We discussed the importance of regular exercise and good sleep hygiene.  She will follow-up in the office in 6 months.  Other chronic pain: She takes percocet as needed for moderate to severe pain relief.   Other fatigue: Stable.  Discussed importance of regular exercise.  Lateral epicondylitis of both elbows: Resolved.  No tenderness to palpation on exam.  Her discomfort resolved after stopping supporting her baby's carrier on her arms.  Primary osteoarthritis of both knees: She has good ROM of both knee joints with no discomfort.  No warmth or effusion of knee joints.  Trochanteric bursitis of both hips: She presents today with discomfort in both hips due to trochanteric bursitis.  She has tenderness to palpation bilaterally.  Discussed different treatment options.  She declined physical therapy and a cortisone injection today.  She was given a handout of home exercises to perform.  We also discussed the use of a foam roller.   Chronic SI joint pain: She has tenderness to palpation  over both SI joints. Discussed the importance of changing positions frequently.  Discussed topical agents.  She declined PT and a cortisone injection today. She will try stretching exercises at home.   Plantar fasciitis, bilateral: Resolved.  She was evaluated by Dr. Allena Katz, and she was fitted for orthotics which have  alleviated her discomfort.   History of vitamin D deficiency: She takes vitamin D 5,000 units daily.   History of migraine  Orders: No orders of the defined types were placed in this encounter.  No orders of the defined types were placed in this encounter.   Follow-Up Instructions: Return in about 6 months (around 11/22/2020) for Myofascial pain, Osteoarthritis.   Gearldine Bienenstock, PA-C  Note - This record has been created using Dragon software.  Chart creation errors have been sought, but may not always  have been located. Such creation errors do not reflect on  the standard of medical care.

## 2020-05-22 ENCOUNTER — Encounter: Payer: Self-pay | Admitting: Physician Assistant

## 2020-05-22 ENCOUNTER — Ambulatory Visit (INDEPENDENT_AMBULATORY_CARE_PROVIDER_SITE_OTHER): Payer: BC Managed Care – PPO | Admitting: Physician Assistant

## 2020-05-22 ENCOUNTER — Other Ambulatory Visit: Payer: Self-pay

## 2020-05-22 VITALS — BP 109/72 | HR 84 | Resp 14 | Ht 63.0 in | Wt 150.6 lb

## 2020-05-22 DIAGNOSIS — M170 Bilateral primary osteoarthritis of knee: Secondary | ICD-10-CM

## 2020-05-22 DIAGNOSIS — R5383 Other fatigue: Secondary | ICD-10-CM

## 2020-05-22 DIAGNOSIS — M7918 Myalgia, other site: Secondary | ICD-10-CM | POA: Diagnosis not present

## 2020-05-22 DIAGNOSIS — Z8639 Personal history of other endocrine, nutritional and metabolic disease: Secondary | ICD-10-CM

## 2020-05-22 DIAGNOSIS — M7712 Lateral epicondylitis, left elbow: Secondary | ICD-10-CM

## 2020-05-22 DIAGNOSIS — G8929 Other chronic pain: Secondary | ICD-10-CM | POA: Diagnosis not present

## 2020-05-22 DIAGNOSIS — Z8669 Personal history of other diseases of the nervous system and sense organs: Secondary | ICD-10-CM

## 2020-05-22 DIAGNOSIS — M7062 Trochanteric bursitis, left hip: Secondary | ICD-10-CM

## 2020-05-22 DIAGNOSIS — M533 Sacrococcygeal disorders, not elsewhere classified: Secondary | ICD-10-CM

## 2020-05-22 DIAGNOSIS — M7711 Lateral epicondylitis, right elbow: Secondary | ICD-10-CM | POA: Diagnosis not present

## 2020-05-22 DIAGNOSIS — M7061 Trochanteric bursitis, right hip: Secondary | ICD-10-CM

## 2020-05-22 DIAGNOSIS — M722 Plantar fascial fibromatosis: Secondary | ICD-10-CM

## 2020-05-22 NOTE — Patient Instructions (Addendum)
Hip Bursitis Rehab Ask your health care provider which exercises are safe for you. Do exercises exactly as told by your health care provider and adjust them as directed. It is normal to feel mild stretching, pulling, tightness, or discomfort as you do these exercises. Stop right away if you feel sudden pain or your pain gets worse. Do not begin these exercises until told by your health care provider. Stretching exercise This exercise warms up your muscles and joints and improves the movement and flexibility of your hip. This exercise also helps to relieve pain and stiffness. Iliotibial band stretch An iliotibial band is a strong band of muscle tissue that runs from the outer side of your hip to the outer side of your thigh and knee. 1. Lie on your side with your left / right leg in the top position. 2. Bend your left / right knee and grab your ankle. Stretch out your bottom arm to help you balance. 3. Slowly bring your knee back so your thigh is behind your body. 4. Slowly lower your knee toward the floor until you feel a gentle stretch on the outside of your left / right thigh. If you do not feel a stretch and your knee will not fall farther, place the heel of your other foot on top of your knee and pull your knee down toward the floor with your foot. 5. Hold this position for __________ seconds. 6. Slowly return to the starting position. Repeat __________ times. Complete this exercise __________ times a day.   Strengthening exercises These exercises build strength and endurance in your hip and pelvis. Endurance is the ability to use your muscles for a long time, even after they get tired. Bridge This exercise strengthens the muscles that move your thigh backward (hip extensors). 1. Lie on your back on a firm surface with your knees bent and your feet flat on the floor. 2. Tighten your buttocks muscles and lift your buttocks off the floor until your trunk is level with your thighs. ? Do not arch  your back. ? You should feel the muscles working in your buttocks and the back of your thighs. If you do not feel these muscles, slide your feet 1-2 inches (2.5-5 cm) farther away from your buttocks. ? If this exercise is too easy, try doing it with your arms crossed over your chest. 3. Hold this position for __________ seconds. 4. Slowly lower your hips to the starting position. 5. Let your muscles relax completely after each repetition. Repeat __________ times. Complete this exercise __________ times a day.   Squats This exercise strengthens the muscles in front of your thigh and knee (quadriceps). 1. Stand in front of a table, with your feet and knees pointing straight ahead. You may rest your hands on the table for balance but not for support. 2. Slowly bend your knees and lower your hips like you are going to sit in a chair. ? Keep your weight over your heels, not over your toes. ? Keep your lower legs upright so they are parallel with the table legs. ? Do not let your hips go lower than your knees. ? Do not bend lower than told by your health care provider. ? If your hip pain increases, do not bend as low. 3. Hold the squat position for __________ seconds. 4. Slowly push with your legs to return to standing. Do not use your hands to pull yourself to standing. Repeat __________ times. Complete this exercise __________ times a day.  Hip hike 1. Stand sideways on a bottom step. Stand on your left / right leg with your other foot unsupported next to the step. You can hold on to the railing or wall for balance if needed. 2. Keep your knees straight and your torso square. Then lift your left / right hip up toward the ceiling. 3. Hold this position for __________ seconds. 4. Slowly let your left / right hip lower toward the floor, past the starting position. Your foot should get closer to the floor. Do not lean or bend your knees. Repeat __________ times. Complete this exercise __________ times  a day. Single leg stand 1. Without shoes, stand near a railing or in a doorway. You may hold on to the railing or door frame as needed for balance. 2. Squeeze your left / right buttock muscles, then lift up your other foot. ? Do not let your left / right hip push out to the side. ? It is helpful to stand in front of a mirror for this exercise so you can watch your hip. 3. Hold this position for __________ seconds. Repeat __________ times. Complete this exercise __________ times a day. This information is not intended to replace advice given to you by your health care provider. Make sure you discuss any questions you have with your health care provider. Document Revised: 05/04/2018 Document Reviewed: 05/04/2018 Elsevier Patient Education  2021 [employer].   Sacroiliac Joint Dysfunction  Sacroiliac joint dysfunction is a condition that causes inflammation on one or both sides of the sacroiliac (SI) joint. The SI joint is the joint between two bones of the pelvis called the sacrum and the ilium. The sacrum is the bone at the base of the spine. The ilium is the large bone that forms the hip. This condition causes deep aching or burning pain in the low back. In some cases, the pain may also spread into one or both buttocks, hips, or thighs. What are the causes? This condition may be caused by:  Pregnancy. During pregnancy, extra stress is put on the SI joints because the pelvis widens.  Injury, such as: ? Injuries from car crashes. ? Sports-related injuries. ? Work-related injuries.  Having one leg that is shorter than the other.  Conditions that affect the joints, such as: ? Rheumatoid arthritis. ? Gout. ? Psoriatic arthritis. ? Joint infection (septic arthritis). Sometimes, the cause of SI joint dysfunction is not known. What are the signs or symptoms? Symptoms of this condition include:  Aching or burning pain in the lower back. The pain may also spread to other areas, such  as: ? Buttocks. ? Groin. ? Thighs.  Muscle spasms in or around the painful areas.  Increased pain when standing, walking, running, stair climbing, bending, or lifting. How is this diagnosed? This condition is diagnosed with a physical exam and your medical history. During the exam, the health care provider may move one or both of your legs to different positions to check for pain. Various tests may be done to confirm the diagnosis, including:  Imaging tests to look for other causes of pain. These may include: ? MRI. ? CT scan. ? Bone scan.  Diagnostic injection. A numbing medicine is injected into the SI joint using a needle. If your pain is temporarily improved or stopped after the injection, this can indicate that SI joint dysfunction is the problem. How is this treated? Treatment depends on the cause and severity of your condition. Treatment options can be noninvasive and may  include:  Ice or heat applied to the lower back area after an injury. This may help reduce pain and muscle spasms.  Medicines to relieve pain or inflammation or to relax the muscles.  Wearing a back brace (sacroiliac brace) to help support the joint while your back is healing.  Physical therapy to increase muscle strength around the joint and flexibility at the joint. This may also involve learning proper body positions and ways of moving to relieve stress on the joint.  Direct manipulation of the SI joint.  Use of a device that provides electrical stimulation to help reduce pain at the joint. Other treatments may include:  Injections of steroid medicine into the joint to reduce pain and swelling.  Radiofrequency ablation. This treatment uses heat to burn away nerves that are carrying pain messages from the joint.  Surgery to put in screws and plates that limit or prevent joint motion. This is rare. Follow these instructions at home: Medicines  Take over-the-counter and prescription medicines only as  told by your health care provider.  Ask your health care provider if the medicine prescribed to you: ? Requires you to avoid driving or using machinery. ? Can cause constipation. You may need to take these actions to prevent or treat constipation:  Drink enough fluid to keep your urine pale yellow.  Take over-the-counter or prescription medicines.  Eat foods that are high in fiber, such as beans, whole grains, and fresh fruits and vegetables.  Limit foods that are high in fat and processed sugars, such as fried or sweet foods. If you have a brace:  Wear the brace as told by your health care provider. Remove it only as told by your health care provider.  Keep the brace clean.  If the brace is not waterproof: ? Do not let it get wet. ? Cover it with a watertight covering when you take a bath or a shower. Managing pain, stiffness, and swelling  Icing can help with pain and swelling. Heat may help with muscle tension or spasms. Ask your health care provider if you should use ice or heat.  If directed, put ice on the affected area: ? If you have a removable brace, remove it as told by your health care provider. ? Put ice in a plastic bag. ? Place a towel between your skin and the bag. ? Leave the ice on for 20 minutes, 2-3 times a day. ? Remove the ice if your skin turns bright red. This is very important. If you cannot feel pain, heat, or cold, you have a greater risk of damage to the area.  If directed, apply heat to the affected area as often as told by your health care provider. Use the heat source that your health care provider recommends, such as a moist heat pack or a heating pad. ? Place a towel between your skin and the heat source. ? Leave the heat on for 20-30 minutes. ? Remove the heat if your skin turns bright red. This is especially important if you are unable to feel pain, heat, or cold. You may have a greater risk of getting burned.      General instructions  Rest  as needed. Return to your normal activities as told by your health care provider. Ask your health care provider what activities are safe for you.  Do exercises as told by your health care provider or physical therapist.  Keep all follow-up visits. This is important. Contact a health care provider if:  Your pain is not controlled with medicine.  You have a fever.  Your pain is getting worse. Get help right away if:  You have weakness, numbness, or tingling in your legs or feet.  You lose control of your bladder or bowels. Summary  Sacroiliac (SI) joint dysfunction is a condition that causes inflammation on one or both sides of the SI joint.  This condition causes deep aching or burning pain in the low back. In some cases, the pain may also spread into one or both buttocks, hips, or thighs.  Treatment depends on the cause and severity of your condition. It may include medicines to reduce pain and swelling or to relax muscles. This information is not intended to replace advice given to you by your health care provider. Make sure you discuss any questions you have with your health care provider. Document Revised: 05/20/2019 Document Reviewed: 05/20/2019 Elsevier Patient Education  [employer].   Back Exercises These exercises help to make your trunk and back strong. They also help to keep the lower back flexible. Doing these exercises can help to prevent back pain or lessen existing pain.  If you have back pain, try to do these exercises 2-3 times each day or as told by your doctor.  As you get better, do the exercises once each day. Repeat the exercises more often as told by your doctor.  To stop back pain from coming back, do the exercises once each day, or as told by your doctor. Exercises Single knee to chest Do these steps 3-5 times in a row for each leg: 1. Lie on your back on a firm bed or the floor with your legs stretched out. 2. Bring one knee to your  chest. 3. Grab your knee or thigh with both hands and hold them it in place. 4. Pull on your knee until you feel a gentle stretch in your lower back or buttocks. 5. Keep doing the stretch for 10-30 seconds. 6. Slowly let go of your leg and straighten it. Pelvic tilt Do these steps 5-10 times in a row: 1. Lie on your back on a firm bed or the floor with your legs stretched out. 2. Bend your knees so they point up to the ceiling. Your feet should be flat on the floor. 3. Tighten your lower belly (abdomen) muscles to press your lower back against the floor. This will make your tailbone point up to the ceiling instead of pointing down to your feet or the floor. 4. Stay in this position for 5-10 seconds while you gently tighten your muscles and breathe evenly. Cat-cow Do these steps until your lower back bends more easily: 1. Get on your hands and knees on a firm surface. Keep your hands under your shoulders, and keep your knees under your hips. You may put padding under your knees. 2. Let your head hang down toward your chest. Tighten (contract) the muscles in your belly. Point your tailbone toward the floor so your lower back becomes rounded like the back of a cat. 3. Stay in this position for 5 seconds. 4. Slowly lift your head. Let the muscles of your belly relax. Point your tailbone up toward the ceiling so your back forms a sagging arch like the back of a cow. 5. Stay in this position for 5 seconds.   Press-ups Do these steps 5-10 times in a row: 1. Lie on your belly (face-down) on the floor. 2. Place your hands near your head, about shoulder-width  apart. 3. While you keep your back relaxed and keep your hips on the floor, slowly straighten your arms to raise the top half of your body and lift your shoulders. Do not use your back muscles. You may change where you place your hands in order to make yourself more comfortable. 4. Stay in this position for 5 seconds. 5. Slowly return to lying  flat on the floor.   Bridges Do these steps 10 times in a row: 1. Lie on your back on a firm surface. 2. Bend your knees so they point up to the ceiling. Your feet should be flat on the floor. Your arms should be flat at your sides, next to your body. 3. Tighten your butt muscles and lift your butt off the floor until your waist is almost as high as your knees. If you do not feel the muscles working in your butt and the back of your thighs, slide your feet 1-2 inches farther away from your butt. 4. Stay in this position for 3-5 seconds. 5. Slowly lower your butt to the floor, and let your butt muscles relax. If this exercise is too easy, try doing it with your arms crossed over your chest.   Belly crunches Do these steps 5-10 times in a row: 1. Lie on your back on a firm bed or the floor with your legs stretched out. 2. Bend your knees so they point up to the ceiling. Your feet should be flat on the floor. 3. Cross your arms over your chest. 4. Tip your chin a little bit toward your chest but do not bend your neck. 5. Tighten your belly muscles and slowly raise your chest just enough to lift your shoulder blades a tiny bit off of the floor. Avoid raising your body higher than that, because it can put too much stress on your low back. 6. Slowly lower your chest and your head to the floor. Back lifts Do these steps 5-10 times in a row: 1. Lie on your belly (face-down) with your arms at your sides, and rest your forehead on the floor. 2. Tighten the muscles in your legs and your butt. 3. Slowly lift your chest off of the floor while you keep your hips on the floor. Keep the back of your head in line with the curve in your back. Look at the floor while you do this. 4. Stay in this position for 3-5 seconds. 5. Slowly lower your chest and your face to the floor. Contact a doctor if:  Your back pain gets a lot worse when you do an exercise.  Your back pain does not get better 2 hours after you  exercise. If you have any of these problems, stop doing the exercises. Do not do them again unless your doctor says it is okay. Get help right away if:  You have sudden, very bad back pain. If this happens, stop doing the exercises. Do not do them again unless your doctor says it is okay. This information is not intended to replace advice given to you by your health care provider. Make sure you discuss any questions you have with your health care provider. Document Revised: 10/02/2017 Document Reviewed: 10/02/2017 Elsevier Patient Education  2021 [employer].

## 2020-09-01 ENCOUNTER — Other Ambulatory Visit (HOSPITAL_COMMUNITY): Payer: Self-pay

## 2020-10-05 NOTE — Progress Notes (Signed)
Office Visit Note  Patient: Marcia Bowers             Date of Birth: 03-Apr-1980           MRN: [id number]             PCP: Lucila Maine Referring: Lucila Maine Visit Date: 10/06/2020 Occupation: @GUAROCC @  Subjective:  Right sided low back pain   History of Present Illness: Marcia Bowers is a 40 y.o. female with history of myofascial pain and osteoarthritis.  Patient present she continues to have generalized myalgias and muscle tenderness due to myofascial pain.  Overall her symptoms have been fairly stable.  She states that on Monday of this week she experienced new onset right-sided lower back pain.  She states that she did not have any injury or fall prior to the onset of symptoms.  She states that the pain feels like sciatica which she has experienced in the past.  The stabbing shooting pain starts on the right side of her lower back and radiates down her right leg.  She denies any numbness.  She has not had any weakness.  Her discomfort is most severe when walking.  She tried taking some leftover hydrocodone as well as Flexeril this week which alleviated some of her discomfort and allowed her to sleep. She denies any other joint pain or joint swelling at this time.   Activities of Daily Living:  Patient reports morning stiffness for 15-20 minutes.   Patient Reports nocturnal pain.  Difficulty dressing/grooming: Denies Difficulty climbing stairs: Denies Difficulty getting out of chair: Denies Difficulty using hands for taps, buttons, cutlery, and/or writing: Denies  Review of Systems  Constitutional:  Positive for fatigue.  HENT:  Positive for mouth dryness. Negative for mouth sores and nose dryness.   Eyes:  Positive for dryness. Negative for pain and itching.  Respiratory:  Negative for shortness of breath and difficulty breathing.   Cardiovascular:  Negative for chest pain and palpitations.  Gastrointestinal:  Positive for constipation. Negative for  blood in stool and diarrhea.  Endocrine: Negative for increased urination.  Genitourinary:  Negative for difficulty urinating.  Musculoskeletal:  Positive for joint pain, joint pain, joint swelling, myalgias, morning stiffness, muscle tenderness and myalgias.  Skin:  Positive for color change. Negative for rash and redness.  Allergic/Immunologic: Negative for susceptible to infections.  Neurological:  Positive for memory loss and weakness. Negative for dizziness, numbness and headaches.  Hematological:  Negative for bruising/bleeding tendency.  Psychiatric/Behavioral:  Negative for confusion.    PMFS History:  Patient Active Problem List   Diagnosis Date Noted   Normal postpartum course 09/22/2017   PROM (premature rupture of membranes) 09/18/2017   Primary osteoarthritis of both knees 11/05/2016   Myofascial pain syndrome 03/19/2016   Other chronic pain 03/19/2016   Other fatigue 03/19/2016   Myalgia 03/19/2016   Pain of both hip joints 03/19/2016   Arthritis, senescent 03/19/2016   Arthralgia of both knees 03/19/2016   History of vitamin D deficiency 03/19/2016   BV (bacterial vaginosis)    Yeast infection    Trichomonas    Chlamydia    Gonorrhea    Fibroids    Umbilical hernia 01/24/2011   UNSPECIFIED VITAMIN D DEFICIENCY 06/03/2008   ABNORMAL PAP SMEAR, LGSIL 08/21/2007   BACK PAIN, LUMBAR 07/30/2007   BACK PAIN, UPPER 07/30/2007   MIGRAINE HEADACHE 07/02/2007   DENTAL CARIES 07/02/2007   LEG PAIN, CHRONIC 07/02/2007  Past Medical History:  Diagnosis Date   Abnormal Pap smear    Anemia    Bursitis    BV (bacterial vaginosis)    Chlamydia    Fibroids    Fibromyalgia    Gonorrhea    History of chicken pox    Tendonitis    Trichomonas    Vaginal Pap smear, abnormal    Yeast infection     Family History  Problem Relation Age of Onset   Thyroid disease Mother    Hypertension Father    Heart disease Other        Heart on the right   Heart disease Paternal  Grandfather    Hypertension Paternal Grandfather    Diabetes Paternal Grandfather    Heart disease Paternal Grandmother    Hypertension Paternal Grandmother    Healthy Sister    Healthy Brother    Healthy Daughter    Healthy Daughter    Anesthesia problems Neg Hx    Past Surgical History:  Procedure Laterality Date   CESAREAN SECTION  04/13/2011   Procedure: CESAREAN SECTION;  Surgeon: Purcell Nails, MD;  Location: [facility];  Service: Gynecology;  Laterality: N/A;  Primary cesarean section with delivery of baby girl at 26. Apgars 8/9.   CESAREAN SECTION N/A 09/19/2017   Procedure: CESAREAN SECTION;  Surgeon: Essie Hart, MD;  Location: [facility];  Service: Obstetrics;  Laterality: N/A;   INSERTION OF MESH N/A 10/24/2017   Procedure: INSERTION OF MESH;  Surgeon: Griselda Miner, MD;  Location: [facility];  Service: General;  Laterality: N/A;   TUBAL LIGATION Bilateral 09/19/2017   Procedure: BILATERAL TUBAL LIGATION;  Surgeon: Essie Hart, MD;  Location: [facility];  Service: Obstetrics;  Laterality: Bilateral;   UMBILICAL HERNIA REPAIR N/A 10/24/2017   Procedure: UMBILICAL HERNIA REPAIR ERAS PATHWAY;  Surgeon: Chevis Pretty III, MD;  Location: [facility];  Service: General;  Laterality: N/A;   WISDOM TOOTH EXTRACTION     Social History   Social History Narrative   Not on file   Immunization History  Administered Date(s) Administered   Td 07/30/2007   Tdap 04/16/2011     Objective: Vital Signs: BP 113/74 (BP Location: Left Arm, Patient Position: Sitting, Cuff Size: Normal)   Pulse 75   Ht 5\' 3"  (1.6 m)   Wt 156 lb 9.6 oz (71 kg)   BMI 27.74 kg/m    Physical Exam Vitals and nursing note reviewed.  Constitutional:      Appearance: She is well-developed.  HENT:     Head: Normocephalic and atraumatic.  Eyes:     Conjunctiva/sclera: Conjunctivae normal.  Pulmonary:     Effort: Pulmonary effort is normal.     Breath sounds:  Normal breath sounds.  Abdominal:     Palpations: Abdomen is soft.  Musculoskeletal:     Cervical back: Normal range of motion.  Skin:    General: Skin is warm and dry.     Capillary Refill: Capillary refill takes less than 2 seconds.  Neurological:     Mental Status: She is alert and oriented to person, place, and time.  Psychiatric:        Behavior: Behavior normal.     Musculoskeletal Exam: C-spine, thoracic spine, lumbar spine have good range of motion.  No midline spinal tenderness.  Tenderness over the right SI joint.  Tenderness over bilateral trapezius muscles.  Shoulder joints, but joints, wrist joints, MCPs, PIPs, DIPs have good range  of motion with no synovitis.  Complete fist formation bilaterally.  Hip joints have good range of motion.  Tenderness over the right trochanteric bursa.  Knee joints have good range of motion with no warmth or effusion.  Ankle joints have good range of motion with no tenderness or joint swelling.  CDAI Exam: CDAI Score: -- Patient Global: --; Provider Global: -- Swollen: --; Tender: -- Joint Exam 10/06/2020   No joint exam has been documented for this visit   There is currently no information documented on the homunculus. Go to the Rheumatology activity and complete the homunculus joint exam.  Investigation: No additional findings.  Imaging: No results found.  Recent Labs: Lab Results  Component Value Date   WBC 26.4 (H) 09/19/2017   HGB 10.3 (L) 09/19/2017   PLT 240 09/19/2017   NA 138 03/21/2016   K 4.0 03/21/2016   CL 104 03/21/2016   CO2 27 03/21/2016   GLUCOSE 81 03/21/2016   BUN 10 03/21/2016   CREATININE 0.84 03/21/2016   BILITOT 0.9 03/21/2016   ALKPHOS 39 03/21/2016   AST 18 03/21/2016   ALT 12 03/21/2016   PROT 6.9 03/21/2016   ALBUMIN 3.9 03/21/2016   CALCIUM 9.1 03/21/2016   GFRAA >89 03/21/2016    Speciality Comments: No specialty comments available.  Procedures:  Large Joint Inj: R greater trochanter on  10/06/2020 9:20 AM Indications: pain Details: 27 G 1.5 in needle, lateral approach  Arthrogram: No  Medications: 1 mL lidocaine 1 %; 40 mg triamcinolone acetonide 40 MG/ML Aspirate: 0 mL Outcome: tolerated well, no immediate complications Procedure, treatment alternatives, risks and benefits explained, specific risks discussed. Consent was given by the patient. Immediately prior to procedure a time out was called to verify the correct patient, procedure, equipment, support staff and site/side marked as required. Patient was prepped and draped in the usual sterile fashion.    Sacroiliac Joint Inj on 10/06/2020 9:20 AM Indications: pain Details: 27 G 1.5 in needle, posterior approach Medications: 1 mL lidocaine 1 %; 40 mg triamcinolone acetonide 40 MG/ML Aspirate: 0 mL Outcome: tolerated well, no immediate complications Procedure, treatment alternatives, risks and benefits explained, specific risks discussed. Consent was given by the patient. Immediately prior to procedure a time out was called to verify the correct patient, procedure, equipment, support staff and site/side marked as required. Patient was prepped and draped in the usual sterile fashion.    Allergies: Patient has no known allergies.   Assessment / Plan:     Visit Diagnoses: Myofascial pain syndrome - She continues to experience intermittent myalgias and muscle tenderness due to myofascial pain syndrome.  She is having trapezius muscle tension and tenderness bilaterally.  She continues to take methocarbamol 500 mg twice daily as needed and Flexeril 10 mg a mouth at bedtime as needed for muscle spasms.  Discussed the importance of regular exercise and good sleep hygiene. She will follow-up in the office in 6 months.  Other chronic pain: She takes Percocet every 6 hours as needed for pain relief.  Other fatigue: Stable  Lateral epicondylitis of both elbows - Resolved.   Primary osteoarthritis of both knees: She has good ROM  of both knee joints with no discomfort.  No warmth or effusion of knee joints.   Trochanteric bursitis, right hip - She presents today with tenderness over the right trochanteric bursa.  After informed consent the right trochanteric bursa was injected with cortisone.  She tolerated procedure well.  Procedure note was completed above.  Aftercare was discussed.  Plan: XR Pelvis 1-2 Views  Chronic SI joint pain -She presents today with right-sided lower back pain which started on Monday.  She not had any injury or fall prior to the onset of symptoms.  Her symptoms are consistent with right-sided sciatica.  She has tenderness over the right SI joint and over the right trochanteric bursa on examination today.  X-rays of the pelvis were obtained for further evaluation.  After informed consent the right SI joint and right trochanteric bursa were injected with cortisone.  She tolerated procedure well.  Procedure notes were completed above.  Aftercare was discussed.  She was advised to notify us if her discomfort persists or worsens.  Plan: XR Pelvis 1-2 Views  Plantar fasciitis, bilateral - Resolved.  She was evaluated by Dr. Allena Katz, and she was fitted for orthotics which have alleviated her discomfort.   Other medical conditions are listed as follows:   History of vitamin D deficiency  History of migraine  Orders: Orders Placed This Encounter  Procedures   Large Joint Inj   Sacroiliac Joint Inj   XR Pelvis 1-2 Views    No orders of the defined types were placed in this encounter.     Follow-Up Instructions: Return in about 6 months (around 04/05/2021) for Myofascial pain .   Gearldine Bienenstock, PA-C  Note - This record has been created using Dragon software.  Chart creation errors have been sought, but may not always  have been located. Such creation errors do not reflect on  the standard of medical care.

## 2020-10-06 ENCOUNTER — Encounter: Payer: Self-pay | Admitting: Physician Assistant

## 2020-10-06 ENCOUNTER — Ambulatory Visit (INDEPENDENT_AMBULATORY_CARE_PROVIDER_SITE_OTHER): Payer: BC Managed Care – PPO | Admitting: Physician Assistant

## 2020-10-06 ENCOUNTER — Other Ambulatory Visit: Payer: Self-pay

## 2020-10-06 ENCOUNTER — Ambulatory Visit: Payer: Self-pay

## 2020-10-06 VITALS — BP 113/74 | HR 75 | Ht 63.0 in | Wt 156.6 lb

## 2020-10-06 DIAGNOSIS — M533 Sacrococcygeal disorders, not elsewhere classified: Secondary | ICD-10-CM

## 2020-10-06 DIAGNOSIS — Z8669 Personal history of other diseases of the nervous system and sense organs: Secondary | ICD-10-CM

## 2020-10-06 DIAGNOSIS — M7918 Myalgia, other site: Secondary | ICD-10-CM | POA: Diagnosis not present

## 2020-10-06 DIAGNOSIS — M7712 Lateral epicondylitis, left elbow: Secondary | ICD-10-CM

## 2020-10-06 DIAGNOSIS — G8929 Other chronic pain: Secondary | ICD-10-CM

## 2020-10-06 DIAGNOSIS — R5383 Other fatigue: Secondary | ICD-10-CM

## 2020-10-06 DIAGNOSIS — M7061 Trochanteric bursitis, right hip: Secondary | ICD-10-CM | POA: Diagnosis not present

## 2020-10-06 DIAGNOSIS — M7711 Lateral epicondylitis, right elbow: Secondary | ICD-10-CM | POA: Diagnosis not present

## 2020-10-06 DIAGNOSIS — Z8639 Personal history of other endocrine, nutritional and metabolic disease: Secondary | ICD-10-CM

## 2020-10-06 DIAGNOSIS — M722 Plantar fascial fibromatosis: Secondary | ICD-10-CM

## 2020-10-06 DIAGNOSIS — M170 Bilateral primary osteoarthritis of knee: Secondary | ICD-10-CM

## 2020-10-06 MED ORDER — LIDOCAINE HCL 1 % IJ SOLN
1.0000 mL | INTRAMUSCULAR | Status: AC | PRN
  Administered 2020-10-06: 1 mL

## 2020-10-06 MED ORDER — TRIAMCINOLONE ACETONIDE 40 MG/ML IJ SUSP
40.0000 mg | INTRAMUSCULAR | Status: AC | PRN
  Administered 2020-10-06: 40 mg via INTRA_ARTICULAR

## 2020-11-06 NOTE — Progress Notes (Signed)
Office Visit Note  Patient: Marcia Bowers             Date of Birth: 12/18/1980           MRN: [id number]             PCP: Lucila Maine Referring: Teena Irani, PA-C Visit Date: 11/20/2020 Occupation: @GUAROCC @  Subjective:  Right trochanteric bursitis   History of Present Illness: Marcia Bowers is a 40 y.o. female with history of myofascial pain and osteoarthritis. Patient reports she continues to have intermittent myalgias and muscle tenderness due to myofascial pain.  She states her discomfort is typically tolerable but is exacerbated by weather changes.  She takes methocarbamol and Flexeril very sparingly for muscle spasms.  She states that this past weekend she was lying on her right side for prolonged period of time and started to have a burning sensation over the right trochanteric bursa.  She has had some residual discomfort since then.  She states overall her pain has been tolerable and has not missed to the point that it was back in September when she required an injection.  She states that the right SI joint and right trochanter bursa cortisone injections on 10/06/2020 provided significant relief.  She denies any increased lower back or groin pain currently.  She continues to experience intermittent discomfort in both knee joints but denies any joint swelling.  She has occasional stiffness in both hands but denies any joint swelling at this time.   Activities of Daily Living:  Patient reports morning stiffness for 10-15 minutes.   Patient Reports nocturnal pain.  Difficulty dressing/grooming: Denies Difficulty climbing stairs: Reports Difficulty getting out of chair: Reports Difficulty using hands for taps, buttons, cutlery, and/or writing: Denies  Review of Systems  Constitutional:  Positive for fatigue.  HENT:  Negative for mouth sores, mouth dryness and nose dryness.   Eyes:  Negative for pain, itching and dryness.  Respiratory:  Negative for shortness  of breath and difficulty breathing.   Cardiovascular:  Negative for chest pain and palpitations.  Gastrointestinal:  Positive for constipation. Negative for blood in stool and diarrhea.  Endocrine: Negative for increased urination.  Genitourinary:  Negative for difficulty urinating.  Musculoskeletal:  Positive for joint pain, joint pain, joint swelling, myalgias, morning stiffness, muscle tenderness and myalgias.  Skin:  Positive for color change. Negative for rash and redness.  Allergic/Immunologic: Negative for susceptible to infections.  Neurological:  Positive for numbness. Negative for dizziness, headaches and memory loss.  Hematological:  Negative for bruising/bleeding tendency.  Psychiatric/Behavioral:  Negative for confusion.    PMFS History:  Patient Active Problem List   Diagnosis Date Noted   Normal postpartum course 09/22/2017   PROM (premature rupture of membranes) 09/18/2017   Primary osteoarthritis of both knees 11/05/2016   Myofascial pain syndrome 03/19/2016   Other chronic pain 03/19/2016   Other fatigue 03/19/2016   Myalgia 03/19/2016   Pain of both hip joints 03/19/2016   Arthritis, senescent 03/19/2016   Arthralgia of both knees 03/19/2016   History of vitamin D deficiency 03/19/2016   BV (bacterial vaginosis)    Yeast infection    Trichomonas    Chlamydia    Gonorrhea    Fibroids    Umbilical hernia 01/24/2011   UNSPECIFIED VITAMIN D DEFICIENCY 06/03/2008   ABNORMAL PAP SMEAR, LGSIL 08/21/2007   BACK PAIN, LUMBAR 07/30/2007   BACK PAIN, UPPER 07/30/2007   MIGRAINE HEADACHE 07/02/2007   DENTAL CARIES  07/02/2007   LEG PAIN, CHRONIC 07/02/2007    Past Medical History:  Diagnosis Date   Abnormal Pap smear    Anemia    Bursitis    BV (bacterial vaginosis)    Chlamydia    Fibroids    Fibromyalgia    Gonorrhea    History of chicken pox    Tendonitis    Trichomonas    Vaginal Pap smear, abnormal    Yeast infection     Family History  Problem  Relation Age of Onset   Thyroid disease Mother    Hypertension Father    Heart disease Other        Heart on the right   Heart disease Paternal Grandfather    Hypertension Paternal Grandfather    Diabetes Paternal Grandfather    Heart disease Paternal Grandmother    Hypertension Paternal Grandmother    Healthy Sister    Healthy Brother    Healthy Daughter    Healthy Daughter    Anesthesia problems Neg Hx    Past Surgical History:  Procedure Laterality Date   CESAREAN SECTION  04/13/2011   Procedure: CESAREAN SECTION;  Surgeon: Purcell Nails, MD;  Location: [facility];  Service: Gynecology;  Laterality: N/A;  Primary cesarean section with delivery of baby girl at 81. Apgars 8/9.   CESAREAN SECTION N/A 09/19/2017   Procedure: CESAREAN SECTION;  Surgeon: Essie Hart, MD;  Location: [facility];  Service: Obstetrics;  Laterality: N/A;   INSERTION OF MESH N/A 10/24/2017   Procedure: INSERTION OF MESH;  Surgeon: Marcia Miner, MD;  Location: [facility];  Service: General;  Laterality: N/A;   TUBAL LIGATION Bilateral 09/19/2017   Procedure: BILATERAL TUBAL LIGATION;  Surgeon: Essie Hart, MD;  Location: [facility];  Service: Obstetrics;  Laterality: Bilateral;   UMBILICAL HERNIA REPAIR N/A 10/24/2017   Procedure: UMBILICAL HERNIA REPAIR ERAS PATHWAY;  Surgeon: Chevis Pretty III, MD;  Location: [facility];  Service: General;  Laterality: N/A;   WISDOM TOOTH EXTRACTION     Social History   Social History Narrative   Not on file   Immunization History  Administered Date(s) Administered   Td 07/30/2007   Tdap 04/16/2011     Objective: Vital Signs: BP 121/73 (BP Location: Left Arm, Patient Position: Sitting, Cuff Size: Normal)   Pulse 90   Ht 5\' 3"  (1.6 m)   Wt 151 lb 12.8 oz (68.9 kg)   BMI 26.89 kg/m    Physical Exam Vitals and nursing note reviewed.  Constitutional:      Appearance: She is well-developed.  HENT:     Head:  Normocephalic and atraumatic.  Eyes:     Conjunctiva/sclera: Conjunctivae normal.  Pulmonary:     Effort: Pulmonary effort is normal.  Abdominal:     Palpations: Abdomen is soft.  Musculoskeletal:     Cervical back: Normal range of motion.  Skin:    General: Skin is warm and dry.     Capillary Refill: Capillary refill takes less than 2 seconds.  Neurological:     Mental Status: She is alert and oriented to person, place, and time.  Psychiatric:        Behavior: Behavior normal.     Musculoskeletal Exam: C-spine, thoracic spine, and lumbar spine have good ROM.  No midline spinal tenderness.  Tenderness over both SI joints, right > left.  Shoulder joints, elbow joints, wrist joints, MCPs, PIPs, and DIPs good ROM with no synovitis.  Complete fist formation bilaterally.  Complete fist formation bilaterally.  Hip joints have good ROM with no groin pain.  Tenderness over the right trochanteric bursa and IT band.  Knee joints have good ROM with no warmth or effusion.  Ankle joints have good ROM with no tenderness or joint swelling.    CDAI Exam: CDAI Score: -- Patient Global: --; Provider Global: -- Swollen: --; Tender: -- Joint Exam 11/20/2020   No joint exam has been documented for this visit   There is currently no information documented on the homunculus. Go to the Rheumatology activity and complete the homunculus joint exam.  Investigation: No additional findings.  Imaging: No results found.  Recent Labs: Lab Results  Component Value Date   WBC 26.4 (H) 09/19/2017   HGB 10.3 (L) 09/19/2017   PLT 240 09/19/2017   NA 138 03/21/2016   K 4.0 03/21/2016   CL 104 03/21/2016   CO2 27 03/21/2016   GLUCOSE 81 03/21/2016   BUN 10 03/21/2016   CREATININE 0.84 03/21/2016   BILITOT 0.9 03/21/2016   ALKPHOS 39 03/21/2016   AST 18 03/21/2016   ALT 12 03/21/2016   PROT 6.9 03/21/2016   ALBUMIN 3.9 03/21/2016   CALCIUM 9.1 03/21/2016   GFRAA >89 03/21/2016    Speciality  Comments: No specialty comments available.  Procedures:  No procedures performed Allergies: Patient has no known allergies.   Assessment / Plan:     Visit Diagnoses: Myofascial pain syndrome - She experiences intermittent myalgias and muscle tenderness due to underlying myofascial pain syndrome she presents today with tenderness over the right trochanteric bursa after lying on her right side for prolonged period of time this past weekend.  She was strongly encouraged to perform stretching exercises on a daily basis.  She is considering starting to practice yoga. She takes methocarbamol 500 mg twice daily as needed and Flexeril 10 mg a mouth at bedtime as needed for muscle spasms.  She takes both medications sparingly but has a prescription for Percocet which she takes as needed for severe pain relief.  She continues to have chronic fatigue secondary to insomnia.  Most nights she only sleeps 3 to 5 hours per tries to catch up on sleep during the daytime.  Discussed the importance of regular exercise and good sleep hygiene.  She will follow up in the office in 6 months.   Other chronic pain - She takes Percocet every 6 hours as needed for severe pain relief.  Other fatigue: Chronic and secondary to insomnia.  Discussed the importance of regular exercise.    Lateral epicondylitis of both elbows - Resolved.   Primary osteoarthritis of both knees: She has good ROM of both knee joints with no discomfort.  No warmth or effusion noted.  She continues to experience intermittent pain and stiffness in both knee joints.   Trochanteric bursitis, right hip: She has tenderness over the right trochanteric bursa. She had a cortisone injection performed on 10/06/2020 which provided significant relief.  This past weekend she was lying on her right side for prolonged period of time and started to experience a burning sensation over the right trochanteric bursa.  Her discomfort has been mild but persistent over the past  couple of days.  Discussed the importance of performing stretching exercises on a daily basis.  We also discussed the importance of changing positions frequently so she has not laying or sitting for prolonged periods of time.  We also discussed the use of topical agents including  Biofreeze, Salonpas patches, Voltaren gel which she can apply topically as needed for pain relief.  She was advised to notify us if her discomfort persists or worsens.  Chronic SI joint pain: Right-She had a cortisone injection on 10/06/20. She has some tenderness over the right SI joint but her discomfort improved significantly after having the cortisone injection performed. Discussed the importance of performing daily exercises.     Other medical conditions are listed as follows:   Plantar fasciitis, bilateral - Resolved.  She was evaluated by Dr. Allena Katz, and she was fitted for orthotics which have alleviated her discomfort.   History of vitamin D deficiency: She takes vitamin D 5000 units daily.   History of migraine  Orders: No orders of the defined types were placed in this encounter.  No orders of the defined types were placed in this encounter.     Follow-Up Instructions: Return in about 6 months (around 05/20/2021) for Myofascial pain, Osteoarthritis.   Gearldine Bienenstock, PA-C  Note - This record has been created using Dragon software.  Chart creation errors have been sought, but may not always  have been located. Such creation errors do not reflect on  the standard of medical care.

## 2020-11-20 ENCOUNTER — Ambulatory Visit: Payer: BC Managed Care – PPO | Admitting: Physician Assistant

## 2020-11-20 ENCOUNTER — Other Ambulatory Visit: Payer: Self-pay

## 2020-11-20 ENCOUNTER — Encounter: Payer: Self-pay | Admitting: Physician Assistant

## 2020-11-20 VITALS — BP 121/73 | HR 90 | Ht 63.0 in | Wt 151.8 lb

## 2020-11-20 DIAGNOSIS — R5383 Other fatigue: Secondary | ICD-10-CM | POA: Diagnosis not present

## 2020-11-20 DIAGNOSIS — M7711 Lateral epicondylitis, right elbow: Secondary | ICD-10-CM | POA: Diagnosis not present

## 2020-11-20 DIAGNOSIS — G8929 Other chronic pain: Secondary | ICD-10-CM | POA: Diagnosis not present

## 2020-11-20 DIAGNOSIS — M7712 Lateral epicondylitis, left elbow: Secondary | ICD-10-CM

## 2020-11-20 DIAGNOSIS — M533 Sacrococcygeal disorders, not elsewhere classified: Secondary | ICD-10-CM

## 2020-11-20 DIAGNOSIS — M170 Bilateral primary osteoarthritis of knee: Secondary | ICD-10-CM

## 2020-11-20 DIAGNOSIS — M7918 Myalgia, other site: Secondary | ICD-10-CM | POA: Diagnosis not present

## 2020-11-20 DIAGNOSIS — M722 Plantar fascial fibromatosis: Secondary | ICD-10-CM

## 2020-11-20 DIAGNOSIS — M7061 Trochanteric bursitis, right hip: Secondary | ICD-10-CM

## 2020-11-20 DIAGNOSIS — Z8669 Personal history of other diseases of the nervous system and sense organs: Secondary | ICD-10-CM

## 2020-11-20 DIAGNOSIS — Z8639 Personal history of other endocrine, nutritional and metabolic disease: Secondary | ICD-10-CM

## 2020-11-20 NOTE — Patient Instructions (Signed)

## 2022-01-11 ENCOUNTER — Ambulatory Visit: Payer: BC Managed Care – PPO | Admitting: Podiatry

## 2022-03-13 ENCOUNTER — Ambulatory Visit: Payer: Medicaid Other | Admitting: Podiatry

## 2022-03-19 ENCOUNTER — Ambulatory Visit: Payer: Medicaid Other | Admitting: Podiatry

## 2022-03-19 DIAGNOSIS — Q666 Other congenital valgus deformities of feet: Secondary | ICD-10-CM

## 2022-03-19 NOTE — Progress Notes (Signed)
Subjective:  Patient ID: Marcia Bowers, female    DOB: Oct 12, 1980,  MRN: [id number]  Chief Complaint  Patient presents with   Foot Orthotics    Pt is wanting to get new orthotics     42 y.o. female presents with the above complaint.  Patient presents with bilateral flatfoot with history of tarsal tunnel syndrome.  She states that orthotics have helped considerably.  There started while she would like to obtain another pair of orthotics.  She denies any other acute complaints.   Review of Systems: Negative except as noted in the HPI. Denies N/V/F/Ch.  Past Medical History:  Diagnosis Date   Abnormal Pap smear    Anemia    Bursitis    BV (bacterial vaginosis)    Chlamydia    Fibroids    Fibromyalgia    Gonorrhea    History of chicken pox    Tendonitis    Trichomonas    Vaginal Pap smear, abnormal    Yeast infection     Current Outpatient Medications:    Calcium Carbonate (CALCIUM 500 PO), Take by mouth daily., Disp: , Rfl:    cholecalciferol (VITAMIN D) 1000 UNITS tablet, Take 5,000 Units by mouth every morning. , Disp: , Rfl:    cyclobenzaprine (FLEXERIL) 10 MG tablet, TAKE ONE TABLET BY MOUTH ONCE DAILY AT BEDTIME (Patient taking differently: as needed.), Disp: 30 tablet, Rfl: 5   diphenhydramine-acetaminophen (TYLENOL PM) 25-500 MG TABS tablet, Take 1 tablet by mouth at bedtime as needed., Disp: , Rfl:    docusate sodium (COLACE) 100 MG capsule, Take 300 mg by mouth daily. For constipation, Disp: , Rfl:    FENUGREEK PO, Take by mouth daily., Disp: , Rfl:    ferrous sulfate 325 (65 FE) MG tablet, Take 325 mg by mouth 2 (two) times daily with a meal. , Disp: , Rfl:    ibuprofen (ADVIL,MOTRIN) 600 MG tablet, Take 1 tablet (600 mg total) by mouth every 6 (six) hours. (Patient taking differently: Take 600 mg by mouth as needed.), Disp: 30 tablet, Rfl: 0   levOCARNitine (CARNITINE PO), Take by mouth daily., Disp: , Rfl:    methocarbamol (ROBAXIN) 500 MG tablet, Take 1 tablet  (500 mg total) by mouth 2 (two) times daily as needed for muscle spasms., Disp: 60 tablet, Rfl: 2   oxyCODONE-acetaminophen (PERCOCET/ROXICET) 5-325 MG tablet, Take 1 tablet by mouth every 6 (six) hours as needed (pain scale 4-7)., Disp: 15 tablet, Rfl: 0   Prenatal Vit-Fe Fumarate-FA (PRENATAL MULTIVITAMIN) TABS, Take 1 tablet by mouth every morning., Disp: , Rfl:    Probiotic Product (PROBIOTIC PO), Take by mouth daily., Disp: , Rfl:   Social History   Tobacco Use  Smoking Status Never  Smokeless Tobacco Never    No Known Allergies Objective:  There were no vitals filed for this visit. There is no height or weight on file to calculate BMI. Constitutional Well developed. Well nourished.  Vascular Dorsalis pedis pulses palpable bilaterally. Posterior tibial pulses palpable bilaterally. Capillary refill normal to all digits.  No cyanosis or clubbing noted. Pedal hair growth normal.  Neurologic Normal speech. Oriented to person, place, and time. Epicritic sensation to light touch grossly present bilaterally.  Dermatologic Nails well groomed and normal in appearance. No open wounds. No skin lesions.  Orthopedic: Gait examination shows bilateral pes planovalgus deformity with calcaneovalgus to many toe signs partially recruit the arch with dorsiflexion of the hallux unable to perform single and double heel raise no tarsal tunnel  symptoms noted   Radiographs: None Assessment:   1. Pes planovalgus    Plan:  Patient was evaluated and treated and all questions answered.  Bilateral pes planovalgus -All questions and concerns were discussed with the patient in extensive detail -I explained to patient the etiology of pes planovalgus and relationship with Planter fasciitis/arch pain and various treatment options were discussed.  Given patient foot structure in the setting of Planter fasciitis/arch pain I believe patient will benefit from custom-made orthotics to help control the hindfoot  motion support the arch of the foot and take the stress away from plantar fascial.  Patient agrees with the plan like to proceed with orthotics -Patient was casted for orthotics   No follow-ups on file.

## 2022-04-03 ENCOUNTER — Telehealth: Payer: Self-pay | Admitting: Podiatry

## 2022-04-03 NOTE — Telephone Encounter (Signed)
Lmom to call back to schedule appt to pick up orthotics  

## 2022-04-23 NOTE — Progress Notes (Signed)
Office Visit Note  Patient: Marcia Bowers             Date of Birth: [birth year]           MRN: [id number]             PCP: Lucila Maine Referring: Lucila Maine Visit Date: 05/07/2022 Occupation: @GUAROCC @  Subjective:  Pain in both hips, knees and lower back  History of Present Illness: Marcia Bowers is a 42 y.o. female with history of myofascial pain syndrome and osteoarthritis.  She states she continues to have some pain and discomfort in her bilateral hips which she describes over the SI joints and her trochanteric bursa. She denies any radiculopathy.  She also has discomfort in her knee joints which she describes behind the kneecap.  She has not noticed any joint swelling.  She continues to have some generalized aches and pains from fibromyalgia flares.  She states she is in the moving process and has been lifting heavy boxes which is causing discomfort all over her body especially over her elbows.  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     Activities of Daily Living:  Patient reports morning stiffness for 10-15 minutes.   Patient Reports nocturnal pain.  Difficulty dressing/grooming: Denies Difficulty climbing stairs: Denies Difficulty getting out of chair: Denies Difficulty using hands for taps, buttons, cutlery, and/or writing: Reports  Review of Systems  Constitutional:  Positive for fatigue.  HENT:  Positive for mouth dryness. Negative for mouth sores.   Eyes:  Negative for dryness.  Respiratory:  Negative for shortness of breath.   Cardiovascular:  Negative for chest pain and palpitations.  Gastrointestinal:  Positive for constipation. Negative for blood in stool and diarrhea.  Endocrine: Positive for increased urination.  Genitourinary:  Negative for involuntary urination.  Musculoskeletal:  Positive for joint pain, joint pain, joint swelling, myalgias, morning stiffness, muscle tenderness and myalgias.  Negative for gait problem and muscle weakness.  Skin:  Positive for color change. Negative for rash, hair loss and sensitivity to sunlight.  Allergic/Immunologic: Negative for susceptible to infections.  Neurological:  Negative for dizziness and headaches.  Hematological:  Negative for swollen glands.  Psychiatric/Behavioral:  Positive for sleep disturbance. Negative for depressed mood. The patient is nervous/anxious.     PMFS History:  Patient Active Problem List   Diagnosis Date Noted   Normal postpartum course 09/22/2017   PROM (premature rupture of membranes) 09/18/2017   Primary osteoarthritis of both knees 11/05/2016   Myofascial pain syndrome 03/19/2016   Other chronic pain 03/19/2016   Other fatigue 03/19/2016   Myalgia 03/19/2016   Pain of both hip joints 03/19/2016   Arthritis, senescent 03/19/2016   Arthralgia of both knees 03/19/2016   History of vitamin D deficiency 03/19/2016   BV (bacterial vaginosis)    Yeast infection    Trichomonas    Chlamydia    Gonorrhea    Fibroids    Umbilical hernia 01/24/2011   UNSPECIFIED VITAMIN D DEFICIENCY 06/03/2008   ABNORMAL PAP SMEAR, LGSIL 08/21/2007   BACK PAIN, LUMBAR 07/30/2007   BACK PAIN, UPPER 07/30/2007   MIGRAINE HEADACHE 07/02/2007   DENTAL CARIES 07/02/2007   LEG PAIN, CHRONIC 07/02/2007    Past Medical History:  Diagnosis Date   Abnormal Pap smear    Adult attention deficit disorder    per patient   Anemia    Bursitis    BV (bacterial  vaginosis)    Chlamydia    Fibroids    Fibromyalgia    Gonorrhea    History of chicken pox    Tendonitis    Trichomonas    Vaginal Pap smear, abnormal    Yeast infection     Family History  Problem Relation Age of Onset   Thyroid disease Mother    Hypertension Father    Heart disease Other        Heart on the right   Heart disease Paternal Grandfather    Hypertension Paternal Grandfather    Diabetes Paternal Grandfather    Heart disease Paternal Grandmother     Hypertension Paternal Grandmother    Healthy Sister    Healthy Brother    Healthy Daughter    Healthy Daughter    Anesthesia problems Neg Hx    Past Surgical History:  Procedure Laterality Date   CESAREAN SECTION  04/13/2011   Procedure: CESAREAN SECTION;  Surgeon: Purcell Nails, MD;  Location: [facility];  Service: Gynecology;  Laterality: N/A;  Primary cesarean section with delivery of baby girl at 52. Apgars 8/9.   CESAREAN SECTION N/A 09/19/2017   Procedure: CESAREAN SECTION;  Surgeon: Essie Hart, MD;  Location: [facility];  Service: Obstetrics;  Laterality: N/A;   INSERTION OF MESH N/A 10/24/2017   Procedure: INSERTION OF MESH;  Surgeon: Griselda Miner, MD;  Location: [facility];  Service: General;  Laterality: N/A;   TUBAL LIGATION Bilateral 09/19/2017   Procedure: BILATERAL TUBAL LIGATION;  Surgeon: Essie Hart, MD;  Location: [facility];  Service: Obstetrics;  Laterality: Bilateral;   UMBILICAL HERNIA REPAIR N/A 10/24/2017   Procedure: UMBILICAL HERNIA REPAIR ERAS PATHWAY;  Surgeon: Chevis Pretty III, MD;  Location: [facility];  Service: General;  Laterality: N/A;   WISDOM TOOTH EXTRACTION     Social History   Social History Narrative   Not on file   Immunization History  Administered Date(s) Administered   Td 07/30/2007   Tdap 04/16/2011     Objective: Vital Signs: BP 123/80 (BP Location: Left Arm, Patient Position: Sitting, Cuff Size: Normal)   Pulse 89   Resp 15   Ht 5\' 3"  (1.6 m)   Wt 147 lb (66.7 kg)   BMI 26.04 kg/m    Physical Exam Vitals and nursing note reviewed.  Constitutional:      Appearance: She is well-developed.  HENT:     Head: Normocephalic and atraumatic.  Eyes:     Conjunctiva/sclera: Conjunctivae normal.  Cardiovascular:     Rate and Rhythm: Normal rate and regular rhythm.     Heart sounds: Normal heart sounds.  Pulmonary:     Effort: Pulmonary effort is normal.     Breath sounds: Normal breath  sounds.  Abdominal:     General: Bowel sounds are normal.     Palpations: Abdomen is soft.  Musculoskeletal:     Cervical back: Normal range of motion.  Lymphadenopathy:     Cervical: No cervical adenopathy.  Skin:    General: Skin is warm and dry.     Capillary Refill: Capillary refill takes less than 2 seconds.  Neurological:     Mental Status: She is alert and oriented to person, place, and time.  Psychiatric:        Behavior: Behavior normal.      Musculoskeletal Exam: Cervical, thoracic and lumbar spine were in good range of motion.  She had some tenderness in the lower lumbar  region.  She has some tenderness over SI joints.  Shoulder joints, elbow joints, wrist joints, MCPs PIPs and DIPs were in good range of motion with no synovitis.  She had tenderness over bilateral trochanteric bursa.  She had crepitus behind her left knee joint.  No warmth swelling or effusion was noted.  There was no tenderness over ankles or MTPs.  CDAI Exam: CDAI Score: -- Patient Global: --; Provider Global: -- Swollen: --; Tender: -- Joint Exam 05/07/2022   No joint exam has been documented for this visit   There is currently no information documented on the homunculus. Go to the Rheumatology activity and complete the homunculus joint exam.  Investigation: No additional findings.  Imaging: No results found.  Recent Labs: Lab Results  Component Value Date   WBC 26.4 (H) 09/19/2017   HGB 10.3 (L) 09/19/2017   PLT 240 09/19/2017   NA 138 03/21/2016   K 4.0 03/21/2016   CL 104 03/21/2016   CO2 27 03/21/2016   GLUCOSE 81 03/21/2016   BUN 10 03/21/2016   CREATININE 0.84 03/21/2016   BILITOT 0.9 03/21/2016   ALKPHOS 39 03/21/2016   AST 18 03/21/2016   ALT 12 03/21/2016   PROT 6.9 03/21/2016   ALBUMIN 3.9 03/21/2016   CALCIUM 9.1 03/21/2016   GFRAA >89 03/21/2016    Speciality Comments: No specialty comments available.  Procedures:  No procedures performed Allergies: Patient has  no known allergies.   Assessment / Plan:     Visit Diagnoses: Myofascial pain syndrome-she continues to have some generalized pain and discomfort from myofascial pain.  She states she is in the moving process and has been lifting heavy objects which has been causing increased discomfort.  She has been having discomfort in her elbows, hips, lower back and her knees.  She has not noticed any joint swelling.  She continues to take methocarbamol 500 mg twice daily as needed and Flexeril 10 mg p.o. nightly as needed.  Other chronic pain -she is on Percocet every 6 hours as needed for severe pain relief.  Other fatigue-she continues to have some fatigue due to insomnia and myofascial pain syndrome.  Sleep hygiene was discussed.  Lateral epicondylitis of both elbows-she has intermittent discomfort.  She is currently having some discomfort as she has been moving heavy boxes.  Primary osteoarthritis of both knees-I do not have any x-rays of her knee joints.  She had good range of motion without any warmth swelling or effusion.  A handout on lower extremity exercises was given.  Trochanteric bursitis of both hips-she had tenderness over bilateral SI joints and trochanteric bursa.  A handout on IT band stretches was given and exercises were demonstrated in the office.  Chronic SI joint pain-she had x-rays in the past which were reviewed.  X-rays were unremarkable.  Stretching exercises were demonstrated in the office.  I also discussed that we may consider cortisone injection if her symptoms get worse.  Plantar fasciitis, bilateral -patient states that her symptoms resolved after she received orthotics from Dr. Allena Katz.    History of vitamin D deficiency-she takes vitamin D supplement.  History of migraine  Orders: No orders of the defined types were placed in this encounter.  No orders of the defined types were placed in this encounter.    Follow-Up Instructions: Return in about 6 months (around  11/06/2022) for MFPS,OA.   Pollyann Savoy, MD  Note - This record has been created using Animal nutritionist.  Chart creation errors have been  sought, but may not always  have been located. Such creation errors do not reflect on  the standard of medical care.

## 2022-04-30 ENCOUNTER — Ambulatory Visit (INDEPENDENT_AMBULATORY_CARE_PROVIDER_SITE_OTHER): Payer: Medicaid Other | Admitting: Podiatry

## 2022-04-30 ENCOUNTER — Other Ambulatory Visit: Payer: Medicaid Other

## 2022-04-30 DIAGNOSIS — Q666 Other congenital valgus deformities of feet: Secondary | ICD-10-CM

## 2022-04-30 NOTE — Progress Notes (Signed)
Patient presents today to pick up custom molded foot orthotics recommended by Dr. PATEL.   Orthotics were dispensed and fit was satisfactory. Reviewed instructions for break-in and wear. Written instructions given to patient.  Patient will follow up as needed.   

## 2022-05-07 ENCOUNTER — Ambulatory Visit: Payer: Medicaid Other | Attending: Rheumatology | Admitting: Rheumatology

## 2022-05-07 ENCOUNTER — Encounter: Payer: Self-pay | Admitting: Rheumatology

## 2022-05-07 VITALS — BP 123/80 | HR 89 | Resp 15 | Ht 63.0 in | Wt 147.0 lb

## 2022-05-07 DIAGNOSIS — M7711 Lateral epicondylitis, right elbow: Secondary | ICD-10-CM

## 2022-05-07 DIAGNOSIS — M7062 Trochanteric bursitis, left hip: Secondary | ICD-10-CM

## 2022-05-07 DIAGNOSIS — M7061 Trochanteric bursitis, right hip: Secondary | ICD-10-CM

## 2022-05-07 DIAGNOSIS — M7918 Myalgia, other site: Secondary | ICD-10-CM

## 2022-05-07 DIAGNOSIS — M533 Sacrococcygeal disorders, not elsewhere classified: Secondary | ICD-10-CM

## 2022-05-07 DIAGNOSIS — R5383 Other fatigue: Secondary | ICD-10-CM | POA: Diagnosis not present

## 2022-05-07 DIAGNOSIS — G8929 Other chronic pain: Secondary | ICD-10-CM

## 2022-05-07 DIAGNOSIS — M170 Bilateral primary osteoarthritis of knee: Secondary | ICD-10-CM

## 2022-05-07 DIAGNOSIS — Z8639 Personal history of other endocrine, nutritional and metabolic disease: Secondary | ICD-10-CM

## 2022-05-07 DIAGNOSIS — Z8669 Personal history of other diseases of the nervous system and sense organs: Secondary | ICD-10-CM

## 2022-05-07 DIAGNOSIS — M7712 Lateral epicondylitis, left elbow: Secondary | ICD-10-CM

## 2022-05-07 DIAGNOSIS — M722 Plantar fascial fibromatosis: Secondary | ICD-10-CM

## 2022-05-07 NOTE — Patient Instructions (Signed)
Iliotibial Band Syndrome Rehab Ask your health care provider which exercises are safe for you. Do exercises exactly as told by your health care provider and adjust them as directed. It is normal to feel mild stretching, pulling, tightness, or discomfort as you do these exercises. Stop right away if you feel sudden pain or your pain gets significantly worse. Do not begin these exercises until told by your health care provider. Stretching and range-of-motion exercises These exercises warm up your muscles and joints and improve the movement and flexibility of your hip and pelvis. Quadriceps stretch, prone  Lie on your abdomen (prone position) on a firm surface, such as a bed or padded floor. Bend your left / right knee and reach back to hold your ankle or pant leg. If you cannot reach your ankle or pant leg, loop a belt around your foot and grab the belt instead. Gently pull your heel toward your buttocks. Your knee should not slide out to the side. You should feel a stretch in the front of your thigh and knee (quadriceps). Hold this position for __________ seconds. Repeat __________ times. Complete this exercise __________ times a day. Iliotibial band stretch An iliotibial band is a strong band of muscle tissue that runs from the outer side of your hip to the outer side of your thigh and knee. Lie on your side with your left / right leg in the top position. Bend both of your knees and grab your left / right ankle. Stretch out your bottom arm to help you balance. Slowly bring your top knee back so your thigh goes behind your trunk. Slowly lower your top leg toward the floor until you feel a gentle stretch on the outside of your left / right hip and thigh. If you do not feel a stretch and your knee will not fall farther, place the heel of your other foot on top of your knee and pull your knee down toward the floor with your foot. Hold this position for __________ seconds. Repeat __________ times.  Complete this exercise __________ times a day. Strengthening exercises These exercises build strength and endurance in your hip and pelvis. Endurance is the ability to use your muscles for a long time, even after they get tired. Straight leg raises, side-lying This exercise strengthens the muscles that rotate the leg at the hip and move it away from your body (hip abductors). Lie on your side with your left / right leg in the top position. Lie so your head, shoulder, hip, and knee line up. You may bend your bottom knee to help you balance. Roll your hips slightly forward so your hips are stacked directly over each other and your left / right knee is facing forward. Tense the muscles in your outer thigh and lift your top leg 4-6 inches (10-15 cm). Hold this position for __________ seconds. Slowly lower your leg to return to the starting position. Let your muscles relax completely before doing another repetition. Repeat __________ times. Complete this exercise __________ times a day. Leg raises, prone This exercise strengthens the muscles that move the hips backward (hip extensors). Lie on your abdomen (prone position) on your bed or a firm surface. You can put a pillow under your hips if that is more comfortable for your lower back. Bend your left / right knee so your foot is straight up in the air. Squeeze your buttocks muscles and lift your left / right thigh off the bed. Do not let your back arch. Tense   your thigh muscle as hard as you can without increasing any knee pain. Hold this position for __________ seconds. Slowly lower your leg to return to the starting position and allow it to relax completely. Repeat __________ times. Complete this exercise __________ times a day. Hip hike Stand sideways on a bottom step. Stand on your left / right leg with your other foot unsupported next to the step. You can hold on to a railing or wall for balance if needed. Keep your knees straight and your  torso square. Then lift your left / right hip up toward the ceiling. Slowly let your left / right hip lower toward the floor, past the starting position. Your foot should get closer to the floor. Do not lean or bend your knees. Repeat __________ times. Complete this exercise __________ times a day. This information is not intended to replace advice given to you by your health care provider. Make sure you discuss any questions you have with your health care provider. Document Revised: 03/17/2019 Document Reviewed: 03/17/2019 Elsevier Patient Education  2023 [employer] Exercises for Chronic Knee Pain Chronic knee pain is pain that lasts longer than 3 months. For most people with chronic knee pain, exercise and weight loss is an important part of treatment. Your health care provider may want you to focus on: Strengthening the muscles that support your knee. This can take pressure off your knee and lessen pain. Preventing knee stiffness. Maintaining or increasing how far you can move your knee. Losing weight (if this applies) to take pressure off your knee, decrease your risk for injury, and make it easier for you to exercise. Your health care provider will help you develop an exercise program that matches your needs and physical abilities. Below are simple, low-impact exercises you can do at home. Ask your health care provider or a physical therapist how often you should do your exercise program and how many times to repeat each exercise. General safety tips Follow these safety tips for exercising with chronic knee pain: Get your health care provider's approval before doing any exercises. Start slowly and stop any time an exercise causes pain. Do not exercise if your knee pain is flaring up. Warm up first. Stretching a cold muscle can cause an injury. Do 5-10 minutes of easy movement or light stretching before beginning your exercise routine. Do 5-10 minutes of low-impact activity (like walking  or cycling) before starting strengthening exercises. Contact your health care provider any time you have pain during or after exercising. Exercise may cause discomfort but should not be painful. It is normal to be a little stiff or sore after exercising.  Stretching and range-of-motion exercises Front thigh stretch  Stand up straight and support your body by holding on to a chair or resting one hand on a wall. With your legs straight and close together, bend one knee to lift your heel up toward your buttocks. Using one hand for support, grab your ankle with your free hand. Pull your foot up closer toward your buttocks to feel the stretch in front of your thigh. Hold the stretch for 30 seconds. Repeat __________ times. Complete this exercise __________ times a day. Back thigh stretch  Sit on the floor with your back straight and your legs out straight in front of you. Place the palms of your hands on the floor and slide them toward your feet as you bend at the hip. Try to touch your nose to your knees and feel the stretch in the   back of your thighs. Hold for 30 seconds. Repeat __________ times. Complete this exercise __________ times a day. Calf stretch  Stand facing a wall. Place the palms of your hands flat against the wall, arms extended, and lean slightly against the wall. Get into a lunge position with one leg bent at the knee and the other leg stretched out straight behind you. Keep both feet facing the wall and increase the bend in your knee while keeping the heel of the other leg flat on the ground. You should feel the stretch in your calf. Hold for 30 seconds. Repeat __________ times. Complete this exercise __________ times a day. Strengthening exercises Straight leg lift Lie on your back with one knee bent and the other leg out straight. Slowly lift the straight leg without bending the knee. Lift until your foot is about 12 inches (30 cm) off the floor. Hold for 3-5 seconds  and slowly lower your leg. Repeat __________ times. Complete this exercise __________ times a day. Single leg dip Stand between two chairs and put both hands on the backs of the chairs for support. Extend one leg out straight with your body weight resting on the heel of the standing leg. Slowly bend your standing knee to dip your body to the level that is comfortable for you. Hold for 3-5 seconds. Repeat __________ times. Complete this exercise __________ times a day. Hamstring curls Stand straight, knees close together, facing the back of a chair. Hold on to the back of a chair with both hands. Keep one leg straight. Bend the other knee while bringing the heel up toward the buttock until the knee is bent at a 90-degree angle (right angle). Hold for 3-5 seconds. Repeat __________ times. Complete this exercise __________ times a day. Wall squat Stand straight with your back, hips, and head against a wall. Step forward one foot at a time with your back still against the wall. Your feet should be 2 feet (61 cm) from the wall at shoulder width. Keeping your back, hips, and head against the wall, slide down the wall to as close of a sitting position as you can get. Hold for 5-10 seconds, then slowly slide back up. Repeat __________ times. Complete this exercise __________ times a day. Step-ups Step up with one foot onto a sturdy platform or stool that is about 6 inches (15 cm) high. Face sideways with one foot on the platform and one on the ground. Place all your weight on the platform foot and lift your body off the ground until your knee extends. Let your other leg hang free to the side. Hold for 3-5 seconds then slowly lower your weight down to the floor foot. Repeat __________ times. Complete this exercise __________ times a day. Contact a health care provider if: Your exercise causes pain. Your pain is worse after you exercise. Your pain prevents you from doing your exercises. This  information is not intended to replace advice given to you by your health care provider. Make sure you discuss any questions you have with your health care provider. Document Revised: 05/13/2019 Document Reviewed: 01/04/2019 Elsevier Patient Education  2023 [employer] Inc[employer]  

## 2022-06-03 ENCOUNTER — Ambulatory Visit: Payer: Medicaid Other

## 2022-06-03 DIAGNOSIS — Q666 Other congenital valgus deformities of feet: Secondary | ICD-10-CM

## 2022-08-13 DIAGNOSIS — B029 Zoster without complications: Secondary | ICD-10-CM | POA: Insufficient documentation

## 2022-08-21 ENCOUNTER — Telehealth: Payer: Self-pay | Admitting: Podiatry

## 2022-08-21 NOTE — Telephone Encounter (Signed)
Patient called checking on her orthotics that she brought back to the office 5/13 to have them sent back to be adjusted, she was having a lot of pain from them .   I called [facility] to check the status on the adjustment and they did not receive them back ?  Patient states that when she came in on 5/13 she was measured and the arch was too far back and not high enough they were not comfortable and she was told they would select different materials ?  There is no documentation from her visit, I have nothing to reference .  Rescheduled patient to come in and be rescanned for orthotics

## 2022-08-26 ENCOUNTER — Ambulatory Visit: Payer: Medicaid Other

## 2022-08-26 DIAGNOSIS — Q666 Other congenital valgus deformities of feet: Secondary | ICD-10-CM

## 2022-08-26 NOTE — Progress Notes (Signed)
Patient returned for rescan BIL feet custom foot orthotics that we lost sending back to [facility].  Feet scanned with out incident order placed with [facility] when Items are in patient will return for fitting  Marcia Bowers CPed, CFo, CFm

## 2022-09-12 ENCOUNTER — Ambulatory Visit: Payer: Medicaid Other

## 2022-09-12 NOTE — Progress Notes (Signed)
Patient again not happy with re-make wants leather top like old ones  And states that arch still feels too far back  Also would like re-furbish done on old ones at no cost  I told her I will look into this and get back with her.  Marcia Bowers Cped, CFo, CFm

## 2022-10-15 ENCOUNTER — Other Ambulatory Visit: Payer: Medicaid Other

## 2022-10-21 ENCOUNTER — Telehealth: Payer: Self-pay | Admitting: Rheumatology

## 2022-10-21 NOTE — Telephone Encounter (Signed)
Pt states she's in pain and needs a shot for her sciatica to help her walk. I told pt we are fully booked till her appt on 11/06/22 at 8:50am with Ladona Ridgel. Pt states if she can not get in sooner, if she could have Dr. Corliss Skains send her somewhere to get the shot she needs to help her walk. I put pt on the wait-list as well.

## 2022-10-22 ENCOUNTER — Encounter: Payer: Self-pay | Admitting: Physician Assistant

## 2022-10-22 ENCOUNTER — Ambulatory Visit: Payer: Medicaid Other | Attending: Physician Assistant | Admitting: Physician Assistant

## 2022-10-22 VITALS — BP 106/70 | HR 88 | Resp 14 | Ht 62.0 in | Wt 145.2 lb

## 2022-10-22 DIAGNOSIS — M533 Sacrococcygeal disorders, not elsewhere classified: Secondary | ICD-10-CM | POA: Diagnosis not present

## 2022-10-22 DIAGNOSIS — M170 Bilateral primary osteoarthritis of knee: Secondary | ICD-10-CM

## 2022-10-22 DIAGNOSIS — Z8669 Personal history of other diseases of the nervous system and sense organs: Secondary | ICD-10-CM

## 2022-10-22 DIAGNOSIS — Z8639 Personal history of other endocrine, nutritional and metabolic disease: Secondary | ICD-10-CM

## 2022-10-22 DIAGNOSIS — M7712 Lateral epicondylitis, left elbow: Secondary | ICD-10-CM

## 2022-10-22 DIAGNOSIS — M7711 Lateral epicondylitis, right elbow: Secondary | ICD-10-CM

## 2022-10-22 DIAGNOSIS — G8929 Other chronic pain: Secondary | ICD-10-CM

## 2022-10-22 DIAGNOSIS — R5383 Other fatigue: Secondary | ICD-10-CM

## 2022-10-22 DIAGNOSIS — M7918 Myalgia, other site: Secondary | ICD-10-CM

## 2022-10-22 DIAGNOSIS — M7062 Trochanteric bursitis, left hip: Secondary | ICD-10-CM

## 2022-10-22 DIAGNOSIS — M722 Plantar fascial fibromatosis: Secondary | ICD-10-CM

## 2022-10-22 DIAGNOSIS — M7061 Trochanteric bursitis, right hip: Secondary | ICD-10-CM

## 2022-10-22 MED ORDER — LIDOCAINE HCL 1 % IJ SOLN
1.0000 mL | INTRAMUSCULAR | Status: AC | PRN
  Administered 2022-10-22: 1 mL

## 2022-10-22 MED ORDER — TRIAMCINOLONE ACETONIDE 40 MG/ML IJ SUSP
40.0000 mg | INTRAMUSCULAR | Status: AC | PRN
  Administered 2022-10-22: 40 mg via INTRA_ARTICULAR

## 2022-10-22 NOTE — Progress Notes (Signed)
Office Visit Note  Patient: Marcia Bowers             Date of Birth: 06-17-1980           MRN: [id number]             PCP: Dani Gobble, PA-C Referring: Fransisca Kaufmann* Visit Date: 10/22/2022 Occupation: @GUAROCC @  Subjective:  Right SI joint pain   History of Present Illness: Marcia Bowers is a 42 y.o. female with history of myofascial pain syndrome.  Patient presents today with a recurrence of SI joint pain bilaterally, right greater than left.  She has not had any recent injury or fall.  Her symptoms initially started about 1 week ago which she attributes to rainy weather.  At that time she was having increased myofascial pain in bilateral lower extremities as well as discomfort in both SI joints.  She continues to have muscle spasms intermittently especially involving her right leg.  She tried taking a half a tablet of Flexeril but has had persistent discomfort.  Patient requested a right SI joint cortisone injection today, which has helped to alleviate her symptoms in the past.    Activities of Daily Living:  Patient reports morning stiffness for 0 minutes  Patient Reports nocturnal pain.  Difficulty dressing/grooming: Denies Difficulty climbing stairs: Reports Difficulty getting out of chair: Reports Difficulty using hands for taps, buttons, cutlery, and/or writing: Denies  Review of Systems  Constitutional:  Positive for fatigue.  HENT:  Negative for mouth sores, mouth dryness and nose dryness.   Eyes:  Negative for pain, visual disturbance and dryness.  Respiratory:  Negative for cough, hemoptysis, shortness of breath and difficulty breathing.   Cardiovascular:  Negative for chest pain, palpitations, hypertension and swelling in legs/feet.  Gastrointestinal:  Positive for constipation. Negative for blood in stool and diarrhea.  Endocrine: Negative for increased urination.  Genitourinary:  Negative for painful urination.  Musculoskeletal:  Positive  for myalgias, muscle tenderness and myalgias. Negative for joint pain, joint pain, joint swelling, muscle weakness and morning stiffness.  Skin:  Negative for color change, pallor, rash, hair loss, nodules/bumps, skin tightness, ulcers and sensitivity to sunlight.  Allergic/Immunologic: Negative for susceptible to infections.  Neurological:  Negative for dizziness, numbness, headaches and weakness.  Hematological:  Negative for swollen glands.  Psychiatric/Behavioral:  Positive for depressed mood. Negative for sleep disturbance. The patient is nervous/anxious.     PMFS History:  Patient Active Problem List   Diagnosis Date Noted   Normal postpartum course 09/22/2017   PROM (premature rupture of membranes) 09/18/2017   Primary osteoarthritis of both knees 11/05/2016   Myofascial pain syndrome 03/19/2016   Other chronic pain 03/19/2016   Other fatigue 03/19/2016   Myalgia 03/19/2016   Pain of both hip joints 03/19/2016   Arthritis, senescent 03/19/2016   Arthralgia of both knees 03/19/2016   History of vitamin D deficiency 03/19/2016   BV (bacterial vaginosis)    Yeast infection    Trichomonas    Chlamydia    Gonorrhea    Fibroids    Umbilical hernia 01/24/2011   UNSPECIFIED VITAMIN D DEFICIENCY 06/03/2008   ABNORMAL PAP SMEAR, LGSIL 08/21/2007   BACK PAIN, LUMBAR 07/30/2007   BACK PAIN, UPPER 07/30/2007   MIGRAINE HEADACHE 07/02/2007   DENTAL CARIES 07/02/2007   LEG PAIN, CHRONIC 07/02/2007    Past Medical History:  Diagnosis Date   Abnormal Pap smear    Adult attention deficit disorder    per  patient   Anemia    Bursitis    BV (bacterial vaginosis)    Chlamydia    Fibroids    Fibromyalgia    Gonorrhea    History of chicken pox    Tendonitis    Trichomonas    Vaginal Pap smear, abnormal    Yeast infection     Family History  Problem Relation Age of Onset   Thyroid disease Mother    Hypertension Father    Heart disease Other        Heart on the right   Heart  disease Paternal Grandfather    Hypertension Paternal Grandfather    Diabetes Paternal Grandfather    Heart disease Paternal Grandmother    Hypertension Paternal Grandmother    Healthy Sister    Healthy Brother    Healthy Daughter    Healthy Daughter    Anesthesia problems Neg Hx    Past Surgical History:  Procedure Laterality Date   CESAREAN SECTION  04/13/2011   Procedure: CESAREAN SECTION;  Surgeon: Purcell Nails, MD;  Location: [facility];  Service: Gynecology;  Laterality: N/A;  Primary cesarean section with delivery of baby girl at 55. Apgars 8/9.   CESAREAN SECTION N/A 09/19/2017   Procedure: CESAREAN SECTION;  Surgeon: Essie Hart, MD;  Location: [facility];  Service: Obstetrics;  Laterality: N/A;   INSERTION OF MESH N/A 10/24/2017   Procedure: INSERTION OF MESH;  Surgeon: Griselda Miner, MD;  Location: [facility];  Service: General;  Laterality: N/A;   TUBAL LIGATION Bilateral 09/19/2017   Procedure: BILATERAL TUBAL LIGATION;  Surgeon: Essie Hart, MD;  Location: [facility];  Service: Obstetrics;  Laterality: Bilateral;   UMBILICAL HERNIA REPAIR N/A 10/24/2017   Procedure: UMBILICAL HERNIA REPAIR ERAS PATHWAY;  Surgeon: Griselda Miner, MD;  Location: [facility];  Service: General;  Laterality: N/A;   WISDOM TOOTH EXTRACTION     Social History   Social History Narrative   Not on file   Immunization History  Administered Date(s) Administered   Td 07/30/2007   Tdap 04/16/2011     Objective: Vital Signs: BP 106/70 (BP Location: Left Arm, Patient Position: Sitting, Cuff Size: Normal)   Pulse 88   Resp 14   Ht 5\' 2"  (1.575 m)   Wt 145 lb 3.2 oz (65.9 kg)   Breastfeeding No   BMI 26.56 kg/m    Physical Exam Vitals and nursing note reviewed.  Constitutional:      Appearance: She is well-developed.  HENT:     Head: Normocephalic and atraumatic.  Eyes:     Conjunctiva/sclera: Conjunctivae normal.  Cardiovascular:     Rate  and Rhythm: Normal rate and regular rhythm.     Heart sounds: Normal heart sounds.  Pulmonary:     Effort: Pulmonary effort is normal.     Breath sounds: Normal breath sounds.  Abdominal:     General: Bowel sounds are normal.     Palpations: Abdomen is soft.  Musculoskeletal:     Cervical back: Normal range of motion.  Lymphadenopathy:     Cervical: No cervical adenopathy.  Skin:    General: Skin is warm and dry.     Capillary Refill: Capillary refill takes less than 2 seconds.  Neurological:     Mental Status: She is alert and oriented to person, place, and time.  Psychiatric:        Behavior: Behavior normal.      Musculoskeletal Exam: Generalized  hyperalgesia and positive tender points on exam.  C-spine has good range of motion.  Trapezius muscle tension tenderness bilaterally, left greater than right.  Midline spinal tenderness in the thoracic region.  Tenderness over both SI joints, right greater than left.  Shoulder joints, elbow joints, wrist joints, MCPs, PIPs, DIPs have good range of motion with no synovitis.  Complete fist formation noted bilaterally.  Hip joints have good range of motion with no groin pain.  Tenderness over the right trochanteric bursa.  Tenderness along the right IT band.  Knee joints have good range of motion with no warmth or effusion.  Ankle joints have good range of motion with no tenderness or joint swelling.    CDAI Exam: CDAI Score: -- Patient Global: --; Provider Global: -- Swollen: --; Tender: -- Joint Exam 10/22/2022   No joint exam has been documented for this visit   There is currently no information documented on the homunculus. Go to the Rheumatology activity and complete the homunculus joint exam.  Investigation: No additional findings.  Imaging: No results found.  Recent Labs: Lab Results  Component Value Date   WBC 26.4 (H) 09/19/2017   HGB 10.3 (L) 09/19/2017   PLT 240 09/19/2017   NA 138 03/21/2016   K 4.0 03/21/2016   CL  104 03/21/2016   CO2 27 03/21/2016   GLUCOSE 81 03/21/2016   BUN 10 03/21/2016   CREATININE 0.84 03/21/2016   BILITOT 0.9 03/21/2016   ALKPHOS 39 03/21/2016   AST 18 03/21/2016   ALT 12 03/21/2016   PROT 6.9 03/21/2016   ALBUMIN 3.9 03/21/2016   CALCIUM 9.1 03/21/2016   GFRAA >89 03/21/2016    Speciality Comments: No specialty comments available.  Procedures:  Sacroiliac Joint Inj on 10/22/2022 9:34 AM Indications: pain Details: 27 G 1.5 in needle, posterior approach Medications: 1 mL lidocaine 1 %; 40 mg triamcinolone acetonide 40 MG/ML Aspirate: 0 mL Outcome: tolerated well, no immediate complications Procedure, treatment alternatives, risks and benefits explained, specific risks discussed. Consent was given by the patient. Immediately prior to procedure a time out was called to verify the correct patient, procedure, equipment, support staff and site/side marked as required. Patient was prepped and draped in the usual sterile fashion.     Allergies: Patient has no known allergies.   Assessment / Plan:     Visit Diagnoses: Myofascial pain syndrome: Patient presents today with generalized hyperalgesia and positive tender points on exam.  She has trapezius muscle tension and tenderness bilaterally, left greater than right.  She is also having myofascial pain involving bilateral lower extremities, right greater than left.  She experiences muscle spasms intermittently.  She recently tried taking Flexeril 5 mg at bedtime for muscle spasms.  She does not like having to take muscle relaxers. Offered a referral to integrative therapies but she would like to hold off at this time.  She will notify us if her symptoms persist or worsen. She will follow up in 6 months or sooner if needed.   Other chronic pain: She takes Percocet every 6 hours as needed for severe pain relief.   Other fatigue: Chronic, stable.   Lateral epicondylitis of both elbows: Not currently symptomatic.   Primary  osteoarthritis of both knees: She has good ROM of both knee joints with no warmth or effusion.    Trochanteric bursitis of both hips: Patient presents today with trapezius muscle tension and tenderness bilaterally, right greater than left.  She has tried performing stretching exercises which seems  to aggravate her discomfort.  She has tried massage in the past.  Offered referral to the grade of therapies but she would like to hold off at this time.  Chronic SI joint pain: Patient presents today with pain involving both SI joints, right greater than left.  No recent injury or fall.  Her symptoms started at 1 week ago and have progressively been worsening.  She is been having difficulty walking due to right-sided sciatica and tenderness along the right IT band.  She is been experiencing muscle spasms especially in her right leg. Patient requested a right SI joint cortisone injection today.  She tolerated procedure well.  Procedure note was completed above.  Aftercare was discussed.  She was advised to notify us if her symptoms persist or worsen.  Plantar fasciitis, bilateral: Not currently symptomatic.    Other medical conditions are listed as follows:   History of vitamin D deficiency  History of migraine  Orders: Orders Placed This Encounter  Procedures   Sacroiliac Joint Inj   No orders of the defined types were placed in this encounter.    Follow-Up Instructions: Return in about 6 months (around 04/22/2023) for Myofascial pain.   Gearldine Bienenstock, PA-C  Note - This record has been created using Dragon software.  Chart creation errors have been sought, but may not always  have been located. Such creation errors do not reflect on  the standard of medical care.

## 2022-10-28 ENCOUNTER — Telehealth: Payer: Self-pay | Admitting: *Deleted

## 2022-10-28 NOTE — Telephone Encounter (Signed)
Patient states she is still having :some serious pain". She states the pain is similar to sciatica. Patient states as the day progresses the pain in her leg increases. Patient states the pain is from the injection site. Patient states the pain is there when she walks and when she is standing. Patient states it is a dull deep ache. Patient states the pain comes and goes with her movement. Please advise.

## 2022-10-28 NOTE — Telephone Encounter (Signed)
I called patient, patient scheduled 10/29/2022 w/Dr. Corliss Skains

## 2022-10-28 NOTE — Telephone Encounter (Signed)
Please clarify if she would like to schedule a sooner follow up with Dr. Corliss Skains or if she would like a referral to a spine specialist?

## 2022-10-29 ENCOUNTER — Ambulatory Visit: Payer: Medicaid Other | Admitting: Rheumatology

## 2022-11-01 ENCOUNTER — Other Ambulatory Visit: Payer: Self-pay

## 2022-11-01 ENCOUNTER — Encounter (HOSPITAL_COMMUNITY): Payer: Self-pay | Admitting: Emergency Medicine

## 2022-11-01 ENCOUNTER — Emergency Department (HOSPITAL_COMMUNITY)
Admission: EM | Admit: 2022-11-01 | Discharge: 2022-11-02 | Disposition: A | Discharge status: Home / Self Care | Payer: Medicaid Other | Attending: Emergency Medicine | Admitting: Emergency Medicine

## 2022-11-01 ENCOUNTER — Emergency Department (HOSPITAL_COMMUNITY): Payer: Medicaid Other

## 2022-11-01 DIAGNOSIS — H538 Other visual disturbances: Secondary | ICD-10-CM | POA: Diagnosis not present

## 2022-11-01 DIAGNOSIS — R519 Headache, unspecified: Secondary | ICD-10-CM | POA: Diagnosis present

## 2022-11-01 LAB — CBC WITH DIFFERENTIAL/PLATELET
Abs Immature Granulocytes: 0.03 10*3/uL (ref 0.00–0.07)
Basophils Absolute: 0.1 10*3/uL (ref 0.0–0.1)
Basophils Relative: 0 %
Eosinophils Absolute: 0 10*3/uL (ref 0.0–0.5)
Eosinophils Relative: 0 %
HCT: 42.5 % (ref 36.0–46.0)
Hemoglobin: 13.1 g/dL (ref 12.0–15.0)
Immature Granulocytes: 0 %
Lymphocytes Relative: 22 %
Lymphs Abs: 2.5 10*3/uL (ref 0.7–4.0)
MCH: 28.5 pg (ref 26.0–34.0)
MCHC: 30.8 g/dL (ref 30.0–36.0)
MCV: 92.4 fL (ref 80.0–100.0)
Monocytes Absolute: 0.7 10*3/uL (ref 0.1–1.0)
Monocytes Relative: 6 %
Neutro Abs: 8.1 10*3/uL — ABNORMAL HIGH (ref 1.7–7.7)
Neutrophils Relative %: 72 %
Platelets: 375 10*3/uL (ref 150–400)
RBC: 4.6 MIL/uL (ref 3.87–5.11)
RDW: 12.5 % (ref 11.5–15.5)
WBC: 11.4 10*3/uL — ABNORMAL HIGH (ref 4.0–10.5)
nRBC: 0 % (ref 0.0–0.2)

## 2022-11-01 LAB — BASIC METABOLIC PANEL
Anion gap: 11 (ref 5–15)
BUN: 5 mg/dL — ABNORMAL LOW (ref 6–20)
CO2: 26 mmol/L (ref 22–32)
Calcium: 9.9 mg/dL (ref 8.9–10.3)
Chloride: 101 mmol/L (ref 98–111)
Creatinine, Ser: 0.82 mg/dL (ref 0.44–1.00)
GFR, Estimated: >60 mL/min (ref >60)
Glucose, Bld: 93 mg/dL (ref 70–99)
Potassium: 4.3 mmol/L (ref 3.5–5.1)
Sodium: 138 mmol/L (ref 135–145)

## 2022-11-01 NOTE — ED Triage Notes (Signed)
Pt BIB EMS from PCP concern for possible stroke, went in for HA, HTN, and changes in vision. Pt states she woke up at 6am yesterday with a mild headache, today at 11am felt that her peripheral vision was off, states "it feels warped, I can see normal when I look straight" along with sensitivity to light. Says she has not felt well since yesterday.

## 2022-11-01 NOTE — ED Provider Triage Note (Signed)
Emergency Medicine Provider Triage Evaluation Note  Marcia Bowers , a 42 y.o. female  was evaluated in triage.  Pt complains of HA, started yesterday, worse today  hat band distribution L>>R, Vision "hazziness" Left ear pain .  Review of Systems  Positive: ha Negative: Neck pain   Physical Exam  BP (!) 132/91 (BP Location: Right Arm)   Pulse 83   Temp 98.4 F (36.9 C) (Oral)   Resp 16   Ht 5\' 3"  (1.6 m)   Wt 64.9 kg   SpO2 100%   BMI 25.33 kg/m  Gen:   Awake, no distress   Resp:  Normal effort  MSK:   Moves extremities without difficulty  Other:  Left TM injected  Medical Decision Making  Medically screening exam initiated at 6:27 PM.  Appropriate orders placed.  Marcia Bowers was informed that the remainder of the evaluation will be completed by another provider, this initial triage assessment does not replace that evaluation, and the importance of remaining in the ED until their evaluation is complete.     Arthor Captain, PA-C 11/01/22 1828

## 2022-11-02 MED ORDER — KETOROLAC TROMETHAMINE 15 MG/ML IJ SOLN
15.0000 mg | Freq: Once | INTRAMUSCULAR | Status: AC
  Administered 2022-11-02: 15 mg via INTRAVENOUS
  Filled 2022-11-02: qty 1

## 2022-11-02 MED ORDER — METOCLOPRAMIDE HCL 5 MG/ML IJ SOLN
10.0000 mg | Freq: Once | INTRAMUSCULAR | Status: AC
  Administered 2022-11-02: 10 mg via INTRAVENOUS
  Filled 2022-11-02 (×2): qty 2

## 2022-11-02 MED ORDER — SODIUM CHLORIDE 0.9 % IV BOLUS
1000.0000 mL | Freq: Once | INTRAVENOUS | Status: AC
  Administered 2022-11-02: 1000 mL via INTRAVENOUS

## 2022-11-02 MED ORDER — DEXAMETHASONE SODIUM PHOSPHATE 10 MG/ML IJ SOLN
10.0000 mg | Freq: Once | INTRAMUSCULAR | Status: AC
  Administered 2022-11-02: 10 mg via INTRAVENOUS
  Filled 2022-11-02: qty 1

## 2022-11-02 NOTE — Discharge Instructions (Addendum)
Evaluation today revealed that you likely had a migraine.  You responded well to treatment today.  Should you start to have the symptoms again, recommend ibuprofen, Zofran, caffeine and assertive hydration with water and Gatorade.  If your headache is worse, you have visual disturbance, develop fever, neck stiffness or any other concerning symptom please return emergency department further evaluation.  Otherwise recommend you follow-up with your PCP.

## 2022-11-02 NOTE — ED Provider Notes (Signed)
[place] EMERGENCY DEPARTMENT AT [facility] Provider Note   CSN: [id number] Arrival date & time: 11/01/22  1802     History  Chief Complaint  Patient presents with   Headache   HPI Marcia Bowers is a 42 y.o. female with history of fibromyalgia and anemia presenting for headache.  States she had a headache last night that was mild when she went to bed.  When she woke up she still had the headache and it persisted through the day.  Took a nap around 11 AM this morning and woke up from the nap and it was much worse at that time.  Pain is primarily concentrated behind the left eye radiates on the left side of the forehead.  States her vision has been "different.  Denies vision loss but states she feels like she has "tunnel vision.  No blurriness.  Also endorsing photo and phonophobia.  States she had a migraine a long time ago and symptoms are somewhat similar.  Denies dizziness, fever, and nuchal rigidity.   Headache      Home Medications Prior to Admission medications   Medication Sig Start Date End Date Taking? Authorizing Provider  Acetaminophen (TYLENOL PO) Take by mouth as needed.    [provider]  Calcium Carbonate (CALCIUM 500 PO) Take by mouth daily. With 5,000 units of vitamin D    [provider]  cholecalciferol (VITAMIN D) 1000 UNITS tablet Take 5,000 Units by mouth every morning.  Patient not taking: Reported on 10/22/2022    [provider]  cyclobenzaprine (FLEXERIL) 10 MG tablet TAKE ONE TABLET BY MOUTH ONCE DAILY AT BEDTIME Patient taking differently: as needed. 10/01/17   Pollyann Savoy, MD  diphenhydramine-acetaminophen (TYLENOL PM) 25-500 MG TABS tablet Take 1 tablet by mouth at bedtime as needed.    [provider]  docusate sodium (COLACE) 100 MG capsule Take 300 mg by mouth daily. For constipation Patient not taking: Reported on 10/22/2022    [provider]  FENUGREEK PO Take by mouth  daily. Patient not taking: Reported on 05/07/2022    [provider]  ferrous sulfate 325 (65 FE) MG tablet Take 325 mg by mouth 2 (two) times daily with a meal.  Patient not taking: Reported on 10/22/2022    [provider]  ibuprofen (ADVIL,MOTRIN) 600 MG tablet Take 1 tablet (600 mg total) by mouth every 6 (six) hours. Patient taking differently: Take 600 mg by mouth as needed. 09/22/17   Dale [place], FNP  levOCARNitine (CARNITINE PO) Take by mouth as needed.    [provider]  MAGNESIUM PO Take by mouth daily.    [provider]  methocarbamol (ROBAXIN) 500 MG tablet Take 1 tablet (500 mg total) by mouth 2 (two) times daily as needed for muscle spasms. 10/07/17   Pollyann Savoy, MD  Multiple Vitamin (MULTIVITAMIN PO) Take by mouth daily. Patient not taking: Reported on 10/22/2022    [provider]  oxyCODONE-acetaminophen (PERCOCET/ROXICET) 5-325 MG tablet Take 1 tablet by mouth every 6 (six) hours as needed (pain scale 4-7). 10/24/17   Griselda Miner, MD  Prenatal Vit-Fe Fumarate-FA (PRENATAL MULTIVITAMIN) TABS Take 1 tablet by mouth every morning. Patient not taking: Reported on 05/07/2022    [provider]  Probiotic Product (PROBIOTIC PO) Take by mouth daily.    [provider]  VITAMIN D PO Take 5,000 Units by mouth. With vitamin K    [provider]  polysaccharide iron complex (NIFEREX)  100 MG/5ML elixir Take by mouth daily.   04/12/11  [provider]      Allergies    Patient has no known allergies.    Review of Systems   Review of Systems  Neurological:  Positive for headaches.    Physical Exam   Vitals:   11/02/22 0230 11/02/22 0400  BP: 120/80   Pulse: 81 90  Resp:    Temp:    SpO2: 100% 100%    CONSTITUTIONAL:  well-appearing, NAD NEURO:  GCS 15. Speech is goal oriented. No deficits appreciated to CN III-XII; symmetric eyebrow raise, no facial drooping, tongue midline. Patient has  equal grip strength bilaterally with 5/5 strength against resistance in all major muscle groups bilaterally. Sensation to light touch intact. Patient moves extremities without ataxia. Normal finger-nose-finger. Patient ambulatory with steady gait. EYES:  eyes equal and reactive ENT/NECK:  Supple, no stridor  CARDIO:  regular rate and rhythm, appears well-perfused  PULM:  No respiratory distress, CTAB GI/GU:  non-distended, soft MSK/SPINE:  No gross deformities, no edema, moves all extremities  SKIN:  no rash, atraumatic  *Additional and/or pertinent findings included in MDM below   ED Results / Procedures / Treatments   Labs (all labs ordered are listed, but only abnormal results are displayed) Labs Reviewed  BASIC METABOLIC PANEL - Abnormal; Notable for the following components:      Result Value   BUN 5 (*)    All other components within normal limits  CBC WITH DIFFERENTIAL/PLATELET - Abnormal; Notable for the following components:   WBC 11.4 (*)    Neutro Abs 8.1 (*)    All other components within normal limits    EKG None  Radiology CT Head Wo Contrast  Result Date: 11/01/2022 CLINICAL DATA:  Headache, neuro deficit. EXAM: CT HEAD WITHOUT CONTRAST TECHNIQUE: Contiguous axial images were obtained from the base of the skull through the vertex without intravenous contrast. RADIATION DOSE REDUCTION: This exam was performed according to the departmental dose-optimization program which includes automated exposure control, adjustment of the mA and/or kV according to patient size and/or use of iterative reconstruction technique. COMPARISON:  Head CT 11/02/2003. FINDINGS: Brain: No acute intracranial hemorrhage. Gray-white differentiation is preserved. No hydrocephalus or extra-axial collection. No mass effect or midline shift. Vascular: No hyperdense vessel or unexpected calcification. Skull: No calvarial fracture or suspicious bone lesion. Skull base is unremarkable. Sinuses/Orbits: No  acute finding. Other: None. IMPRESSION: No acute intracranial abnormality. Electronically Signed   By: Orvan Falconer M.D.   On: 11/01/2022 20:42    Procedures Procedures    Medications Ordered in ED Medications  sodium chloride 0.9 % bolus 1,000 mL (0 mLs Intravenous Stopped 11/02/22 0328)  ketorolac (TORADOL) 15 MG/ML injection 15 mg (15 mg Intravenous Given 11/02/22 0246)  metoCLOPramide (REGLAN) injection 10 mg (10 mg Intravenous Given 11/02/22 0333)  dexamethasone (DECADRON) injection 10 mg (10 mg Intravenous Given 11/02/22 0248)    ED Course/ Medical Decision Making/ A&P                                 Medical Decision Making Risk Prescription drug management.   42 year old well-appearing female presenting for headache and visual disturbance.  Exam was unremarkable.  DDx includes stroke, meningitis, migraine, SAH, hypertensive emergency, other.  Overall patient appears clinically well, no acute distress and hemodynamically stable.  Overall suspect migraine with aura.  CT was negative for acute findings.  Low suspicion for [facility] or stroke given reassuring CT and no focal deficits on exam.  Doubt hypertensive emergency given reassuring blood pressure.  Treated with headache cocktail.  On reassessment patient stated that her headache had improved "immensely".  Discussed treatment at home should she have another what is likely a migraine.  Discussed return precautions.  Advised to follow-up PCP.  Vital stable.  Discharged home in good condition.        Final Clinical Impression(s) / ED Diagnoses Final diagnoses:  Nonintractable headache, unspecified chronicity pattern, unspecified headache type    Rx / DC Orders ED Discharge Orders     None         Gareth Eagle, PA-C 11/02/22 Vicenta Aly, MD 11/02/22 [phone]

## 2022-11-06 ENCOUNTER — Ambulatory Visit: Payer: Medicaid Other | Admitting: Physician Assistant

## 2022-11-19 ENCOUNTER — Ambulatory Visit: Payer: Medicaid Other

## 2022-11-19 NOTE — Progress Notes (Signed)
No charge replacement from previous orthotics that have not fit correctly  Then will send last pair patient is stillwearing out to refurbish  Marcia Bowers

## 2023-01-04 ENCOUNTER — Ambulatory Visit (HOSPITAL_COMMUNITY)
Admission: EM | Admit: 2023-01-04 | Discharge: 2023-01-04 | Disposition: A | Discharge status: Home / Self Care | Payer: Medicaid Other | Attending: Emergency Medicine | Admitting: Emergency Medicine

## 2023-01-04 ENCOUNTER — Other Ambulatory Visit: Payer: Self-pay

## 2023-01-04 ENCOUNTER — Encounter (HOSPITAL_COMMUNITY): Payer: Self-pay

## 2023-01-04 ENCOUNTER — Ambulatory Visit (INDEPENDENT_AMBULATORY_CARE_PROVIDER_SITE_OTHER): Payer: Medicaid Other

## 2023-01-04 DIAGNOSIS — S6992XA Unspecified injury of left wrist, hand and finger(s), initial encounter: Secondary | ICD-10-CM

## 2023-01-04 MED ORDER — IBUPROFEN 800 MG PO TABS: 800.0000 mg | ORAL_TABLET | Freq: Three times a day (TID) | ORAL | 0 refills | Status: AC

## 2023-01-04 MED ORDER — IBUPROFEN 800 MG PO TABS
ORAL_TABLET | ORAL | Status: AC
  Filled 2023-01-04: qty 1

## 2023-01-04 MED ORDER — IBUPROFEN 800 MG PO TABS
800.0000 mg | ORAL_TABLET | Freq: Once | ORAL | Status: AC
  Administered 2023-01-04: 800 mg via ORAL

## 2023-01-04 NOTE — ED Triage Notes (Signed)
Pt is here with a left wrist injury that started 3 days ago but fell ice skating 3 weeks ago andno pain was noticed til 3 days ago. Pt has taken OTC meds to relieve discomfort.

## 2023-01-04 NOTE — ED Provider Notes (Signed)
[facility]    CSN: [id number] Arrival date & time: 01/04/23  1525      History   Chief Complaint Chief Complaint  Patient presents with   Wrist Injury    HPI Marcia Bowers is a 42 y.o. female.  3 weeks ago she fell ice skating and landed on her left wrist She was not having any pain until 3 days ago Today pain is rated 3/10 Has tried***  No prior injury  Past Medical History:  Diagnosis Date   Abnormal Pap smear    Adult attention deficit disorder    per patient   Anemia    Bursitis    BV (bacterial vaginosis)    Chlamydia    Fibroids    Fibromyalgia    Gonorrhea    History of chicken pox    Tendonitis    Trichomonas    Vaginal Pap smear, abnormal    Yeast infection     Patient Active Problem List   Diagnosis Date Noted   Normal postpartum course 09/22/2017   PROM (premature rupture of membranes) 09/18/2017   Primary osteoarthritis of both knees 11/05/2016   Myofascial pain syndrome 03/19/2016   Other chronic pain 03/19/2016   Other fatigue 03/19/2016   Myalgia 03/19/2016   Pain of both hip joints 03/19/2016   Arthritis, senescent 03/19/2016   Arthralgia of both knees 03/19/2016   History of vitamin D deficiency 03/19/2016   BV (bacterial vaginosis)    Yeast infection    Trichomoniasis    Chlamydia    Gonorrhea    Fibroids    Umbilical hernia 01/24/2011   UNSPECIFIED VITAMIN D DEFICIENCY 06/03/2008   ABNORMAL PAP SMEAR, LGSIL 08/21/2007   BACK PAIN, LUMBAR 07/30/2007   BACK PAIN, UPPER 07/30/2007   MIGRAINE HEADACHE 07/02/2007   DENTAL CARIES 07/02/2007   LEG PAIN, CHRONIC 07/02/2007    Past Surgical History:  Procedure Laterality Date   CESAREAN SECTION  04/13/2011   Procedure: CESAREAN SECTION;  Surgeon: Purcell Nails, MD;  Location: [facility];  Service: Gynecology;  Laterality: N/A;  Primary cesarean section with delivery of baby girl at 42. Apgars 8/9.   CESAREAN SECTION N/A 09/19/2017   Procedure: CESAREAN SECTION;   Surgeon: Essie Hart, MD;  Location: [facility];  Service: Obstetrics;  Laterality: N/A;   INSERTION OF MESH N/A 10/24/2017   Procedure: INSERTION OF MESH;  Surgeon: Griselda Miner, MD;  Location: [facility];  Service: General;  Laterality: N/A;   TUBAL LIGATION Bilateral 09/19/2017   Procedure: BILATERAL TUBAL LIGATION;  Surgeon: Essie Hart, MD;  Location: [facility];  Service: Obstetrics;  Laterality: Bilateral;   UMBILICAL HERNIA REPAIR N/A 10/24/2017   Procedure: UMBILICAL HERNIA REPAIR ERAS PATHWAY;  Surgeon: Griselda Miner, MD;  Location: [facility];  Service: General;  Laterality: N/A;   WISDOM TOOTH EXTRACTION      OB History     Gravida  2   Para  2   Term  1   Preterm  1   AB      Living  2      SAB      IAB      Ectopic      Multiple  0   Live Births  2            Home Medications    Prior to Admission medications   Medication Sig Start Date End Date Taking? Authorizing Provider  ibuprofen (  ADVIL) 800 MG tablet Take 1 tablet (800 mg total) by mouth 3 (three) times daily. 01/04/23  Yes Rivaan Kendall, Lurena Joiner, PA-C  Acetaminophen (TYLENOL PO) Take by mouth as needed.    [provider]  Calcium Carbonate (CALCIUM 500 PO) Take by mouth daily. With 5,000 units of vitamin D    [provider]  cyclobenzaprine (FLEXERIL) 10 MG tablet TAKE ONE TABLET BY MOUTH ONCE DAILY AT BEDTIME Patient taking differently: as needed. 10/01/17   Pollyann Savoy, MD  diphenhydramine-acetaminophen (TYLENOL PM) 25-500 MG TABS tablet Take 1 tablet by mouth at bedtime as needed.    [provider]  levOCARNitine (CARNITINE PO) Take by mouth as needed.    [provider]  MAGNESIUM PO Take by mouth daily.    [provider]  methocarbamol (ROBAXIN) 500 MG tablet Take 1 tablet (500 mg total) by mouth 2 (two) times daily as needed for muscle spasms. 10/07/17   Pollyann Savoy, MD   oxyCODONE-acetaminophen (PERCOCET/ROXICET) 5-325 MG tablet Take 1 tablet by mouth every 6 (six) hours as needed (pain scale 4-7). 10/24/17   Griselda Miner, MD  Probiotic Product (PROBIOTIC PO) Take by mouth daily.    [provider]  VITAMIN D PO Take 5,000 Units by mouth. With vitamin K    [provider]  polysaccharide iron complex (NIFEREX) 100 MG/5ML elixir Take by mouth daily.   04/12/11  [provider]    Family History Family History  Problem Relation Age of Onset   Thyroid disease Mother    Hypertension Father    Heart disease Other        Heart on the right   Heart disease Paternal Grandfather    Hypertension Paternal Grandfather    Diabetes Paternal Grandfather    Heart disease Paternal Grandmother    Hypertension Paternal Grandmother    Healthy Sister    Healthy Brother    Healthy Daughter    Healthy Daughter    Anesthesia problems Neg Hx     Social History Social History   Tobacco Use   Smoking status: Never    Passive exposure: Never   Smokeless tobacco: Never  Vaping Use   Vaping status: Never Used  Substance Use Topics   Alcohol use: Yes    Comment: socially   Drug use: No     Allergies   Patient has no known allergies.   Review of Systems Review of Systems Per HPI  Physical Exam Triage Vital Signs ED Triage Vitals  Encounter Vitals Group     BP 01/04/23 1627 124/81     Systolic BP Percentile --      Diastolic BP Percentile --      Pulse Rate 01/04/23 1627 85     Resp 01/04/23 1627 17     Temp 01/04/23 1627 98.4 F (36.9 C)     Temp Source 01/04/23 1627 Oral     SpO2 01/04/23 1627 98 %     Weight --      Height --      Head Circumference --      Peak Flow --      Pain Score 01/04/23 1623 3     Pain Loc --      Pain Education --      Exclude from Growth Chart --    No data found.  Updated Vital Signs BP 124/81 (BP Location: Right Arm)   Pulse 85   Temp 98.4 F (36.9 C) (Oral)  Resp 17   LMP  01/03/2023 (Exact Date)   SpO2 98%     Physical Exam Vitals and nursing note reviewed.  Constitutional:      General: She is not in acute distress. HENT:     Mouth/Throat:     Pharynx: Oropharynx is clear.  Cardiovascular:     Rate and Rhythm: Normal rate and regular rhythm.     Pulses: Normal pulses.  Pulmonary:     Effort: Pulmonary effort is normal.  Musculoskeletal:     Left wrist: Tenderness present. Normal range of motion.     Cervical back: Normal range of motion.     Comments: Good ROM. Tender lateral wrist with light palpation. Strong radial pulse. Cap refill < 2 seconds, sensation intact distally  Skin:    Capillary Refill: Capillary refill takes less than 2 seconds.  Neurological:     Mental Status: She is alert and oriented to person, place, and time.      UC Treatments / Results  Labs (all labs ordered are listed, but only abnormal results are displayed) Labs Reviewed - No data to display  EKG   Radiology DG Wrist Complete Left Result Date: 01/04/2023 CLINICAL DATA:  Fall with wrist injury EXAM: LEFT WRIST - COMPLETE 3+ VIEW COMPARISON:  None Available. FINDINGS: There is no evidence of fracture or dislocation. There is no evidence of arthropathy or other focal bone abnormality. Soft tissues are unremarkable. IMPRESSION: Negative. Electronically Signed   By: Jasmine Pang M.D.   On: 01/04/2023 18:28    Procedures Procedures (including critical care time)  Medications Ordered in UC Medications  ibuprofen (ADVIL) tablet 800 mg (800 mg Oral Given 01/04/23 1810)    Initial Impression / Assessment and Plan / UC Course  I have reviewed the triage vital signs and the nursing notes.  Pertinent labs & imaging results that were available during my care of the patient were reviewed by me and considered in my medical decision making (see chart for details).  Left wrist x-ray negative Discussed soft tissue injury   Final Clinical Impressions(s) / UC  Diagnoses   Final diagnoses:  Injury of left wrist, initial encounter     Discharge Instructions      I do not see an abnormality on xray I will call you tomorrow if the radiologist sees something differently  Rest - try to avoid heavy lifting and high impact activity Ice - apply for 20 minutes a few times daily Compression - use wrist brace for support Elevation - prop up on a pillow Ibuprofen - 800 mg every 6 hours (with food)  Please follow up with orthopedics. They have walk-in hours or you can call to make an appointment.      ED Prescriptions     Medication Sig Dispense Auth. Provider   ibuprofen (ADVIL) 800 MG tablet Take 1 tablet (800 mg total) by mouth 3 (three) times daily. 21 tablet Cayson Kalb, Lurena Joiner, PA-C      PDMP not reviewed this encounter.

## 2023-01-04 NOTE — Discharge Instructions (Addendum)
I do not see an abnormality on xray I will call you tomorrow if the radiologist sees something differently  Rest - try to avoid heavy lifting and high impact activity Ice - apply for 20 minutes a few times daily Compression - use wrist brace for support Elevation - prop up on a pillow Ibuprofen - 800 mg every 6 hours (with food)  Please follow up with orthopedics. They have walk-in hours or you can call to make an appointment.

## 2023-01-10 ENCOUNTER — Ambulatory Visit: Payer: Medicaid Other

## 2023-01-10 NOTE — Progress Notes (Signed)
Patient is a lot happier with re-make will try and if happy will drop off old pair of orthotics to have re-furbished

## 2023-01-28 ENCOUNTER — Telehealth: Payer: Self-pay | Admitting: Podiatry

## 2023-01-28 NOTE — Telephone Encounter (Signed)
 Pt called and we scheduled her to come in to see Lolita for an orthotic adjustment. She is scheduled for 1/31 and was a little upset that she has to wait that long since the issue with the orthotics has been going on over a yr. She states she has left 2 messages and no one has called her back. I told her I would send a message to that dept and maybe they could work her in somewhere. I did put on waitlist as well.

## 2023-02-04 DIAGNOSIS — F322 Major depressive disorder, single episode, severe without psychotic features: Secondary | ICD-10-CM | POA: Insufficient documentation

## 2023-02-04 DIAGNOSIS — M329 Systemic lupus erythematosus, unspecified: Secondary | ICD-10-CM | POA: Diagnosis not present

## 2023-02-04 DIAGNOSIS — F419 Anxiety disorder, unspecified: Secondary | ICD-10-CM | POA: Diagnosis not present

## 2023-02-21 ENCOUNTER — Other Ambulatory Visit: Payer: Medicaid Other

## 2023-03-11 DIAGNOSIS — F419 Anxiety disorder, unspecified: Secondary | ICD-10-CM | POA: Diagnosis not present

## 2023-03-11 DIAGNOSIS — Z8639 Personal history of other endocrine, nutritional and metabolic disease: Secondary | ICD-10-CM | POA: Diagnosis not present

## 2023-03-11 DIAGNOSIS — E079 Disorder of thyroid, unspecified: Secondary | ICD-10-CM | POA: Diagnosis not present

## 2023-03-11 DIAGNOSIS — R490 Dysphonia: Secondary | ICD-10-CM | POA: Diagnosis not present

## 2023-03-11 DIAGNOSIS — Z79899 Other long term (current) drug therapy: Secondary | ICD-10-CM | POA: Diagnosis not present

## 2023-03-25 DIAGNOSIS — Z1239 Encounter for other screening for malignant neoplasm of breast: Secondary | ICD-10-CM | POA: Diagnosis not present

## 2023-03-25 DIAGNOSIS — R221 Localized swelling, mass and lump, neck: Secondary | ICD-10-CM | POA: Diagnosis not present

## 2023-03-25 DIAGNOSIS — Z0000 Encounter for general adult medical examination without abnormal findings: Secondary | ICD-10-CM | POA: Diagnosis not present

## 2023-03-25 DIAGNOSIS — Z113 Encounter for screening for infections with a predominantly sexual mode of transmission: Secondary | ICD-10-CM | POA: Diagnosis not present

## 2023-03-25 DIAGNOSIS — Z124 Encounter for screening for malignant neoplasm of cervix: Secondary | ICD-10-CM | POA: Diagnosis not present

## 2023-04-03 ENCOUNTER — Telehealth: Payer: Self-pay

## 2023-04-03 NOTE — Telephone Encounter (Signed)
 Called pt asked her to call me back in the voicemail I left so we can speak about her orthos if she wants to pick them up or make an appt.

## 2023-04-03 NOTE — Telephone Encounter (Signed)
 Called PT to let her know she can pick up her orthotics, we set an appt just in case she needs an adjustment.

## 2023-04-09 ENCOUNTER — Telehealth: Payer: Self-pay | Admitting: Podiatry

## 2023-04-09 NOTE — Telephone Encounter (Signed)
 Patient called requesting to speak to the office or Engineer, manufacturing. Patient states she has been trying to get orthotics through our practice for a year and she still has not received the right orthoticvs. She states she has had so many issues and she is not sure why. Patint would like a call back for follow up on this matter.Pts number [phone].

## 2023-04-10 ENCOUNTER — Ambulatory Visit

## 2023-04-10 DIAGNOSIS — R0982 Postnasal drip: Secondary | ICD-10-CM | POA: Diagnosis not present

## 2023-04-10 DIAGNOSIS — R490 Dysphonia: Secondary | ICD-10-CM | POA: Diagnosis not present

## 2023-04-10 NOTE — Progress Notes (Signed)
 Inserts were heated up and arches dropped patient states they feel more comfortable  Old inserts sent to [facility] to refurbish no charge

## 2023-04-10 NOTE — Telephone Encounter (Signed)
 Inserts were heated up and arches dropped patient states they feel more comfortable  Old inserts sent to [facility] to refurbish no charge

## 2023-04-11 NOTE — Progress Notes (Signed)
 Office Visit Note  Patient: Marcia Bowers             Date of Birth: 1980-12-29           MRN: [id number]             PCP: Dani Gobble, PA-C Referring: Fransisca Kaufmann* Visit Date: 04/25/2023 Occupation: @GUAROCC @  Subjective:  Myofascial pain  History of Present Illness: Marcia Bowers is a 43 y.o. female with history of myofascial pain and osteoarthritis.  Patient continues to experience intermittent myalgias and muscle tenderness due to myofascial pain.  She takes Tylenol as needed and occasionally will take ibuprofen if she notices increased inflammation.  Overall her symptoms have been more manageable.  She takes methocarbamol sparingly for muscle spasms.  She recently was experiencing muscle twitching which she attributed to drinking alkaline water.  Since switching back to normal water with also twitching has resolved.  Patient has been started on Prozac and has been working on getting more restorative sleep at night which she feels has helped with her daytime drowsiness and fatigue. Patient states that her SI joint pain has been manageable.  She had a right SI joint cortisone injection performed on 10/22/2022 which righted significant relief.  She takes ibuprofen if she has an exacerbation of symptoms.  She does not need a repeat injection today.   Activities of Daily Living:  Patient reports morning stiffness for 5 minutes.   Patient Reports nocturnal pain.  Difficulty dressing/grooming: Denies Difficulty climbing stairs: Reports Difficulty getting out of chair: Denies Difficulty using hands for taps, buttons, cutlery, and/or writing: Reports  Review of Systems  Constitutional:  Positive for fatigue.  HENT:  Negative for mouth sores and mouth dryness.   Eyes:  Negative for pain and dryness.  Respiratory:  Negative for shortness of breath and difficulty breathing.   Cardiovascular:  Negative for chest pain and palpitations.  Gastrointestinal:  Positive  for constipation. Negative for blood in stool and diarrhea.  Endocrine: Negative for increased urination.  Genitourinary:  Negative for involuntary urination.  Musculoskeletal:  Positive for joint pain, joint pain, joint swelling, myalgias, muscle weakness, morning stiffness, muscle tenderness and myalgias. Negative for gait problem.  Skin:  Positive for color change. Negative for rash, hair loss and sensitivity to sunlight.  Allergic/Immunologic: Negative for susceptible to infections.  Neurological:  Positive for numbness and headaches. Negative for dizziness.  Hematological:  Positive for swollen glands.  Psychiatric/Behavioral:  Negative for depressed mood and sleep disturbance. The patient is not nervous/anxious.     PMFS History:  Patient Active Problem List   Diagnosis Date Noted   Normal postpartum course 09/22/2017   PROM (premature rupture of membranes) 09/18/2017   Primary osteoarthritis of both knees 11/05/2016   Myofascial pain syndrome 03/19/2016   Other chronic pain 03/19/2016   Other fatigue 03/19/2016   Myalgia 03/19/2016   Pain of both hip joints 03/19/2016   Arthritis, senescent 03/19/2016   Arthralgia of both knees 03/19/2016   History of vitamin D deficiency 03/19/2016   BV (bacterial vaginosis)    Yeast infection    Trichomoniasis    Chlamydia    Gonorrhea    Fibroids    Umbilical hernia 01/24/2011   UNSPECIFIED VITAMIN D DEFICIENCY 06/03/2008   ABNORMAL PAP SMEAR, LGSIL 08/21/2007   BACK PAIN, LUMBAR 07/30/2007   BACK PAIN, UPPER 07/30/2007   MIGRAINE HEADACHE 07/02/2007   DENTAL CARIES 07/02/2007   LEG PAIN, CHRONIC 07/02/2007  Past Medical History:  Diagnosis Date   Abnormal Pap smear    Adult attention deficit disorder    per patient   Anemia    Bursitis    BV (bacterial vaginosis)    Chlamydia    Fibroids    Fibromyalgia    Gonorrhea    History of chicken pox    Tendonitis    Trichomonas    Vaginal Pap smear, abnormal    Yeast  infection     Family History  Problem Relation Age of Onset   Thyroid disease Mother    Hypertension Father    Heart disease Other        Heart on the right   Heart disease Paternal Grandfather    Hypertension Paternal Grandfather    Diabetes Paternal Grandfather    Heart disease Paternal Grandmother    Hypertension Paternal Grandmother    Healthy Sister    Healthy Brother    Healthy Daughter    Healthy Daughter    Anesthesia problems Neg Hx    Past Surgical History:  Procedure Laterality Date   CESAREAN SECTION  04/13/2011   Procedure: CESAREAN SECTION;  Surgeon: Purcell Nails, MD;  Location: [facility];  Service: Gynecology;  Laterality: N/A;  Primary cesarean section with delivery of baby girl at 77. Apgars 8/9.   CESAREAN SECTION N/A 09/19/2017   Procedure: CESAREAN SECTION;  Surgeon: Essie Hart, MD;  Location: [facility];  Service: Obstetrics;  Laterality: N/A;   INSERTION OF MESH N/A 10/24/2017   Procedure: INSERTION OF MESH;  Surgeon: Griselda Miner, MD;  Location: [facility];  Service: General;  Laterality: N/A;   TUBAL LIGATION Bilateral 09/19/2017   Procedure: BILATERAL TUBAL LIGATION;  Surgeon: Essie Hart, MD;  Location: [facility];  Service: Obstetrics;  Laterality: Bilateral;   UMBILICAL HERNIA REPAIR N/A 10/24/2017   Procedure: UMBILICAL HERNIA REPAIR ERAS PATHWAY;  Surgeon: Chevis Pretty III, MD;  Location: [facility];  Service: General;  Laterality: N/A;   WISDOM TOOTH EXTRACTION     Social History   Social History Narrative   Not on file   Immunization History  Administered Date(s) Administered   Td 07/30/2007   Tdap 04/16/2011     Objective: Vital Signs: BP 102/71 (BP Location: Left Arm, Patient Position: Sitting, Cuff Size: Normal)   Pulse 83   Resp 16   Ht 5\' 3"  (1.6 m)   Wt 140 lb (63.5 kg)   BMI 24.80 kg/m    Physical Exam Vitals and nursing note reviewed.  Constitutional:      Appearance: She is  well-developed.  HENT:     Head: Normocephalic and atraumatic.  Eyes:     Conjunctiva/sclera: Conjunctivae normal.  Cardiovascular:     Rate and Rhythm: Normal rate and regular rhythm.     Heart sounds: Normal heart sounds.  Pulmonary:     Effort: Pulmonary effort is normal.     Breath sounds: Normal breath sounds.  Abdominal:     General: Bowel sounds are normal.     Palpations: Abdomen is soft.  Musculoskeletal:     Cervical back: Normal range of motion.  Lymphadenopathy:     Cervical: No cervical adenopathy.  Skin:    General: Skin is warm and dry.     Capillary Refill: Capillary refill takes less than 2 seconds.  Neurological:     Mental Status: She is alert and oriented to person, place, and time.  Psychiatric:  Behavior: Behavior normal.      Musculoskeletal Exam: C-spine, thoracic spine, lumbar spine have good range of motion.  Trapezius muscle tension and tenderness bilaterally.  No midline spinal tenderness.  No SI joint tenderness.  Shoulder joints, elbow joints, wrist joints MCPs and PIPs, DIPs have good range of motion with no synovitis. Tenderness of the left wrist. Complete fist formation bilaterally.  Hip joints have good ROM with no groin pain.  Knee joints have good ROM with no warmth or effusion.  Ankle joints have good ROM with no tenderness or joint swelling.   CDAI Exam: CDAI Score: -- Patient Global: --; Provider Global: -- Swollen: --; Tender: -- Joint Exam 04/25/2023   No joint exam has been documented for this visit   There is currently no information documented on the homunculus. Go to the Rheumatology activity and complete the homunculus joint exam.  Investigation: No additional findings.  Imaging: No results found.  Recent Labs: Lab Results  Component Value Date   WBC 11.4 (H) 11/01/2022   HGB 13.1 11/01/2022   PLT 375 11/01/2022   NA 138 11/01/2022   K 4.3 11/01/2022   CL 101 11/01/2022   CO2 26 11/01/2022   GLUCOSE 93  11/01/2022   BUN 5 (L) 11/01/2022   CREATININE 0.82 11/01/2022   BILITOT 0.9 03/21/2016   ALKPHOS 39 03/21/2016   AST 18 03/21/2016   ALT 12 03/21/2016   PROT 6.9 03/21/2016   ALBUMIN 3.9 03/21/2016   CALCIUM 9.9 11/01/2022   GFRAA >89 03/21/2016    Speciality Comments: No specialty comments available.  Procedures:  No procedures performed Allergies: Patient has no known allergies.   Assessment / Plan:     Visit Diagnoses: Myofascial pain syndrome: She experiences intermittent myalgias and muscle tenderness consistent with myofascial pain.  She has trapezius muscle tension and tenderness on examination today.  She takes methocarbamol sparingly for muscle spasm relief.  She was recently started on Prozac which has helped with her mood as well as sleep cycle.  She has been sleeping better at night which she feels has been helping with her energy level.  Discussed the importance of regular exercise and good sleep hygiene. She does not need any injections today.  No refills needed at this time.  She will follow-up in the office in 6 months or sooner if needed.  Other chronic pain: She has been taking Tylenol and/or ibuprofen as needed for pain relief.  Other fatigue: Her energy level has improved since she has started to sleep better at night.  She has been working on good sleep hygiene and has also noticed better sleep since initiating Prozac.  Lateral epicondylitis of both elbows: Not currently symptomatic.  Primary osteoarthritis of both knees: She has good range of motion of both knee joints on examination today.  No warmth or effusion noted.  Trochanteric bursitis of both hips: Intermittent discomfort.  Discussed the importance of performing daily stretching exercises.  Chronic SI joint pain: She had a right SI joint cortisone injection performed on 10/22/2022 which reported significant relief.  Her symptoms have been manageable recently.  No SI joint tenderness upon palpation today.   She has been taking ibuprofen as needed for pain relief.  She declined a repeat cortisone injection today.  She will notify us if her symptoms persist or worsen.  Plantar fasciitis, bilateral: Not currently symptomatic.   Other medical conditions are listed as follows:   History of vitamin D deficiency: She is taking vitamin D  5000 units daily.   History of migraine  Orders: No orders of the defined types were placed in this encounter.  No orders of the defined types were placed in this encounter.    Follow-Up Instructions: Return in about 6 months (around 10/25/2023) for Myofascial pain, OA.   Gearldine Bienenstock, PA-C  Note - This record has been created using Dragon software.  Chart creation errors have been sought, but may not always  have been located. Such creation errors do not reflect on  the standard of medical care.

## 2023-04-15 DIAGNOSIS — R300 Dysuria: Secondary | ICD-10-CM | POA: Diagnosis not present

## 2023-04-15 DIAGNOSIS — B3731 Acute candidiasis of vulva and vagina: Secondary | ICD-10-CM | POA: Diagnosis not present

## 2023-04-15 DIAGNOSIS — N898 Other specified noninflammatory disorders of vagina: Secondary | ICD-10-CM | POA: Diagnosis not present

## 2023-04-22 ENCOUNTER — Telehealth: Payer: Self-pay | Admitting: Podiatry

## 2023-04-22 NOTE — Telephone Encounter (Signed)
 Pt has been calling since 1/7 and recently worked with Nicki Guadalajara around 3/13-3/19. She is now asking to speak with the supervisor. Stated that she has asked several times w/ messages left and no return call. She stated that she was just transferred to the supervisor, no one answered the phone, it kept ringing until it disconnected her.

## 2023-04-25 ENCOUNTER — Ambulatory Visit: Payer: Self-pay | Attending: Rheumatology | Admitting: Physician Assistant

## 2023-04-25 ENCOUNTER — Encounter: Payer: Self-pay | Admitting: Physician Assistant

## 2023-04-25 VITALS — BP 102/71 | HR 83 | Resp 16 | Ht 63.0 in | Wt 140.0 lb

## 2023-04-25 DIAGNOSIS — M533 Sacrococcygeal disorders, not elsewhere classified: Secondary | ICD-10-CM | POA: Diagnosis not present

## 2023-04-25 DIAGNOSIS — M7712 Lateral epicondylitis, left elbow: Secondary | ICD-10-CM | POA: Diagnosis not present

## 2023-04-25 DIAGNOSIS — Z8639 Personal history of other endocrine, nutritional and metabolic disease: Secondary | ICD-10-CM | POA: Diagnosis not present

## 2023-04-25 DIAGNOSIS — M7062 Trochanteric bursitis, left hip: Secondary | ICD-10-CM

## 2023-04-25 DIAGNOSIS — Z8669 Personal history of other diseases of the nervous system and sense organs: Secondary | ICD-10-CM | POA: Diagnosis not present

## 2023-04-25 DIAGNOSIS — M7711 Lateral epicondylitis, right elbow: Secondary | ICD-10-CM

## 2023-04-25 DIAGNOSIS — G8929 Other chronic pain: Secondary | ICD-10-CM | POA: Diagnosis not present

## 2023-04-25 DIAGNOSIS — M722 Plantar fascial fibromatosis: Secondary | ICD-10-CM

## 2023-04-25 DIAGNOSIS — M170 Bilateral primary osteoarthritis of knee: Secondary | ICD-10-CM

## 2023-04-25 DIAGNOSIS — M7061 Trochanteric bursitis, right hip: Secondary | ICD-10-CM

## 2023-04-25 DIAGNOSIS — M7918 Myalgia, other site: Secondary | ICD-10-CM | POA: Diagnosis not present

## 2023-04-25 DIAGNOSIS — R5383 Other fatigue: Secondary | ICD-10-CM | POA: Diagnosis not present

## 2023-05-03 ENCOUNTER — Ambulatory Visit (HOSPITAL_COMMUNITY)
Admission: RE | Admit: 2023-05-03 | Discharge: 2023-05-03 | Disposition: A | Discharge status: Home / Self Care | Source: Ambulatory Visit | Attending: Internal Medicine | Admitting: Internal Medicine

## 2023-05-03 ENCOUNTER — Encounter (HOSPITAL_COMMUNITY): Payer: Self-pay

## 2023-05-03 VITALS — BP 102/67 | HR 85 | Temp 98.7°F | Resp 16

## 2023-05-03 DIAGNOSIS — M62830 Muscle spasm of back: Secondary | ICD-10-CM | POA: Diagnosis not present

## 2023-05-03 MED ORDER — KETOROLAC TROMETHAMINE 30 MG/ML IJ SOLN
INTRAMUSCULAR | Status: AC
  Filled 2023-05-03: qty 1

## 2023-05-03 MED ORDER — CYCLOBENZAPRINE HCL 10 MG PO TABS
10.0000 mg | ORAL_TABLET | Freq: Two times a day (BID) | ORAL | 0 refills | Status: AC | PRN
Start: 2023-05-03 — End: ?

## 2023-05-03 MED ORDER — KETOROLAC TROMETHAMINE 30 MG/ML IJ SOLN
30.0000 mg | Freq: Once | INTRAMUSCULAR | Status: AC
  Administered 2023-05-03: 30 mg via INTRAMUSCULAR

## 2023-05-03 MED ORDER — DEXAMETHASONE SODIUM PHOSPHATE 10 MG/ML IJ SOLN
10.0000 mg | Freq: Once | INTRAMUSCULAR | Status: AC
  Administered 2023-05-03: 10 mg via INTRAMUSCULAR

## 2023-05-03 MED ORDER — DEXAMETHASONE SODIUM PHOSPHATE 10 MG/ML IJ SOLN
INTRAMUSCULAR | Status: AC
  Filled 2023-05-03: qty 1

## 2023-05-03 NOTE — Discharge Instructions (Addendum)
 Thoracic and lumbar paraspinous muscle spasms.  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  Has had similar symptoms in the past that were successfully treated with an injection medication that was given at urgent care.  We will treat with the following: Toradol injection given today. This is a medication to help with pain. This is not a narcotic.  Decadron injection given today. This is a steroid to help with inflammation and pain. Flexeril 10 mg every 12 hours as needed for muscle spasms.  Use caution as this medication can cause drowsiness.  Light stretching to help with spasms Heating pad to the area as needed to help with pain. Do not apply heat directly to the skin Return to urgent care or PCP if symptoms worsen or fail to resolve.

## 2023-05-03 NOTE — ED Triage Notes (Signed)
 Patient here today with c/o LB pain since Thursday. She has been taking some old Hydrocodone and cyclobenzaprine with some relief.

## 2023-05-03 NOTE — ED Provider Notes (Signed)
 [facility]    CSN: [id number] Arrival date & time: 05/03/23  1518      History   Chief Complaint Chief Complaint  Patient presents with   Back Pain    HPI Marcia Bowers is a 43 y.o. female.   43 year old female who presents urgent care with complaints of left back pain.  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  Thursday she woke up with a knot and severe spasms under her shoulder blade extending down to her lower back.  The spasm is severe.  She has tried taking Flexeril and some hydrocodone that she had but it has not helped.  She is having to walk awkwardly to avoid pain.  She denies any injury to the area and has not had any lifting recently.  She has a history of fibromyalgia and "borderline lupus" and has had to see her rheumatologist for sciatica in the past.  She is not having any radicular pain.  She is not having loss of bowel or bladder.    Back Pain Associated symptoms: no abdominal pain, no chest pain, no dysuria and no fever     Past Medical History:  Diagnosis Date   Abnormal Pap smear    Adult attention deficit disorder    per patient   Anemia    Bursitis    BV (bacterial vaginosis)    Chlamydia    Fibroids    Fibromyalgia    Gonorrhea    History of chicken pox    Tendonitis    Trichomonas    Vaginal Pap smear, abnormal    Yeast infection     Patient Active Problem List   Diagnosis Date Noted   Normal postpartum course 09/22/2017   PROM (premature rupture of membranes) 09/18/2017   Primary osteoarthritis of both knees 11/05/2016   Myofascial pain syndrome 03/19/2016   Other chronic pain 03/19/2016   Other fatigue 03/19/2016   Myalgia 03/19/2016   Pain of both hip joints 03/19/2016   Arthritis, senescent 03/19/2016   Arthralgia of both knees 03/19/2016   History of vitamin D deficiency 03/19/2016    BV (bacterial vaginosis)    Yeast infection    Trichomoniasis    Chlamydia    Gonorrhea    Fibroids    Umbilical hernia 01/24/2011   UNSPECIFIED VITAMIN D DEFICIENCY 06/03/2008   ABNORMAL PAP SMEAR, LGSIL 08/21/2007   BACK PAIN, LUMBAR 07/30/2007   BACK PAIN, UPPER 07/30/2007   MIGRAINE HEADACHE 07/02/2007   DENTAL CARIES 07/02/2007   LEG PAIN, CHRONIC 07/02/2007    Past Surgical History:  Procedure Laterality Date   CESAREAN SECTION  04/13/2011   Procedure: CESAREAN SECTION;  Surgeon: Madelene Schanz, MD;  Location: [facility];  Service: Gynecology;  Laterality: N/A;  Primary cesarean section with delivery of baby girl at 73. Apgars 8/9.   CESAREAN SECTION N/A 09/19/2017   Procedure: CESAREAN SECTION;  Surgeon: Artemisa Bile, MD;  Location: [facility];  Service: Obstetrics;  Laterality: N/A;   INSERTION OF MESH N/A 10/24/2017   Procedure: INSERTION OF MESH;  Surgeon: Caralyn Chandler, MD;  Location: [facility];  Service: General;  Laterality: N/A;   TUBAL LIGATION Bilateral 09/19/2017   Procedure: BILATERAL TUBAL LIGATION;  Surgeon: Artemisa Bile, MD;  Location: [facility];  Service: Obstetrics;  Laterality: Bilateral;   UMBILICAL HERNIA  REPAIR N/A 10/24/2017   Procedure: UMBILICAL HERNIA REPAIR ERAS PATHWAY;  Surgeon: Caralyn Chandler, MD;  Location: [facility];  Service: General;  Laterality: N/A;   WISDOM TOOTH EXTRACTION      OB History     Gravida  2   Para  2   Term  1   Preterm  1   AB      Living  2      SAB      IAB      Ectopic      Multiple  0   Live Births  2            Home Medications    Prior to Admission medications   Medication Sig Start Date End Date Taking? Authorizing Provider  Acetaminophen (TYLENOL PO) Take by mouth as needed.    [provider]  Calcium Carbonate (CALCIUM 500 PO) Take by mouth daily. With 5,000 units of vitamin D    [provider]  cyclobenzaprine  (FLEXERIL) 10 MG tablet TAKE ONE TABLET BY MOUTH ONCE DAILY AT BEDTIME Patient taking differently: as needed. 10/01/17   Nicholas Bari, MD  diphenhydramine-acetaminophen (TYLENOL PM) 25-500 MG TABS tablet Take 1 tablet by mouth at bedtime as needed.    [provider]  FLUoxetine (PROZAC) 10 MG capsule Take 1 capsule by mouth daily. 03/11/23   [provider]  ibuprofen (ADVIL) 800 MG tablet Take 1 tablet (800 mg total) by mouth 3 (three) times daily. Patient taking differently: Take 800 mg by mouth as needed. 01/04/23   Rising, Ivette Marks, PA-C  levOCARNitine (CARNITINE PO) Take by mouth as needed. Patient not taking: Reported on 04/25/2023    [provider]  MAGNESIUM PO Take by mouth daily. Patient not taking: Reported on 04/25/2023    [provider]  methocarbamol (ROBAXIN) 500 MG tablet Take 1 tablet (500 mg total) by mouth 2 (two) times daily as needed for muscle spasms. 10/07/17   Nicholas Bari, MD  oxyCODONE-acetaminophen (PERCOCET/ROXICET) 5-325 MG tablet Take 1 tablet by mouth every 6 (six) hours as needed (pain scale 4-7). 10/24/17   Caralyn Chandler, MD  Probiotic Product (PROBIOTIC PO) Take by mouth daily.    [provider]  VITAMIN D PO Take 5,000 Units by mouth. With vitamin K    [provider]  polysaccharide iron complex (NIFEREX) 100 MG/5ML elixir Take by mouth daily.   04/12/11  [provider]    Family History Family History  Problem Relation Age of Onset   Thyroid disease Mother    Hypertension Father    Heart disease Other        Heart on the right   Heart disease Paternal Grandfather    Hypertension Paternal Grandfather    Diabetes Paternal Grandfather    Heart disease Paternal Grandmother    Hypertension Paternal Grandmother    Healthy Sister    Healthy Brother    Healthy Daughter    Healthy Daughter    Anesthesia problems Neg Hx     Social History Social History   Tobacco Use   Smoking  status: Never    Passive exposure: Never   Smokeless tobacco: Never  Vaping Use   Vaping status: Never Used  Substance Use Topics   Alcohol use: Yes    Comment: socially   Drug use: No     Allergies   Patient has no known allergies.   Review of Systems Review of Systems  Constitutional:  Negative for chills and fever.  HENT:  Negative for ear pain and sore throat.   Eyes:  Negative for pain and visual disturbance.  Respiratory:  Negative for cough and shortness of breath.   Cardiovascular:  Negative for chest pain and palpitations.  Gastrointestinal:  Negative for abdominal pain and vomiting.  Genitourinary:  Negative for dysuria and hematuria.  Musculoskeletal:  Positive for back pain. Negative for arthralgias.  Skin:  Negative for color change and rash.  Neurological:  Negative for seizures and syncope.  All other systems reviewed and are negative.    Physical Exam Triage Vital Signs ED Triage Vitals  Encounter Vitals Group     BP 05/03/23 1541 102/67     Systolic BP Percentile --      Diastolic BP Percentile --      Pulse Rate 05/03/23 1541 85     Resp 05/03/23 1541 16     Temp 05/03/23 1541 98.7 F (37.1 C)     Temp Source 05/03/23 1541 Oral     SpO2 05/03/23 1541 98 %     Weight --      Height --      Head Circumference --      Peak Flow --      Pain Score 05/03/23 1543 8     Pain Loc --      Pain Education --      Exclude from Growth Chart --    No data found.  Updated Vital Signs BP 102/67 (BP Location: Left Arm)   Pulse 85   Temp 98.7 F (37.1 C) (Oral)   Resp 16   LMP 04/22/2023 (Exact Date)   SpO2 98%   Visual Acuity Right Eye Distance:   Left Eye Distance:   Bilateral Distance:    Right Eye Near:   Left Eye Near:    Bilateral Near:     Physical Exam Vitals and nursing note reviewed.  Constitutional:      General: She is not in acute distress.    Appearance: She is well-developed.  HENT:     Head: Normocephalic and atraumatic.   Eyes:     Conjunctiva/sclera: Conjunctivae normal.  Cardiovascular:     Rate and Rhythm: Normal rate and regular rhythm.     Heart sounds: No murmur heard. Pulmonary:     Effort: Pulmonary effort is normal. No respiratory distress.     Breath sounds: Normal breath sounds.  Abdominal:     Palpations: Abdomen is soft.     Tenderness: There is no abdominal tenderness.  Musculoskeletal:        General: No swelling.     Cervical back: Neck supple.     Thoracic back: Spasms and tenderness present.     Lumbar back: Spasms and tenderness present. Negative right straight leg raise test and negative left straight leg raise test.       Back:  Skin:    General: Skin is warm and dry.     Capillary Refill: Capillary refill takes less than 2 seconds.  Neurological:     Mental Status: She is alert.  Psychiatric:        Mood and Affect: Mood normal.      UC Treatments / Results  Labs (all labs ordered are listed, but only abnormal results are displayed) Labs Reviewed - No data to display  EKG   Radiology No results found.  Procedures Procedures (including critical care time)  Medications Ordered in UC Medications -  No data to display  Initial Impression / Assessment and Plan / UC Course  I have reviewed the triage vital signs and the nursing notes.  Pertinent labs & imaging results that were available during my care of the patient were reviewed by me and considered in my medical decision making (see chart for details).     Spasm of thoracic back muscle  Spasm of lumbar paraspinous muscle   Thoracic and lumbar paraspinous muscle spasms.  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  Has had similar symptoms in the past that were successfully treated with an injection medication that was given at urgent care.  We will treat with the following: Toradol injection given today. This is a medication to help with pain. This is not a narcotic.   Decadron injection given today. This is a steroid to help with inflammation and pain. Flexeril 10 mg every 12 hours as needed for muscle spasms.  Use caution as this medication can cause drowsiness.  Light stretching to help with spasms Heating pad to the area as needed to help with pain. Do not apply heat directly to the skin Return to urgent care or PCP if symptoms worsen or fail to resolve.    Final Clinical Impressions(s) / UC Diagnoses   Final diagnoses:  None   Discharge Instructions   None    ED Prescriptions   None    PDMP not reviewed this encounter.   Kreg Pesa, [place] 05/03/23 1625

## 2023-05-12 ENCOUNTER — Other Ambulatory Visit

## 2023-07-08 ENCOUNTER — Ambulatory Visit (INDEPENDENT_AMBULATORY_CARE_PROVIDER_SITE_OTHER)

## 2023-07-08 DIAGNOSIS — S90212A Contusion of left great toe with damage to nail, initial encounter: Secondary | ICD-10-CM | POA: Diagnosis not present

## 2023-07-08 DIAGNOSIS — S90112A Contusion of left great toe without damage to nail, initial encounter: Secondary | ICD-10-CM

## 2023-07-09 NOTE — Progress Notes (Signed)
 She presents today with a chief complaint of a nail injury to the hallux left.  States that she had just started her cruise and on the second day of her cruise there was a door opened against her toenail knocking her toenail off.  Thinking that her toe was broken the whole time could barely walk on it.  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  Providing her with dressing material.  She states that the toe is still very tender states that she can barely walk on it.  And she is seeking evaluation today of possible fracture and how to treat her nail bed.  She states that she was not able to enjoy her cruise she was unable to swim and to get in the water on her excursions.  Stating that it was just not a pleasant time.  Objective: Vital signs are stable she is alert and oriented x 3.  Pulses are palpable.  She has no remaining ecchymosis to the hallux left.  Nail plate is absent the nailbed appears to be healing in very nicely there is no signs of infection at this point.  Is still moderately tender to palpation.  She has some tenderness on medial and lateral compression of the distal phalanx.  This prompted radiographs radiographs taken today do not demonstrate any type of osseous abnormalities no acute fractures no healing fractures.  Bone mineralization is good.  No signs of infection in the tissues.  Assessment: Moderate to severe contusion toe hallux left.  This completely removed her toenail.  Contusion bone.  Plan: Discussed etiology pathology conservative versus surgical therapies we talked once again today about soaking this Epsom salts warm water or Betadine and warm water to help take some of the soreness out of the toe.  Also will help prevent any infection that we might institute.  Did discuss appropriate shoes that might would help limit the pressure on her toe.  I did express to her that this would take quite some time to  heal and before it felt better.  Expressing to her that she should make sure that she keeps the shoe pressure off of the toes.  She will call us  with questions or concerns.

## 2023-08-15 ENCOUNTER — Ambulatory Visit: Admitting: Podiatry

## 2023-08-22 ENCOUNTER — Ambulatory Visit: Admitting: Podiatry

## 2023-08-22 DIAGNOSIS — B351 Tinea unguium: Secondary | ICD-10-CM

## 2023-08-22 NOTE — Progress Notes (Signed)
 Subjective:  Patient ID: Marcia Bowers, female    DOB: Jul 09, 1980,  MRN: [id number]  Chief Complaint  Patient presents with   Nail Problem    Nail check     43 y.o. female presents with the above complaint.  Patient presents with complaint of left hallux pain.  She states that she was on a cruise ship and stubbed it and the nail came off.  Wanted to get it evaluated denies any other acute complaints.  She would like to discuss treatment options for it.  Has not seen anyone as prior to seeing me.   Review of Systems: Negative except as noted in the HPI. Denies N/V/F/Ch.  Past Medical History:  Diagnosis Date   Abnormal Pap smear    Adult attention deficit disorder    per patient   Anemia    Bursitis    BV (bacterial vaginosis)    Chlamydia    Fibroids    Fibromyalgia    Gonorrhea    History of chicken pox    Systemic lupus erythematosus (HCC) 01/30/2016   Tendonitis    Trichomonas    Vaginal Pap smear, abnormal    Yeast infection     Current Outpatient Medications:    Acetaminophen  (TYLENOL  PO), Take by mouth as needed., Disp: , Rfl:    Calcium Carbonate (CALCIUM 500 PO), Take by mouth daily. With 5,000 units of vitamin D, Disp: , Rfl:    cyclobenzaprine  (FLEXERIL ) 10 MG tablet, Take 1 tablet (10 mg total) by mouth 2 (two) times daily as needed for muscle spasms., Disp: 20 tablet, Rfl: 0   diphenhydramine -acetaminophen  (TYLENOL  PM) 25-500 MG TABS tablet, Take 1 tablet by mouth at bedtime as needed., Disp: , Rfl:    FLUoxetine (PROZAC) 10 MG capsule, Take 1 capsule by mouth daily., Disp: , Rfl:    ibuprofen  (ADVIL ) 800 MG tablet, Take 1 tablet (800 mg total) by mouth 3 (three) times daily. (Patient taking differently: Take 800 mg by mouth as needed.), Disp: 21 tablet, Rfl: 0   levOCARNitine (CARNITINE PO), Take by mouth as needed. (Patient not taking: Reported on 04/25/2023), Disp: , Rfl:    MAGNESIUM PO, Take by mouth daily. (Patient not taking: Reported on 04/25/2023), Disp:  , Rfl:    oxyCODONE -acetaminophen  (PERCOCET/ROXICET) 5-325 MG tablet, Take 1 tablet by mouth every 6 (six) hours as needed (pain scale 4-7)., Disp: 15 tablet, Rfl: 0   Probiotic Product (PROBIOTIC PO), Take by mouth daily., Disp: , Rfl:    VITAMIN D PO, Take 5,000 Units by mouth. With vitamin K, Disp: , Rfl:   Social History   Tobacco Use  Smoking Status Never   Passive exposure: Never  Smokeless Tobacco Never    No Known Allergies Objective:  There were no vitals filed for this visit. There is no height or weight on file to calculate BMI. Constitutional Well developed. Well nourished.  Vascular Dorsalis pedis pulses palpable bilaterally. Posterior tibial pulses palpable bilaterally. Capillary refill normal to all digits.  No cyanosis or clubbing noted. Pedal hair growth normal.  Neurologic Normal speech. Oriented to person, place, and time. Epicritic sensation to light touch grossly present bilaterally.  Dermatologic Nails hallux nail removed the underlying nail base appears to be in good healthy condition.  No open wounds or lesion noted no signs of infection noted  Orthopedic: Normal joint ROM without pain or crepitus bilaterally. No visible deformities. No bony tenderness.   Radiographs: None Assessment:   1. Onychomycosis due to dermatophyte  Plan:  Patient was evaluated and treated and all questions answered.  Left hallux total nail avulsion with a history of acute trauma - All questions and concerns were discussed with the patient in extensive detail at this time it is healing well.  I encouraged her to do Betadine wet-to-dry dressing for next 3 weeks which will help heal appropriately.  If there is no improvement she will come back and see me she states understanding.  No follow-ups on file.

## 2023-09-24 ENCOUNTER — Ambulatory Visit (INDEPENDENT_AMBULATORY_CARE_PROVIDER_SITE_OTHER): Admitting: Podiatry

## 2023-09-24 DIAGNOSIS — B351 Tinea unguium: Secondary | ICD-10-CM

## 2023-09-24 DIAGNOSIS — S90212A Contusion of left great toe with damage to nail, initial encounter: Secondary | ICD-10-CM | POA: Diagnosis not present

## 2023-09-24 NOTE — Progress Notes (Signed)
 Subjective:  Patient ID: Marcia Bowers, female    DOB: 1980-03-01,  MRN: [id number]  Chief Complaint  Patient presents with   Nail Problem    Pt stated that the toe is doing better than it was she stated that it is still a little tender     43 y.o. female presents with the above complaint.  Patient presents for follow-up of left hallux nail pain.  She states that she is doing well.  Denies any other acute complaints Review of Systems: Negative except as noted in the HPI. Denies N/V/F/Ch.  Past Medical History:  Diagnosis Date   Abnormal Pap smear    Adult attention deficit disorder    per patient   Anemia    Bursitis    BV (bacterial vaginosis)    Chlamydia    Fibroids    Fibromyalgia    Gonorrhea    History of chicken pox    Systemic lupus erythematosus (HCC) 01/30/2016   Tendonitis    Trichomonas    Vaginal Pap smear, abnormal    Yeast infection     Current Outpatient Medications:    Acetaminophen  (TYLENOL  PO), Take by mouth as needed., Disp: , Rfl:    Calcium Carbonate (CALCIUM 500 PO), Take by mouth daily. With 5,000 units of vitamin D, Disp: , Rfl:    cyclobenzaprine  (FLEXERIL ) 10 MG tablet, Take 1 tablet (10 mg total) by mouth 2 (two) times daily as needed for muscle spasms., Disp: 20 tablet, Rfl: 0   diphenhydramine -acetaminophen  (TYLENOL  PM) 25-500 MG TABS tablet, Take 1 tablet by mouth at bedtime as needed., Disp: , Rfl:    FLUoxetine (PROZAC) 10 MG capsule, Take 1 capsule by mouth daily., Disp: , Rfl:    ibuprofen  (ADVIL ) 800 MG tablet, Take 1 tablet (800 mg total) by mouth 3 (three) times daily. (Patient taking differently: Take 800 mg by mouth as needed.), Disp: 21 tablet, Rfl: 0   levOCARNitine (CARNITINE PO), Take by mouth as needed. (Patient not taking: Reported on 04/25/2023), Disp: , Rfl:    MAGNESIUM PO, Take by mouth daily. (Patient not taking: Reported on 04/25/2023), Disp: , Rfl:    oxyCODONE -acetaminophen  (PERCOCET/ROXICET) 5-325 MG tablet, Take 1 tablet  by mouth every 6 (six) hours as needed (pain scale 4-7)., Disp: 15 tablet, Rfl: 0   Probiotic Product (PROBIOTIC PO), Take by mouth daily., Disp: , Rfl:    VITAMIN D PO, Take 5,000 Units by mouth. With vitamin K, Disp: , Rfl:   Social History   Tobacco Use  Smoking Status Never   Passive exposure: Never  Smokeless Tobacco Never    No Known Allergies Objective:  There were no vitals filed for this visit. There is no height or weight on file to calculate BMI. Constitutional Well developed. Well nourished.  Vascular Dorsalis pedis pulses palpable bilaterally. Posterior tibial pulses palpable bilaterally. Capillary refill normal to all digits.  No cyanosis or clubbing noted. Pedal hair growth normal.  Neurologic Normal speech. Oriented to person, place, and time. Epicritic sensation to light touch grossly present bilaterally.  Dermatologic Nails hallux nail removed the underlying nail base appears to be in good healthy condition.  No open wounds or lesion noted no signs of infection noted  Orthopedic: Normal joint ROM without pain or crepitus bilaterally. No visible deformities. No bony tenderness.   Radiographs: None Assessment:   No diagnosis found.  Plan:  Patient was evaluated and treated and all questions answered.  Left hallux total nail avulsion with a history  of acute trauma - Clinically doing well completely healed at this time patient will return to regular shoes no restrictions patien nothing in the left t can stop Betadine wet-to-dry dressing.  Incision has completely epithelialized for 3

## 2023-10-13 NOTE — Progress Notes (Signed)
 Office Visit Note  Patient: Marcia Bowers             Date of Birth: 1980/09/11           MRN: [id number]             PCP: Jacques Camie Pepper, PA-C Referring: Jacques Camie Pepper SHAUNNA* Visit Date: 10/27/2023 Occupation: Data Unavailable  Subjective:  Facial rash   History of Present Illness: Marcia Bowers is a 43 y.o. female with history of myofascial pain and osteoarthritis.  Patient presents today with several new concerns.  Patient states over the past 1-1/2 to 2 years she has been experiencing intermittent facial rashes.  Patient states that she noticed most recently while on a cruise that she had some facial swelling, irritation, and itching after sun exposure.  She denies any other rashes.  She denies any signs of alopecia.  She denies any oral nasal ulcerations.  She has occasional dry eyes especially if she takes Flexeril .  She continues to experience interval myalgias and muscle tenderness due to myofascial pain.  She primarily takes Tylenol  or ibuprofen  as needed for symptomatic relief.  Patient experiences intermittent symptoms of Raynaud's phenomenon affecting her fingertips and toes.  She denies any digital ulcerations.  Patient states that she has noticed increased fatigue.  Patient is unsure if the fatigue is worsened due to being perimenopausal but she states that her stamina has been affected.  Patient states that she has been unable to perform as many activities that she would like to during the day without having to take a break to rest.  She denies any shortness of breath.  Patient states that while reports she lost her left great toenail.  Patient states that she has had delayed healing which she attributes to delayed circulation as well as due to fibromyalgia. She continues to have quite a bit of sensitivity involving the left great toe to the point that she is unable to wear close that shoes.   Activities of Daily Living:  Patient reports morning stiffness for 30  minutes.   Patient Reports nocturnal pain.  Difficulty dressing/grooming: Denies Difficulty climbing stairs: Denies Difficulty getting out of chair: Denies Difficulty using hands for taps, buttons, cutlery, and/or writing: Reports  Review of Systems  Constitutional:  Positive for fatigue.  HENT:  Negative for mouth sores and mouth dryness.   Eyes:  Negative for dryness.  Respiratory:  Negative for shortness of breath.   Cardiovascular:  Negative for chest pain and palpitations.  Gastrointestinal:  Positive for constipation. Negative for blood in stool and diarrhea.  Endocrine: Positive for increased urination.  Genitourinary:  Negative for involuntary urination.  Musculoskeletal:  Positive for joint pain, joint pain, joint swelling, myalgias, morning stiffness, muscle tenderness and myalgias. Negative for gait problem and muscle weakness.  Skin:  Positive for rash and sensitivity to sunlight. Negative for color change and hair loss.  Allergic/Immunologic: Negative for susceptible to infections.  Neurological:  Negative for dizziness and headaches.  Hematological:  Negative for swollen glands.  Psychiatric/Behavioral:  Positive for depressed mood and sleep disturbance. The patient is not nervous/anxious.     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   Low maternal weight  gain 07/08/2017   Herpes simplex 04/05/2017   Anemia 03/24/2017   Pregnancy 03/20/2017   Primary osteoarthritis of both knees 11/05/2016   Myofascial pain syndrome 03/19/2016   Other chronic pain 03/19/2016   Other fatigue 03/19/2016   Myalgia 03/19/2016   Pain of both hip joints 03/19/2016   Arthritis, senescent 03/19/2016    Arthralgia of both knees 03/19/2016   History of vitamin D deficiency 03/19/2016   Systemic lupus erythematosus (HCC) 01/30/2016   History of shingles 07/19/2013   BV (bacterial vaginosis)    Yeast infection    Trichomoniasis    Chlamydia    Gonorrhea    Fibroids    Umbilical hernia 01/24/2011   UNSPECIFIED VITAMIN D DEFICIENCY 06/03/2008   ABNORMAL PAP SMEAR, LGSIL 08/21/2007   BACK PAIN, LUMBAR 07/30/2007   BACK PAIN, UPPER 07/30/2007   MIGRAINE HEADACHE 07/02/2007   DENTAL CARIES 07/02/2007   LEG PAIN, CHRONIC 07/02/2007    Past Medical History:  Diagnosis Date   Abnormal Pap smear    Adult attention deficit disorder    per patient   Anemia    Bursitis    BV (bacterial vaginosis)    Chlamydia    Fibroids    Fibromyalgia    Gonorrhea    History of chicken pox    Systemic lupus erythematosus (HCC) 01/30/2016   Tendonitis    Trichomonas    Vaginal Pap smear, abnormal    Yeast infection     Family History  Problem Relation Age of Onset   Thyroid disease Mother    Hypertension Father    Heart disease Other        Heart on the right   Heart disease Paternal Grandfather    Hypertension Paternal Grandfather    Diabetes Paternal Grandfather    Heart disease Paternal Grandmother    Hypertension Paternal Grandmother    Healthy Sister    Healthy Brother    Healthy Daughter    Healthy Daughter    Anesthesia problems Neg Hx    Past Surgical History:  Procedure Laterality Date   CESAREAN SECTION  04/13/2011   Procedure: CESAREAN SECTION;  Surgeon: Jon CINDERELLA Rummer, MD;  Location: [facility];  Service: Gynecology;  Laterality: N/A;  Primary cesarean section with delivery of baby girl at 1. Apgars 8/9.   CESAREAN SECTION N/A 09/19/2017   Procedure: CESAREAN SECTION;  Surgeon: Bettina Muskrat, MD;  Location: [facility];  Service: Obstetrics;  Laterality: N/A;   INSERTION OF MESH N/A 10/24/2017   Procedure: INSERTION OF MESH;  Surgeon: Curvin Deward MOULD, MD;  Location: [facility];  Service: General;  Laterality: N/A;   TUBAL LIGATION Bilateral 09/19/2017   Procedure: BILATERAL TUBAL LIGATION;  Surgeon: Bettina Muskrat, MD;  Location: [facility];  Service: Obstetrics;  Laterality: Bilateral;   UMBILICAL HERNIA REPAIR N/A 10/24/2017   Procedure: UMBILICAL HERNIA REPAIR ERAS PATHWAY;  Surgeon: Curvin Deward MOULD, MD;  Location: [facility];  Service: General;  Laterality: N/A;   WISDOM TOOTH EXTRACTION     Social History   Tobacco Use   Smoking status: Never    Passive exposure: Never   Smokeless tobacco: Never  Vaping Use   Vaping status: Never Used  Substance Use Topics   Alcohol use: Yes    Comment: socially   Drug use: No   Social History   Social History Narrative   Not on file     Immunization History  Administered Date(s) Administered  DTP 06/04/1979, 08/19/1979, 10/09/1979, 03/10/1981, 10/08/1985   MMR 03/10/1981   OPV 06/04/1979, 08/19/1979, 10/09/1979, 03/10/1981, 10/08/1985   PPD Test 02/15/2015, 02/21/2015, 11/06/2015   Td 07/30/2007   Td (Adult),unspecified 04/28/1998   Tdap 05/28/2003, 04/16/2011     Objective: Vital Signs: BP 116/78   Pulse 85   Temp 98 F (36.7 C)   Resp 14   Ht 5' 3 (1.6 m)   Wt 148 lb 3.2 oz (67.2 kg)   LMP 10/05/2023   BMI 26.25 kg/m    Physical Exam Vitals and nursing note reviewed.  Constitutional:      Appearance: She is well-developed.  HENT:     Head: Normocephalic and atraumatic.  Eyes:     Conjunctiva/sclera: Conjunctivae normal.  Cardiovascular:     Rate and Rhythm: Normal rate and regular rhythm.     Heart sounds: Normal heart sounds.  Pulmonary:     Effort: Pulmonary effort is normal.     Breath sounds: Normal breath sounds.  Abdominal:     General: Bowel sounds are normal.     Palpations: Abdomen is soft.  Musculoskeletal:     Cervical back: Normal range of motion.  Lymphadenopathy:     Cervical: No cervical adenopathy.  Skin:    General: Skin  is warm and dry.     Capillary Refill: Capillary refill takes less than 2 seconds.  Neurological:     Mental Status: She is alert and oriented to person, place, and time.  Psychiatric:        Behavior: Behavior normal.      Musculoskeletal Exam: C-spine, thoracic spine, lumbar spine have good range of motion.  No midline spinal tenderness.  No SI joint tenderness.  Shoulder joints, elbow joints, wrist joints, MCPs, PIPs, DIPs have good range of motion with no synovitis.  Complete fist formation bilaterally.  Hip joints have good range of motion with no groin pain.  Knee joints have good range of motion no warmth or effusion.  Ankle joints have good range of motion no tenderness or joint swelling.  No evidence of Achilles tendinitis or plantar fasciitis. Left great toe wrapped in a bandage.    CDAI Exam: CDAI Score: -- Patient Global: --; Provider Global: -- Swollen: --; Tender: -- Joint Exam 10/27/2023   No joint exam has been documented for this visit   There is currently no information documented on the homunculus. Go to the Rheumatology activity and complete the homunculus joint exam.  Investigation: No additional findings.  Imaging: No results found.  Recent Labs: Lab Results  Component Value Date   WBC 11.4 (H) 11/01/2022   HGB 13.1 11/01/2022   PLT 375 11/01/2022   NA 138 11/01/2022   K 4.3 11/01/2022   CL 101 11/01/2022   CO2 26 11/01/2022   GLUCOSE 93 11/01/2022   BUN 5 (L) 11/01/2022   CREATININE 0.82 11/01/2022   BILITOT 0.9 03/21/2016   ALKPHOS 39 03/21/2016   AST 18 03/21/2016   ALT 12 03/21/2016   PROT 6.9 03/21/2016   ALBUMIN 3.9 03/21/2016   CALCIUM 9.9 11/01/2022   GFRAA >89 03/21/2016    Speciality Comments: No specialty comments available.  Procedures:  No procedures performed Allergies: Patient has no known allergies.   Assessment / Plan:     Visit Diagnoses: Myofascial pain syndrome: Patient continues to experience intermittent myalgias  and muscle tenderness due to myofascial pain syndrome.  She experiences intermittent bouts of fatigue as well.  She takes Flexeril  very sparingly  for symptomatic relief during flares.  She primarily takes Tylenol  and ibuprofen  as needed for symptomatic relief. Discussed the importance of regular exercise and good sleep regimen.  Other chronic pain: She takes ibuprofen  and Tylenol  as needed for symptomatic relief.  Other fatigue -Chronic-Patient presents today experiencing increased fatigue.  Patient has had to take more breaks to rest while trying to complete activities due to the level of fatigue.  Plan to check the following lab work today.   Plan: CBC with Differential/Platelet, Urinalysis, Routine w reflex microscopic, ANA, C3 and C4, Sedimentation rate, Comprehensive metabolic panel with GFR, Anti-DNA antibody, double-stranded, Anti-Smith antibody, RNP Antibody, Sjogrens syndrome-A extractable nuclear antibody, Sjogrens syndrome-B extractable nuclear antibody, Anti-scleroderma antibody  Facial rash - 1.5-2 years-intermittent-exacerbated by sun exposure.  During times of sun exposure she will notice more inflammation on her face as well as an itching and irritation.  She was concerned about a rash due to lupus.  Plan to check the following lab work today.  Plan: CBC with Differential/Platelet, Urinalysis, Routine w reflex microscopic, ANA, C3 and C4, Sedimentation rate, Comprehensive metabolic panel with GFR, Anti-DNA antibody, double-stranded, Anti-Smith antibody, RNP Antibody, Sjogrens syndrome-A extractable nuclear antibody, Sjogrens syndrome-B extractable nuclear antibody, Anti-scleroderma antibody  Photosensitivity -Longstanding history of photosensitivity.  Plan to update the following lab work today for further evaluation.   Plan: CBC with Differential/Platelet, Urinalysis, Routine w reflex microscopic, ANA, C3 and C4, Sedimentation rate, Comprehensive metabolic panel with GFR, Anti-DNA antibody,  double-stranded, Anti-Smith antibody, RNP Antibody, Sjogrens syndrome-A extractable nuclear antibody, Sjogrens syndrome-B extractable nuclear antibody, Anti-scleroderma antibody  Raynaud's syndrome without gangrene -Family history of raynaud's-mother.   She has intermittent symptoms of raynaud's.  No digital ulcers.  No signs of sclerodactyly. Plan to obtain the following lab work today.  Plan: CBC with Differential/Platelet, Urinalysis, Routine w reflex microscopic, ANA, C3 and C4, Sedimentation rate, Comprehensive metabolic panel with GFR, Anti-DNA antibody, double-stranded, Anti-Smith antibody, RNP Antibody, Sjogrens syndrome-A extractable nuclear antibody, Sjogrens syndrome-B extractable nuclear antibody, Anti-scleroderma antibody  Vitamin D deficiency - Plan to check vitamin D today.  Plan: VITAMIN D 25 Hydroxy (Vit-D Deficiency, Fractures)  Lateral epicondylitis of both elbows: Tenderness upon palpation noted today.   Primary osteoarthritis of both knees: She has good ROM of both knees but no warmth or effusion noted today.   Trochanteric bursitis of both hips: Intermittent discomfort-exacerbated by side sleeping. Discussed the importance of performing exercises daily.   Chronic SI joint pain -Right SI joint cortisone injection performed on 10/22/2022  Plantar fasciitis, bilateral: Not currently symptomatic.   Other medical conditions are listed as follows:   History of migraine    Orders: Orders Placed This Encounter  Procedures   CBC with Differential/Platelet   Urinalysis, Routine w reflex microscopic   ANA   C3 and C4   Sedimentation rate   Comprehensive metabolic panel with GFR   Anti-DNA antibody, double-stranded   Anti-Smith antibody   RNP Antibody   Sjogrens syndrome-A extractable nuclear antibody   Sjogrens syndrome-B extractable nuclear antibody   Anti-scleroderma antibody   VITAMIN D 25 Hydroxy (Vit-D Deficiency, Fractures)   No orders of the defined types were  placed in this encounter.    Follow-Up Instructions: Return in about 6 months (around 04/26/2024) for Osteoarthritis.   Waddell CHRISTELLA Craze, PA-C  Note - This record has been created using Dragon software.  Chart creation errors have been sought, but may not always  have been located. Such creation errors do not reflect on  the  standard of medical care.

## 2023-10-27 ENCOUNTER — Ambulatory Visit: Attending: Physician Assistant | Admitting: Physician Assistant

## 2023-10-27 ENCOUNTER — Encounter: Payer: Self-pay | Admitting: Physician Assistant

## 2023-10-27 VITALS — BP 116/78 | HR 85 | Temp 98.0°F | Resp 14 | Ht 63.0 in | Wt 148.2 lb

## 2023-10-27 DIAGNOSIS — R5383 Other fatigue: Secondary | ICD-10-CM | POA: Insufficient documentation

## 2023-10-27 DIAGNOSIS — M7918 Myalgia, other site: Secondary | ICD-10-CM | POA: Insufficient documentation

## 2023-10-27 DIAGNOSIS — I7300 Raynaud's syndrome without gangrene: Secondary | ICD-10-CM | POA: Diagnosis not present

## 2023-10-27 DIAGNOSIS — M170 Bilateral primary osteoarthritis of knee: Secondary | ICD-10-CM | POA: Insufficient documentation

## 2023-10-27 DIAGNOSIS — M7711 Lateral epicondylitis, right elbow: Secondary | ICD-10-CM | POA: Insufficient documentation

## 2023-10-27 DIAGNOSIS — G8929 Other chronic pain: Secondary | ICD-10-CM | POA: Diagnosis not present

## 2023-10-27 DIAGNOSIS — E559 Vitamin D deficiency, unspecified: Secondary | ICD-10-CM | POA: Insufficient documentation

## 2023-10-27 DIAGNOSIS — R21 Rash and other nonspecific skin eruption: Secondary | ICD-10-CM | POA: Diagnosis not present

## 2023-10-27 DIAGNOSIS — L568 Other specified acute skin changes due to ultraviolet radiation: Secondary | ICD-10-CM | POA: Diagnosis not present

## 2023-10-27 DIAGNOSIS — M7712 Lateral epicondylitis, left elbow: Secondary | ICD-10-CM | POA: Diagnosis not present

## 2023-10-27 DIAGNOSIS — M7061 Trochanteric bursitis, right hip: Secondary | ICD-10-CM | POA: Insufficient documentation

## 2023-10-27 DIAGNOSIS — M7062 Trochanteric bursitis, left hip: Secondary | ICD-10-CM | POA: Diagnosis not present

## 2023-10-27 DIAGNOSIS — Z8639 Personal history of other endocrine, nutritional and metabolic disease: Secondary | ICD-10-CM

## 2023-10-27 DIAGNOSIS — M722 Plantar fascial fibromatosis: Secondary | ICD-10-CM | POA: Diagnosis not present

## 2023-10-27 DIAGNOSIS — M533 Sacrococcygeal disorders, not elsewhere classified: Secondary | ICD-10-CM | POA: Diagnosis not present

## 2023-10-27 DIAGNOSIS — Z8669 Personal history of other diseases of the nervous system and sense organs: Secondary | ICD-10-CM | POA: Insufficient documentation

## 2023-10-28 ENCOUNTER — Ambulatory Visit: Payer: Self-pay | Admitting: Physician Assistant

## 2023-10-28 NOTE — Progress Notes (Signed)
 CBC and CMP WNL Vitamin D WNL ESR WNL UA normal

## 2023-10-29 LAB — URINALYSIS, ROUTINE W REFLEX MICROSCOPIC
Bilirubin Urine: NEGATIVE
Glucose, UA: NEGATIVE
Hgb urine dipstick: NEGATIVE
Ketones, ur: NEGATIVE
Leukocytes,Ua: NEGATIVE
Nitrite: NEGATIVE
Protein, ur: NEGATIVE
Specific Gravity, Urine: 1.011 (ref 1.001–1.035)
pH: 5.5 (ref 5.0–8.0)

## 2023-10-29 LAB — COMPREHENSIVE METABOLIC PANEL WITH GFR
AG Ratio: 1.4 (calc) (ref 1.0–2.5)
ALT: 7 U/L (ref 6–29)
AST: 12 U/L (ref 10–30)
Albumin: 4 g/dL (ref 3.6–5.1)
Alkaline phosphatase (APISO): 47 U/L (ref 31–125)
BUN: 10 mg/dL (ref 7–25)
CO2: 25 mmol/L (ref 20–32)
Calcium: 9.4 mg/dL (ref 8.6–10.2)
Chloride: 106 mmol/L (ref 98–110)
Creat: 0.81 mg/dL (ref 0.50–0.99)
Globulin: 2.8 g/dL (ref 1.9–3.7)
Glucose, Bld: 75 mg/dL (ref 65–99)
Potassium: 4 mmol/L (ref 3.5–5.3)
Sodium: 139 mmol/L (ref 135–146)
Total Bilirubin: 0.9 mg/dL (ref 0.2–1.2)
Total Protein: 6.8 g/dL (ref 6.1–8.1)
eGFR: 92 mL/min/1.73m2 (ref >=60)

## 2023-10-29 LAB — CBC WITH DIFFERENTIAL/PLATELET
Absolute Lymphocytes: 2451 {cells}/uL (ref 850–3900)
Absolute Monocytes: 628 {cells}/uL (ref 200–950)
Basophils Absolute: 60 {cells}/uL (ref 0–200)
Basophils Relative: 0.7 %
Eosinophils Absolute: 77 {cells}/uL (ref 15–500)
Eosinophils Relative: 0.9 %
HCT: 37.7 % (ref 35.0–45.0)
Hemoglobin: 12.1 g/dL (ref 11.7–15.5)
MCH: 29.4 pg (ref 27.0–33.0)
MCHC: 32.1 g/dL (ref 32.0–36.0)
MCV: 91.5 fL (ref 80.0–100.0)
MPV: 10.8 fL (ref 7.5–12.5)
Monocytes Relative: 7.3 %
Neutro Abs: 5384 {cells}/uL (ref 1500–7800)
Neutrophils Relative %: 62.6 %
Platelets: 309 Thousand/uL (ref 140–400)
RBC: 4.12 Million/uL (ref 3.80–5.10)
RDW: 11.6 % (ref 11.0–15.0)
Total Lymphocyte: 28.5 %
WBC: 8.6 Thousand/uL (ref 3.8–10.8)

## 2023-10-29 LAB — ANTI-SCLERODERMA ANTIBODY: Scleroderma (Scl-70) (ENA) Antibody, IgG: <1 AI

## 2023-10-29 LAB — C3 AND C4
C3 Complement: 139 mg/dL (ref 83–193)
C4 Complement: 17 mg/dL (ref 15–57)

## 2023-10-29 LAB — VITAMIN D 25 HYDROXY (VIT D DEFICIENCY, FRACTURES): Vit D, 25-Hydroxy: 60 ng/mL (ref 30–100)

## 2023-10-29 LAB — ANTI-DNA ANTIBODY, DOUBLE-STRANDED: ds DNA Ab: <1 [IU]/mL

## 2023-10-29 LAB — SJOGRENS SYNDROME-A EXTRACTABLE NUCLEAR ANTIBODY: SSA (Ro) (ENA) Antibody, IgG: <1 AI

## 2023-10-29 LAB — ANTI-SMITH ANTIBODY: ENA SM Ab Ser-aCnc: <1 AI

## 2023-10-29 LAB — ANA: Anti Nuclear Antibody (ANA): NEGATIVE

## 2023-10-29 LAB — RNP ANTIBODY: Ribonucleic Protein(ENA) Antibody, IgG: 1.3 AI — AB

## 2023-10-29 LAB — SEDIMENTATION RATE: Sed Rate: 11 mm/h (ref 0–20)

## 2023-10-29 LAB — SJOGRENS SYNDROME-B EXTRACTABLE NUCLEAR ANTIBODY: SSB (La) (ENA) Antibody, IgG: <1 AI

## 2023-10-29 NOTE — Progress Notes (Signed)
 ANA is negative-good news!  RNP is borderline positive-unlikely this is clinically significant since rest of workup is negative.   We can recheck at next visit.   Rest of panel unremarkable.

## 2024-01-21 ENCOUNTER — Encounter: Payer: Self-pay | Admitting: Emergency Medicine

## 2024-01-21 ENCOUNTER — Ambulatory Visit
Admission: EM | Admit: 2024-01-21 | Discharge: 2024-01-21 | Disposition: A | Discharge status: Home / Self Care | Source: Home / Self Care

## 2024-01-21 DIAGNOSIS — R202 Paresthesia of skin: Secondary | ICD-10-CM | POA: Diagnosis not present

## 2024-01-21 NOTE — ED Provider Notes (Signed)
 " [facility]  Note:  This document was prepared using Dragon voice recognition software and may include unintentional dictation errors.  MRN: [id number] DOB: 1980-04-08  Subjective:   Marcia Bowers is a 43 y.o. female presenting for left-sided facial tension x 2 weeks.  Patient reports that she also developed a headache this morning when she woke up.  Patient is fully alert and oriented has no facial droop, altered mental status, shortness of breath, chest pain, weakness, dizziness.  Patient states that she was feeling a little bit lightheaded earlier.  Denies any known sick contacts.  No recent head trauma or injury.  Patient reports she had a telehealth visit with her primary care today who advised her to come to urgent care for evaluation because of the symptoms she was encountering.  Current Medications[1]   Allergies[2]  Past Medical History:  Diagnosis Date   Abnormal Pap smear    Adult attention deficit disorder    per patient   Anemia    Bursitis    BV (bacterial vaginosis)    Chlamydia    Fibroids    Fibromyalgia    Gonorrhea    History of chicken pox    Systemic lupus erythematosus (HCC) 01/30/2016   Tendonitis    Trichomonas    Vaginal Pap smear, abnormal    Yeast infection      Past Surgical History:  Procedure Laterality Date   CESAREAN SECTION  04/13/2011   Procedure: CESAREAN SECTION;  Surgeon: Jon CINDERELLA Rummer, MD;  Location: [facility];  Service: Gynecology;  Laterality: N/A;  Primary cesarean section with delivery of baby girl at 31. Apgars 8/9.   CESAREAN SECTION N/A 09/19/2017   Procedure: CESAREAN SECTION;  Surgeon: Bettina Muskrat, MD;  Location: [facility];  Service: Obstetrics;  Laterality: N/A;   INSERTION OF MESH N/A 10/24/2017   Procedure: INSERTION OF MESH;  Surgeon: Curvin Deward MOULD, MD;  Location: [facility];  Service: General;  Laterality: N/A;   TUBAL LIGATION Bilateral 09/19/2017   Procedure: BILATERAL TUBAL  LIGATION;  Surgeon: Bettina Muskrat, MD;  Location: [facility];  Service: Obstetrics;  Laterality: Bilateral;   UMBILICAL HERNIA REPAIR N/A 10/24/2017   Procedure: UMBILICAL HERNIA REPAIR ERAS PATHWAY;  Surgeon: Curvin Deward MOULD, MD;  Location:  SURGERY CENTER;  Service: General;  Laterality: N/A;   WISDOM TOOTH EXTRACTION      Family History  Problem Relation Age of Onset   Thyroid disease Mother    Hypertension Father    Heart disease Other        Heart on the right   Heart disease Paternal Grandfather    Hypertension Paternal Grandfather    Diabetes Paternal Grandfather    Heart disease Paternal Grandmother    Hypertension Paternal Grandmother    Healthy Sister    Healthy Brother    Healthy Daughter    Healthy Daughter    Anesthesia problems Neg Hx     Social History[3]  ROS Refer to HPI for ROS details.  Objective:    Vitals: BP 124/82 (BP Location: Left Arm)   Pulse 97   Temp 98.3 F (36.8 C) (Oral)   Resp 16   Ht 5' 3 (1.6 m)   Wt 142 lb (64.4 kg)   LMP 01/20/2024 (Exact Date)   SpO2 96%   BMI 25.15 kg/m   Physical Exam Vitals and nursing note reviewed.  Constitutional:      General: She is not in acute distress.  Appearance: Normal appearance. She is well-developed. She is not ill-appearing or toxic-appearing.  HENT:     Head: Normocephalic and atraumatic.     Nose: Nose normal. No congestion or rhinorrhea.     Mouth/Throat:     Mouth: Mucous membranes are moist.     Pharynx: Oropharynx is clear. No posterior oropharyngeal erythema.  Eyes:     General:        Right eye: No discharge.        Left eye: No discharge.     Extraocular Movements: Extraocular movements intact.     Conjunctiva/sclera: Conjunctivae normal.     Pupils: Pupils are equal, round, and reactive to light.  Cardiovascular:     Rate and Rhythm: Normal rate.  Pulmonary:     Effort: Pulmonary effort is normal. No respiratory distress.     Breath sounds: No stridor.  No wheezing.  Skin:    General: Skin is warm and dry.  Neurological:     General: No focal deficit present.     Mental Status: She is alert and oriented to person, place, and time.     Cranial Nerves: No cranial nerve deficit.     Sensory: No sensory deficit.     Motor: No weakness.     Coordination: Coordination normal.     Gait: Gait normal.  Psychiatric:        Mood and Affect: Mood normal.        Behavior: Behavior normal.     Procedures  No results found for this or any previous visit (from the past 24 hours).  Assessment and Plan :     Discharge Instructions       1. Facial paresthesia (Primary) - ED EKG completed in UC shows normal sinus rhythm, no sign of STEMI, normal EKG. - Cranial nerves intact, no sign of facial droop, eyelid droop, muscle weakness, normal eye movements to bilateral eyes.  Minimal concern for CVA. - Patient advised to follow-up with primary care provider for further evaluation and management of symptoms should they continue.  -Continue to monitor symptoms for any change in severity if there is any escalation of current symptoms or development of new symptoms follow-up in ER for further evaluation and management.       Percy Winterrowd B Lynda Capistran    [1] No current facility-administered medications for this encounter.  Current Outpatient Medications:    Acetaminophen  (TYLENOL  PO), Take by mouth as needed., Disp: , Rfl:    Calcium Carbonate (CALCIUM 500 PO), Take by mouth daily. With 5,000 units of vitamin D  (Patient not taking: Reported on 10/27/2023), Disp: , Rfl:    cyclobenzaprine  (FLEXERIL ) 10 MG tablet, Take 1 tablet (10 mg total) by mouth 2 (two) times daily as needed for muscle spasms., Disp: 20 tablet, Rfl: 0   diphenhydramine -acetaminophen  (TYLENOL  PM) 25-500 MG TABS tablet, Take 1 tablet by mouth at bedtime as needed., Disp: , Rfl:    FLUoxetine (PROZAC) 10 MG capsule, Take 1 capsule by mouth daily. (Patient not taking: Reported on  10/27/2023), Disp: , Rfl:    ibuprofen  (ADVIL ) 800 MG tablet, Take 1 tablet (800 mg total) by mouth 3 (three) times daily. (Patient taking differently: Take 800 mg by mouth as needed.), Disp: 21 tablet, Rfl: 0   levOCARNitine (CARNITINE PO), Take by mouth as needed. (Patient not taking: Reported on 10/27/2023), Disp: , Rfl:    MAGNESIUM PO, Take by mouth daily. (Patient not taking: Reported on 10/27/2023), Disp: , Rfl:  oxyCODONE -acetaminophen  (PERCOCET/ROXICET) 5-325 MG tablet, Take 1 tablet by mouth every 6 (six) hours as needed (pain scale 4-7)., Disp: 15 tablet, Rfl: 0   Probiotic Product (PROBIOTIC PO), Take by mouth daily., Disp: , Rfl:    traMADol (ULTRAM) 50 MG tablet, Take 50 mg by mouth every 6 (six) hours as needed., Disp: , Rfl:    VITAMIN D  PO, Take 5,000 Units by mouth. With vitamin K, Disp: , Rfl:  [2] No Known Allergies [3]  Social History Tobacco Use   Smoking status: Never    Passive exposure: Never   Smokeless tobacco: Never  Vaping Use   Vaping status: Never Used  Substance Use Topics   Alcohol use: Yes    Comment: socially   Drug use: No     Aurea Ethel NOVAK, NP 01/21/24 2009  "

## 2024-01-21 NOTE — Progress Notes (Signed)
 [facility] Video Visit   Patient ID:  GLEMA TAKAKI is a 43 y.o. (DOB 02/04/1980) female Place of service: patient home Patient has been advised as to the limitations and limited nature of physical exam due to nature of a video visit, the possibility of privacy risk in the use of a video visit, and that the healthcare provider may recommend visiting a healthcare clinic for in-person care and follow up.  Video Visit Assessment and Plan   1. Intractable headache, unspecified chronicity pattern, unspecified headache type (Primary) 2. Neuralgic facial pain 3. Tingling of face   Unclear etiology of symptoms recommend to urgent care in the emergency room for further evaluation I do not feel that I can fully evaluate on a video visit patient is aware and agrees with plan  Patient verbalized to me that they understood what their problem is, what they need to do about it and why it is important that they do it. I have reviewed the information contained in this note and personally verified its accuracy.  I obtained the history of present illness and personally performed the physical exam Voice recognition software was used in creation of this note . Despite my best efforts at editing the text, some misspellings / errors  may have occurred.      Outpatient Encounter Medications as of 01/21/2024:    acetaminophen  (TYLENOL ) 325 mg tablet, Take one tablet (325 mg dose) by mouth as needed for Pain.   Ascorbic Acid (VITAMIN C PO), Take by mouth daily at 6 (six) am.   Cholecalciferol (VITAMIN D ) 125 MCG (5000 UT) CAPS, Take by mouth.   Docusate Sodium  (DSS) 100 MG CAPS, Take three capsules (300 mg dose) by mouth.   [DISCONTINUED] FLUoxetine (PROZAC) 10 mg capsule, Take 1 capsule by mouth once daily   PROBIOTIC PRODUCT PO, Take by mouth daily.   valacyclovir (VALTREX) 1000 mg tablet, Take one tablet (1,000 mg dose) by mouth 3 (three) times a day.   Risk, benefits, and alternatives were  provided through patient instructions given to the patient electronically and during the video interaction.  If any worsening symptoms or lack of improvement, the patient will seek immediate medical care.   Video Visit History  No chief complaint on file.    HPI   Patient is here for video visit she is at home and on the office Patient states the left side of her face is felt tight for the past 2 weeks off and on Patient feels not quite dizzy but not like herself She had a 9 out of 10 headache this morning that is now 7 out of 10 No change in vision no double vision or blurred vision Said some tingling on the left side of the face as well but no weakness Patient states she just does not feel like herself She just checked her blood pressure was 122/70  No facial droop      Reviewed and updated this visit by provider: Tobacco  Allergies  Meds  Problems  Med Hx  Surg Hx  Fam Hx        Review of Systems      Video Visit Objective Findings   Examination conducted with the use of video cameras/computer monitors. Vital signs and other aspects of physical exam are limited due to the nature of this encounter.   Physical Exam Constitutional:      General: She is not in acute distress.    Appearance: Normal appearance. She is normal  weight. She is not ill-appearing, toxic-appearing or diaphoretic.  Neurological:     Mental Status: She is alert.  Psychiatric:        Mood and Affect: Mood normal.        Behavior: Behavior normal.        Thought Content: Thought content normal.        Judgment: Judgment normal.

## 2024-01-21 NOTE — Discharge Instructions (Addendum)
" °  1. Facial paresthesia (Primary) - ED EKG completed in UC shows normal sinus rhythm, no sign of STEMI, normal EKG. - Cranial nerves intact, no sign of facial droop, eyelid droop, muscle weakness, normal eye movements to bilateral eyes.  Minimal concern for CVA. - Patient advised to follow-up with primary care provider for further evaluation and management of symptoms should they continue.  -Continue to monitor symptoms for any change in severity if there is any escalation of current symptoms or development of new symptoms follow-up in ER for further evaluation and management. "

## 2024-01-21 NOTE — ED Triage Notes (Signed)
 Pt c/o feeling like she has tension in left side of face.  Onset 2 weeks ago.  Face is symmetrical with no droop  Also st's she woke up with a headache this am. Pt alert and oriented x's 3

## 2024-04-27 ENCOUNTER — Ambulatory Visit: Admitting: Rheumatology
